# Patient Record
Sex: Female | Born: 1990 | Race: Black or African American | Hispanic: No | Marital: Single | Smoking: Never smoker
Health system: Southern US, Community
[De-identification: ages and names within clinical notes are randomized; demographics above are authoritative.]

## PROBLEM LIST (undated history)

## (undated) ENCOUNTER — Inpatient Hospital Stay (HOSPITAL_COMMUNITY): Payer: Self-pay

## (undated) DIAGNOSIS — T7840XA Allergy, unspecified, initial encounter: Secondary | ICD-10-CM

## (undated) DIAGNOSIS — O139 Gestational [pregnancy-induced] hypertension without significant proteinuria, unspecified trimester: Secondary | ICD-10-CM

## (undated) DIAGNOSIS — O09299 Supervision of pregnancy with other poor reproductive or obstetric history, unspecified trimester: Secondary | ICD-10-CM

## (undated) DIAGNOSIS — M1990 Unspecified osteoarthritis, unspecified site: Secondary | ICD-10-CM

## (undated) DIAGNOSIS — J45909 Unspecified asthma, uncomplicated: Secondary | ICD-10-CM

## (undated) DIAGNOSIS — O24419 Gestational diabetes mellitus in pregnancy, unspecified control: Secondary | ICD-10-CM

## (undated) DIAGNOSIS — G5602 Carpal tunnel syndrome, left upper limb: Secondary | ICD-10-CM

## (undated) DIAGNOSIS — E119 Type 2 diabetes mellitus without complications: Secondary | ICD-10-CM

## (undated) DIAGNOSIS — J302 Other seasonal allergic rhinitis: Secondary | ICD-10-CM

## (undated) DIAGNOSIS — A599 Trichomoniasis, unspecified: Secondary | ICD-10-CM

## (undated) DIAGNOSIS — Z8619 Personal history of other infectious and parasitic diseases: Secondary | ICD-10-CM

## (undated) HISTORY — DX: Supervision of pregnancy with other poor reproductive or obstetric history, unspecified trimester: O09.299

## (undated) HISTORY — DX: Allergy, unspecified, initial encounter: T78.40XA

## (undated) HISTORY — DX: Type 2 diabetes mellitus without complications: E11.9

## (undated) HISTORY — DX: Carpal tunnel syndrome, left upper limb: G56.02

---
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
## 2012-04-28 ENCOUNTER — Ambulatory Visit: Payer: 59 | Admitting: Internal Medicine

## 2012-04-29 ENCOUNTER — Ambulatory Visit (INDEPENDENT_AMBULATORY_CARE_PROVIDER_SITE_OTHER): Payer: 59 | Admitting: Family Medicine

## 2012-04-29 VITALS — BP 129/86 | HR 75 | Temp 98.0°F | Resp 18 | Ht 66.0 in | Wt 216.0 lb

## 2012-04-29 DIAGNOSIS — J45909 Unspecified asthma, uncomplicated: Secondary | ICD-10-CM | POA: Insufficient documentation

## 2012-04-29 MED ORDER — ALBUTEROL SULFATE (2.5 MG/3ML) 0.083% IN NEBU: 2.5000 mg | INHALATION_SOLUTION | Freq: Four times a day (QID) | RESPIRATORY_TRACT | Status: DC | PRN

## 2012-04-29 MED ORDER — ALBUTEROL SULFATE (2.5 MG/3ML) 0.083% IN NEBU
2.5000 mg | INHALATION_SOLUTION | Freq: Once | RESPIRATORY_TRACT | Status: AC
  Administered 2012-04-29: 2.5 mg via RESPIRATORY_TRACT

## 2012-04-29 MED ORDER — PREDNISONE 20 MG PO TABS: ORAL_TABLET | ORAL | Status: AC

## 2012-04-29 MED ORDER — ALBUTEROL SULFATE HFA 108 (90 BASE) MCG/ACT IN AERS: 2.0000 | INHALATION_SPRAY | Freq: Four times a day (QID) | RESPIRATORY_TRACT | Status: DC | PRN

## 2012-04-29 NOTE — Patient Instructions (Signed)
Asthma, Adult Asthma is caused by narrowing of the air passages in the lungs. It may be triggered by pollen, dust, animal dander, molds, some foods, respiratory infections, exposure to smoke, exercise, emotional stress or other allergens (things that cause allergic reactions or allergies). Repeat attacks are common. HOME CARE INSTRUCTIONS   Use prescription medications as ordered by your caregiver.   Avoid pollen, dust, animal dander, molds, smoke and other things that cause attacks at home and at work.   You may have fewer attacks if you decrease dust in your home. Electrostatic air cleaners may help.   It may help to replace your pillows or mattress with materials less likely to cause allergies.   Talk to your caregiver about an action plan for managing asthma attacks at home, including, the use of a peak flow meter which measures the severity of your asthma attack. An action plan can help minimize or stop the attack without having to seek medical care.   If you are not on a fluid restriction, drink 8 to 10 glasses of water each day.   Always have a plan prepared for seeking medical attention, including, calling your physician, accessing local emergency care, and calling 911 (in the [place] for a severe attack.   Discuss possible exercise routines with your caregiver.   If animal dander is the cause of asthma, you may need to get rid of pets.  SEEK MEDICAL CARE IF:   You have wheezing and shortness of breath even if taking medicine to prevent attacks.   You have muscle aches, chest pain or thickening of sputum.   Your sputum changes from clear or white to yellow, green, gray, or bloody.   You have any problems that may be related to the medicine you are taking (such as a rash, itching, swelling or trouble breathing).  SEEK IMMEDIATE MEDICAL CARE IF:   Your usual medicines do not stop your wheezing or there is increased coughing and/or shortness of breath.   You have increased  difficulty breathing.   You have a fever.  MAKE SURE YOU:   Understand these instructions.   Will watch your condition.   Will get help right away if you are not doing well or get worse.  Document Released: 11/04/2005 Document Revised: 10/24/2011 Document Reviewed: 06/22/2008 [facility] Patient Information 2012 [place], [place].  If you are doing well you do not need to return, but if you continue to have wheezing you need to come to be treated with some long-term medication.

## 2012-04-29 NOTE — Addendum Note (Signed)
Addended by: Mabeline Varas H on: 04/29/2012 02:07 PM   Modules accepted: Orders

## 2012-04-29 NOTE — Progress Notes (Signed)
Subjective: 21 year old lady who has been having some problems with her asthma. She had asthma when she was quite young. She has not really been on any major asthma treatments. She was doing initial evaluation for the far department with some testing which involved climbing stairs and pulling a body. She wheezing. GI pain she do for her left back around her chest this lasted a few seconds. Having off-and-on wheezing the past 2 days. Also some phlegm. She also has had a little rhinorrhea.  History of eczema  Objective: Mildly overweight Afro-American female. Throat clear. Neck supple without nodes. Chest is clear to auscultation. Heart regular without murmurs. On forced expiration there is a little decreased air movement. Peak flow was tested it should be 460 but she only 350.  Assessment: Asthma  Plan: Trial of albuterol nebulizer and we will see her breathing status  . No significant improvement of peak flow despite hand-held nebulizer treatment.  Will treat with albuterol and prednisone. If she is doing well she does not need to come back, but if she keeps having symptoms she is to come back for recheck.

## 2012-11-18 NOTE — L&D Delivery Note (Signed)
Patient was C/C/+2 and pushed for 10 minutes with epidural.   NSVD  female infant, Apgars 8,9, weight P.   The patient had 2 lacerations -one midline perineal and one small vaginal tear on L, both repaired with 2-0 vicryl R. Fundus was firm. EBL was expected. Placenta was delivered intact. Vagina was clear.  Baby was vigorous and doing skin to skin with mother.  Lonza Shimabukuro A

## 2013-02-27 ENCOUNTER — Inpatient Hospital Stay (HOSPITAL_COMMUNITY)
Admission: AD | Admit: 2013-02-27 | Discharge: 2013-02-27 | Disposition: A | Discharge status: Home / Self Care | Payer: Self-pay | Source: Ambulatory Visit | Attending: Obstetrics & Gynecology | Admitting: Obstetrics & Gynecology

## 2013-02-27 ENCOUNTER — Encounter (HOSPITAL_COMMUNITY): Payer: Self-pay | Admitting: *Deleted

## 2013-02-27 DIAGNOSIS — A5901 Trichomonal vulvovaginitis: Secondary | ICD-10-CM | POA: Insufficient documentation

## 2013-02-27 DIAGNOSIS — N390 Urinary tract infection, site not specified: Secondary | ICD-10-CM | POA: Insufficient documentation

## 2013-02-27 DIAGNOSIS — R109 Unspecified abdominal pain: Secondary | ICD-10-CM | POA: Insufficient documentation

## 2013-02-27 DIAGNOSIS — N949 Unspecified condition associated with female genital organs and menstrual cycle: Secondary | ICD-10-CM | POA: Insufficient documentation

## 2013-02-27 HISTORY — DX: Unspecified asthma, uncomplicated: J45.909

## 2013-02-27 LAB — URINE MICROSCOPIC-ADD ON

## 2013-02-27 LAB — URINALYSIS, ROUTINE W REFLEX MICROSCOPIC
Glucose, UA: NEGATIVE mg/dL
Hgb urine dipstick: NEGATIVE
Ketones, ur: 15 mg/dL — AB
Protein, ur: 100 mg/dL — AB

## 2013-02-27 LAB — WET PREP, GENITAL: Clue Cells Wet Prep HPF POC: NONE SEEN

## 2013-02-27 LAB — POCT PREGNANCY, URINE: Preg Test, Ur: NEGATIVE

## 2013-02-27 MED ORDER — METRONIDAZOLE 500 MG PO TABS
2000.0000 mg | ORAL_TABLET | Freq: Once | ORAL | Status: AC
  Administered 2013-02-27: 2000 mg via ORAL
  Filled 2013-02-27: qty 4

## 2013-02-27 NOTE — MAU Note (Signed)
Patient presents to MAU with c/o intermittent lower abdominal cramping x 2 weeks; progressively worse today. LMP 3/25. Denies vaginal bleeding.

## 2013-02-27 NOTE — MAU Provider Note (Signed)
None     Chief Complaint:  Abdominal Cramping for about 2 weeks.  Had unprotected intercourse 02/21/13 Pt is "just fine" with the possibility of being pregnant, declines birth control JEANINNE LODICO is  22 y.o. No obstetric history on file..  Patient's last menstrual period was 02/09/2013.Marland Kitchen  Her pregnancy status is negative.  She presents complaining of Abdominal Cramping Onset is described as insidious and has been present for  2 weeks     Past Medical History  Diagnosis Date  . Asthma     History reviewed. No pertinent past surgical history.  Family History  Problem Relation Age of Onset  . Arthritis Mother   . Diabetes Maternal Grandmother   . Arthritis Maternal Grandmother     History  Substance Use Topics  . Smoking status: Never Smoker   . Smokeless tobacco: Never Used  . Alcohol Use: No    Allergies: No Known Allergies  Prescriptions prior to admission  Medication Sig Dispense Refill  . albuterol (PROVENTIL HFA;VENTOLIN HFA) 108 (90 BASE) MCG/ACT inhaler Inhale 2 puffs into the lungs every 6 (six) hours as needed for wheezing.  1 Inhaler  2  . Multiple Vitamins-Minerals (MULTIVITAMIN PO) Take by mouth.         Review of Systems  Review of Systems  Constitutional: Negative for fever, chills, weight loss, malaise/fatigue and diaphoresis.  HENT: Negative for hearing loss, ear pain, nosebleeds, congestion, sore throat, neck pain, tinnitus and ear discharge.   Eyes: Negative for blurred vision, double vision, photophobia, pain, discharge and redness.  Respiratory: Negative for cough, hemoptysis, sputum production, shortness of breath, wheezing and stridor.   Cardiovascular: Negative for chest pain, palpitations, orthopnea,  leg swelling  Gastrointestinal: Positive for abdominal pain. Negative for heartburn, nausea, vomiting, diarrhea, constipation, blood in stool Genitourinary: Negative for dysuria,  hematuria and flank pain. Positive for urgency and  frequency Musculoskeletal: Negative for myalgias, back pain, joint pain and falls.  Skin: Negative for itching and rash.  Neurological: Negative for dizziness, tingling, tremors, sensory change, speech change, focal weakness, seizures, loss of consciousness, weakness and headaches.  Endo/Heme/Allergies: Negative for environmental allergies and polydipsia. Does not bruise/bleed easily.    Physical Exam   Blood pressure 127/82, pulse 95, temperature 98.6 F (37 C), temperature source Oral, resp. rate 18, height 5\' 5"  (1.651 m), weight 98.612 kg (217 lb 6.4 oz), last menstrual period 02/09/2013.  General: General appearance - alert, well appearing, and in no distress Chest - CTAB Heart - normal rate and regular rhythm Abdomen - soft, nontender, nondistended, no masses or organomegaly Pelvic - normal external genitalia, vulva, vagina, cervix, uterus and adnexa,  Thin frothy yellow vaginal discharge WET MOUNT done - results: trichomonads,  Extremities - normal   Labs: Results for orders placed during the hospital encounter of 02/27/13 (from the past 24 hour(s))  URINALYSIS, ROUTINE W REFLEX MICROSCOPIC   Collection Time    02/27/13  6:45 PM      Result Value Range   Color, Urine YELLOW  YELLOW   APPearance HAZY (*) CLEAR   Specific Gravity, Urine >1.030 (*) 1.005 - 1.030   pH 6.0  5.0 - 8.0   Glucose, UA NEGATIVE  NEGATIVE mg/dL   Hgb urine dipstick NEGATIVE  NEGATIVE   Bilirubin Urine NEGATIVE  NEGATIVE   Ketones, ur 15 (*) NEGATIVE mg/dL   Protein, ur 161 (*) NEGATIVE mg/dL   Urobilinogen, UA 0.2  0.0 - 1.0 mg/dL   Nitrite NEGATIVE  NEGATIVE  Leukocytes, UA MODERATE (*) NEGATIVE  URINE MICROSCOPIC-ADD ON   Collection Time    02/27/13  6:45 PM      Result Value Range   Squamous Epithelial / LPF MANY (*) RARE   WBC, UA 21-50  <3 WBC/hpf   RBC / HPF 3-6  <3 RBC/hpf   Bacteria, UA MANY (*) RARE  POCT PREGNANCY, URINE   Collection Time    02/27/13  7:01 PM      Result Value  Range   Preg Test, Ur NEGATIVE  NEGATIVE  WET PREP, GENITAL   Collection Time    02/27/13  7:15 PM      Result Value Range   Yeast Wet Prep HPF POC NONE SEEN  NONE SEEN   Trich, Wet Prep FEW (*) NONE SEEN   Clue Cells Wet Prep HPF POC NONE SEEN  NONE SEEN   WBC, Wet Prep HPF POC MANY (*) NONE SEEN   Imaging Studies:  No results found.   Assessment: Trichomonas  UTI vs vaginal contamination  Plan: Treat trich; partner to seek treatment Culture urine Pt to take pregnancy test if she misses her next period  CRESENZO-DISHMAN,Moussa Wiegand

## 2013-02-27 NOTE — MAU Note (Signed)
Pt reports having abd pain and cramping x 2 weeks. Denies vag bleeding or discharge. Not taking any pain medication.

## 2013-03-01 LAB — URINE CULTURE

## 2013-03-01 LAB — GC/CHLAMYDIA PROBE AMP: CT Probe RNA: NEGATIVE

## 2013-03-03 NOTE — MAU Provider Note (Signed)
Attestation of Attending Supervision of Advanced Practitioner (CNM/NP): Evaluation and management procedures were performed by the Advanced Practitioner under my supervision and collaboration. I have reviewed the Advanced Practitioner's note and chart, and I agree with the management and plan.  Jamira Barfuss H. 9:46 AM   

## 2013-03-17 ENCOUNTER — Inpatient Hospital Stay (HOSPITAL_COMMUNITY)
Admission: AD | Admit: 2013-03-17 | Discharge: 2013-03-17 | Disposition: A | Discharge status: Home / Self Care | Payer: Self-pay | Source: Ambulatory Visit | Attending: Obstetrics & Gynecology | Admitting: Obstetrics & Gynecology

## 2013-03-17 ENCOUNTER — Encounter (HOSPITAL_COMMUNITY): Payer: Self-pay

## 2013-03-17 DIAGNOSIS — A5901 Trichomonal vulvovaginitis: Secondary | ICD-10-CM | POA: Insufficient documentation

## 2013-03-17 DIAGNOSIS — A599 Trichomoniasis, unspecified: Secondary | ICD-10-CM

## 2013-03-17 DIAGNOSIS — Z3201 Encounter for pregnancy test, result positive: Secondary | ICD-10-CM

## 2013-03-17 DIAGNOSIS — O98819 Other maternal infectious and parasitic diseases complicating pregnancy, unspecified trimester: Secondary | ICD-10-CM | POA: Insufficient documentation

## 2013-03-17 HISTORY — DX: Unspecified osteoarthritis, unspecified site: M19.90

## 2013-03-17 HISTORY — DX: Trichomoniasis, unspecified: A59.9

## 2013-03-17 LAB — URINALYSIS, ROUTINE W REFLEX MICROSCOPIC
Bilirubin Urine: NEGATIVE
Glucose, UA: NEGATIVE mg/dL
Ketones, ur: 15 mg/dL — AB
Protein, ur: 100 mg/dL — AB
Specific Gravity, Urine: >1.03 — ABNORMAL HIGH (ref 1.005–1.030)
Urobilinogen, UA: 0.2 mg/dL (ref 0.0–1.0)

## 2013-03-17 MED ORDER — METRONIDAZOLE 500 MG PO TABS
2000.0000 mg | ORAL_TABLET | Freq: Once | ORAL | Status: AC
  Administered 2013-03-17: 2000 mg via ORAL
  Filled 2013-03-17: qty 4

## 2013-03-17 NOTE — MAU Provider Note (Signed)
[name] is a 22 y.o. G1P0 at [redacted]w[redacted]d who presents to MAU today for pregnancy confirmation. She denies pain or bleeding. LMP 02/09/13  BP 127/78  Pulse 97  Temp(Src) 98.5 F (36.9 C) (Oral)  Resp 16  Ht 5' 6.5" (1.689 m)  Wt 220 lb 6.4 oz (99.973 kg)  BMI 35.04 kg/m2  SpO2 100%  LMP 02/09/2013 GENERAL: Well-developed, well-nourished female in no acute distress.  HEENT: Normocephalic, atraumatic.   LUNGS: Effort normal HEART: Regular rate  SKIN: Warm, dry and without erythema PSYCH: Normal mood and affect  Results for orders placed during the hospital encounter of 03/17/13 (from the past 24 hour(s))  URINALYSIS, ROUTINE W REFLEX MICROSCOPIC     Status: Abnormal   Collection Time    03/17/13  3:46 PM      Result Value Range   Color, Urine YELLOW  YELLOW   APPearance CLOUDY (*) CLEAR   Specific Gravity, Urine >1.030 (*) 1.005 - 1.030   pH 6.0  5.0 - 8.0   Glucose, UA NEGATIVE  NEGATIVE mg/dL   Hgb urine dipstick TRACE (*) NEGATIVE   Bilirubin Urine NEGATIVE  NEGATIVE   Ketones, ur 15 (*) NEGATIVE mg/dL   Protein, ur 409 (*) NEGATIVE mg/dL   Urobilinogen, UA 0.2  0.0 - 1.0 mg/dL   Nitrite NEGATIVE  NEGATIVE   Leukocytes, UA MODERATE (*) NEGATIVE  URINE MICROSCOPIC-ADD ON     Status: Abnormal   Collection Time    03/17/13  3:46 PM      Result Value Range   Squamous Epithelial / LPF MANY (*) RARE   WBC, UA 21-50  <3 WBC/hpf   RBC / HPF 3-6  <3 RBC/hpf   Bacteria, UA MANY (*) RARE   Urine-Other TRICHOMONAS PRESENT    POCT PREGNANCY, URINE     Status: Abnormal   Collection Time    03/17/13  3:53 PM      Result Value Range   Preg Test, Ur POSITIVE (*) NEGATIVE    MDM 2 g Flagyl here for trichomonas. Patient states that partner was recently treated and that she had been previously as well. Discussed need to abstain from intercourse until both treated appropriately and at least one additional week.  UPT positive  A: Positive pregnancy  test Trichomonas  P: Discharge home Pregnancy confirmation given with list of OB providers Bleeding precautions discussed Patient may return to MAU as needed or if her condition were to change or worsen  Freddi Starr, PA-C 03/17/2013 4:34 PM

## 2013-03-17 NOTE — MAU Note (Signed)
Patient states she has had 2 positive home pregnancy tests and wants confirmation and a letter. Patient denies any problems, pain or bleeding.

## 2013-03-18 LAB — URINE CULTURE

## 2013-03-22 NOTE — MAU Provider Note (Signed)
Attestation of Attending Supervision of Advanced Practitioner (CNM/NP): Evaluation and management procedures were performed by the Advanced Practitioner under my supervision and collaboration. I have reviewed the Advanced Practitioner's note and chart, and I agree with the management and plan.  Hayzel Ruberg H. 3:28 PM

## 2013-04-13 ENCOUNTER — Inpatient Hospital Stay (HOSPITAL_COMMUNITY): Payer: Medicaid Other

## 2013-04-13 ENCOUNTER — Encounter (HOSPITAL_COMMUNITY): Payer: Self-pay | Admitting: Obstetrics and Gynecology

## 2013-04-13 ENCOUNTER — Inpatient Hospital Stay (HOSPITAL_COMMUNITY)
Admission: AD | Admit: 2013-04-13 | Discharge: 2013-04-13 | Disposition: A | Discharge status: Home / Self Care | Payer: Medicaid Other | Source: Ambulatory Visit | Attending: Obstetrics & Gynecology | Admitting: Obstetrics & Gynecology

## 2013-04-13 DIAGNOSIS — R141 Gas pain: Secondary | ICD-10-CM | POA: Insufficient documentation

## 2013-04-13 DIAGNOSIS — R142 Eructation: Secondary | ICD-10-CM | POA: Insufficient documentation

## 2013-04-13 DIAGNOSIS — O9989 Other specified diseases and conditions complicating pregnancy, childbirth and the puerperium: Secondary | ICD-10-CM

## 2013-04-13 DIAGNOSIS — O99891 Other specified diseases and conditions complicating pregnancy: Secondary | ICD-10-CM | POA: Insufficient documentation

## 2013-04-13 DIAGNOSIS — R140 Abdominal distension (gaseous): Secondary | ICD-10-CM

## 2013-04-13 DIAGNOSIS — O210 Mild hyperemesis gravidarum: Secondary | ICD-10-CM | POA: Insufficient documentation

## 2013-04-13 DIAGNOSIS — O26899 Other specified pregnancy related conditions, unspecified trimester: Secondary | ICD-10-CM

## 2013-04-13 DIAGNOSIS — R109 Unspecified abdominal pain: Secondary | ICD-10-CM | POA: Insufficient documentation

## 2013-04-13 LAB — URINE MICROSCOPIC-ADD ON

## 2013-04-13 LAB — CBC
HCT: 36 % (ref 36.0–46.0)
MCH: 25.2 pg — ABNORMAL LOW (ref 26.0–34.0)
MCV: 73.8 fL — ABNORMAL LOW (ref 78.0–100.0)
Platelets: 306 10*3/uL (ref 150–400)
RDW: 15.7 % — ABNORMAL HIGH (ref 11.5–15.5)

## 2013-04-13 LAB — URINALYSIS, ROUTINE W REFLEX MICROSCOPIC
Leukocytes, UA: NEGATIVE
Nitrite: NEGATIVE
Specific Gravity, Urine: >1.03 — ABNORMAL HIGH (ref 1.005–1.030)

## 2013-04-13 NOTE — MAU Note (Signed)
Pt states she ate chicfilet at 1130 and has been having cramping since then-has had nausea but no vomiting-no nausea at present just cramping

## 2013-04-13 NOTE — MAU Provider Note (Signed)
Chief Complaint:  Abdominal Cramping  First Provider Initiated Contact with Patient 04/15/13 0147     HPI: Denise [name] is a 22 y.o. G1P0 at [redacted]w[redacted]d by LMP who presents to maternity admissions reporting moderate low abdominal cramping and nausea starting after lunch. Also reports upper abdominal fullness and intermittent pain. Denies fever, chills, urinary complaints, vomiting, diarrhea, constipation, vaginal discharge or vaginal bleeding. IUP not yet confirmed. Has no OB visit scheduled in low risk clinic next week.  Past Medical History: Past Medical History  Diagnosis Date  . Asthma   . Trichomonas   . Arthritis     Past obstetric history: OB History   Grav Para Term Preterm Abortions TAB SAB Ect Mult Living   1              # Outc Date GA Lbr Len/2nd Wgt Sex Del Anes PTL Lv   1 CUR               Past Surgical History: No past surgical history on file.  Family History: Family History  Problem Relation Age of Onset  . Arthritis Mother   . Diabetes Maternal Grandmother   . Arthritis Maternal Grandmother     Social History: History  Substance Use Topics  . Smoking status: Never Smoker   . Smokeless tobacco: Never Used  . Alcohol Use: No    Allergies: No Known Allergies  Meds:  Prescriptions prior to admission  Medication Sig Dispense Refill  . albuterol (PROVENTIL HFA;VENTOLIN HFA) 108 (90 BASE) MCG/ACT inhaler Inhale 2 puffs into the lungs every 6 (six) hours as needed for wheezing.  1 Inhaler  2  . Prenatal Vit-Fe Fumarate-FA (PRENATAL MULTIVITAMIN) TABS Take 1 tablet by mouth daily at 12 noon.        ROS: Pertinent findings in history of present illness.  Physical Exam  Blood pressure 159/87, pulse 90, resp. rate 16, height 5\' 6"  (1.676 m), weight 99.565 kg (219 lb 8 oz), last menstrual period 02/09/2013, SpO2 100.00%. GENERAL: Well-developed, well-nourished female in no acute distress.  HEENT: normocephalic HEART: normal rate RESP: normal  effort ABDOMEN: Soft, non-tender, fundus not palpable. EXTREMITIES: Nontender, no edema NEURO: alert and oriented SPECULUM EXAM: Declined.     Labs: Results for orders placed during the hospital encounter of 04/13/13 (from the past 24 hour(s))  URINALYSIS, ROUTINE W REFLEX MICROSCOPIC     Status: Abnormal   Collection Time    04/13/13  8:45 PM      Result Value Range   Color, Urine YELLOW  YELLOW   APPearance CLEAR  CLEAR   Specific Gravity, Urine >1.030 (*) 1.005 - 1.030   pH 6.0  5.0 - 8.0   Glucose, UA NEGATIVE  NEGATIVE mg/dL   Hgb urine dipstick NEGATIVE  NEGATIVE   Bilirubin Urine NEGATIVE  NEGATIVE   Ketones, ur 15 (*) NEGATIVE mg/dL   Protein, ur 811 (*) NEGATIVE mg/dL   Urobilinogen, UA 0.2  0.0 - 1.0 mg/dL   Nitrite NEGATIVE  NEGATIVE   Leukocytes, UA NEGATIVE  NEGATIVE  URINE MICROSCOPIC-ADD ON     Status: None   Collection Time    04/13/13  8:45 PM      Result Value Range   Squamous Epithelial / LPF RARE  RARE   WBC, UA 0-2  <3 WBC/hpf   RBC / HPF 0-2  <3 RBC/hpf   Bacteria, UA RARE  RARE  ABO/RH     Status: None   Collection Time  04/13/13  9:15 PM      Result Value Range   ABO/RH(D) B POS    CBC     Status: Abnormal   Collection Time    04/13/13  9:15 PM      Result Value Range   WBC 6.5  4.0 - 10.5 K/uL   RBC 4.88  3.87 - 5.11 MIL/uL   Hemoglobin 12.3  12.0 - 15.0 g/dL   HCT 16.1  09.6 - 04.5 %   MCV 73.8 (*) 78.0 - 100.0 fL   MCH 25.2 (*) 26.0 - 34.0 pg   MCHC 34.2  30.0 - 36.0 g/dL   RDW 40.9 (*) 81.1 - 91.4 %   Platelets 306  150 - 400 K/uL  HCG, QUANTITATIVE, PREGNANCY     Status: Abnormal   Collection Time    04/13/13  9:15 PM      Result Value Range   hCG, Beta Chain, Quant, S 78295 (*) <5 mIU/mL    Imaging:  9 week live SIUP. Fetal heart rate 174.  MAU Course: Pain resolved spontaneously after [place]. Requesting to defer pelvic exam until no OB appointment 04/21/2013.  Assessment: 1. Abdominal pain complicating pregnancy, antepartum    2. Bloating    Plan: Discharge home in stable condition. Increase fluids and avoid fast food.     Follow-up Information   Follow up with [facility] On 04/21/2013.   Contact information:   [address] [place] [address] [phone]      Follow up with [facility] [place] MATERNITY ADMISSIONS. (As needed if symptoms worsen)    Contact information:   [address] [id number] [place] [address] [phone]       Medication List    TAKE these medications       albuterol 108 (90 BASE) MCG/ACT inhaler  Commonly known as:  PROVENTIL HFA;VENTOLIN HFA  Inhale 2 puffs into the lungs every 6 (six) hours as needed for wheezing.     prenatal multivitamin Tabs  Take 1 tablet by mouth daily at 12 noon.       [place], [place] 04/13/2013 10:56 PM

## 2013-04-15 DIAGNOSIS — A5901 Trichomonal vulvovaginitis: Secondary | ICD-10-CM | POA: Insufficient documentation

## 2013-04-21 ENCOUNTER — Encounter: Payer: Self-pay | Admitting: Advanced Practice Midwife

## 2013-04-21 ENCOUNTER — Ambulatory Visit (INDEPENDENT_AMBULATORY_CARE_PROVIDER_SITE_OTHER): Payer: Medicaid Other | Admitting: Advanced Practice Midwife

## 2013-04-21 VITALS — BP 131/79 | Temp 98.7°F | Wt 218.8 lb

## 2013-04-21 DIAGNOSIS — Z3480 Encounter for supervision of other normal pregnancy, unspecified trimester: Secondary | ICD-10-CM

## 2013-04-21 DIAGNOSIS — Z3491 Encounter for supervision of normal pregnancy, unspecified, first trimester: Secondary | ICD-10-CM

## 2013-04-21 LAB — POCT URINALYSIS DIP (DEVICE)
Glucose, UA: NEGATIVE mg/dL
Leukocytes, UA: NEGATIVE
Protein, ur: 100 mg/dL — AB
Specific Gravity, Urine: >=1.03 (ref 1.005–1.030)
Urobilinogen, UA: 0.2 mg/dL (ref 0.0–1.0)

## 2013-04-21 NOTE — Progress Notes (Signed)
P-90 

## 2013-04-21 NOTE — Progress Notes (Signed)
I have seen the patient with the resident/student and agree with the above.  [place]ogan, Heather Donovan  

## 2013-04-21 NOTE — Progress Notes (Signed)
   Subjective:    Denise [name] is a G1P0 [redacted]w[redacted]d being seen today for her first obstetrical visit.  Her obstetrical history is significant for obesity. Patient  Pregnancy history fully reviewed.  Patient reports no complaints.  Filed Vitals:   04/21/13 1502  BP: 131/79  Temp: 98.7 F (37.1 C)  Weight: 218 lb 12.8 oz (99.247 kg)    HISTORY: OB History   Grav Para Term Preterm Abortions TAB SAB Ect Mult Living   1              # Outc Date GA Lbr Len/2nd Wgt Sex Del Anes PTL Lv   1 CUR              Past Medical History  Diagnosis Date  . Asthma   . Trichomonas   . Arthritis    History reviewed. No pertinent past surgical history. Family History  Problem Relation Age of Onset  . Arthritis Mother   . Diabetes Maternal Grandmother   . Arthritis Maternal Grandmother      Exam    Uterus:  Fundal Height: 10 cm  Pelvic Exam:    Perineum: Normal Perineum   Vulva: normal   Vagina:  normal mucosa   pH:    Cervix: no cervical motion tenderness   Adnexa: normal adnexa   Bony Pelvis: gynecoid  System: Breast:  normal appearance, no masses or tenderness   Skin: Acanthosis nigricans axillae,back of neck    Neurologic: oriented, normal, normal mood, gait normal; reflexes normal and symmetric   Extremities: normal strength, tone, and muscle mass, no deformities, no erythema, induration, or nodules, ROM of all joints is normal, no evidence of joint instability, gait was normal for age, joint mobility appears intact   HEENT thyroid without masses   Mouth/Teeth mucous membranes moist, pharynx normal without lesions   Neck no masses   Cardiovascular: regular rate and rhythm, no murmurs or gallops   Respiratory:  appears well, vitals normal, no respiratory distress, acyanotic, normal RR, ear and throat exam is normal, neck free of mass or lymphadenopathy, chest clear, no wheezing, crepitations, rhonchi, normal symmetric air entry   Abdomen: soft, non-tender; bowel sounds normal;  no masses,  no organomegaly   Urinary: urethral meatus normal      Assessment:    Pregnancy: G1P0 Patient Active Problem List   Diagnosis Date Noted  . Trichomonas vaginitis 04/15/2013  . Asthma 04/29/2012        Plan:     Initial labs drawn. Prenatal vitamins. Problem list reviewed and updated.  Follow up in 4 weeks. 75% of 45 min visit spent on counseling and coordination of care.    Lavonda Jumbo A 04/21/2013

## 2013-04-21 NOTE — Addendum Note (Signed)
Addended by: Thressa Sheller D on: 04/21/2013 04:44 PM   Modules accepted: Orders

## 2013-04-23 LAB — CULTURE, OB URINE
Colony Count: NO GROWTH
Organism ID, Bacteria: NO GROWTH

## 2013-05-19 ENCOUNTER — Encounter: Payer: Self-pay | Admitting: Advanced Practice Midwife

## 2013-05-19 ENCOUNTER — Other Ambulatory Visit: Payer: Self-pay | Admitting: *Deleted

## 2013-05-19 ENCOUNTER — Ambulatory Visit (INDEPENDENT_AMBULATORY_CARE_PROVIDER_SITE_OTHER): Payer: Medicaid Other | Admitting: Advanced Practice Midwife

## 2013-05-19 VITALS — BP 144/93 | Wt 218.3 lb

## 2013-05-19 DIAGNOSIS — F40298 Other specified phobia: Secondary | ICD-10-CM | POA: Insufficient documentation

## 2013-05-19 DIAGNOSIS — O99519 Diseases of the respiratory system complicating pregnancy, unspecified trimester: Secondary | ICD-10-CM

## 2013-05-19 DIAGNOSIS — Z3480 Encounter for supervision of other normal pregnancy, unspecified trimester: Secondary | ICD-10-CM

## 2013-05-19 DIAGNOSIS — Z3492 Encounter for supervision of normal pregnancy, unspecified, second trimester: Secondary | ICD-10-CM

## 2013-05-19 DIAGNOSIS — Z3490 Encounter for supervision of normal pregnancy, unspecified, unspecified trimester: Secondary | ICD-10-CM | POA: Insufficient documentation

## 2013-05-19 LAB — POCT URINALYSIS DIP (DEVICE)
Bilirubin Urine: NEGATIVE
Glucose, UA: NEGATIVE mg/dL
Hgb urine dipstick: NEGATIVE
Leukocytes, UA: NEGATIVE
Nitrite: NEGATIVE
Urobilinogen, UA: 0.2 mg/dL (ref 0.0–1.0)

## 2013-05-19 MED ORDER — ALBUTEROL SULFATE HFA 108 (90 BASE) MCG/ACT IN AERS: 2.0000 | INHALATION_SPRAY | Freq: Four times a day (QID) | RESPIRATORY_TRACT | Status: DC | PRN

## 2013-05-19 NOTE — Progress Notes (Signed)
Pulse: 83 Pt wants quad screen, informed her that we can do it at her next visit. She is too early today.
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
## 2013-05-19 NOTE — Patient Instructions (Signed)
Pregnancy - Second Trimester The second trimester of pregnancy (3 to 6 months) is a period of rapid growth for you and your baby. At the end of the sixth month, your baby is about 9 inches long and weighs 1 1/2 pounds. You will begin to feel the baby move between 18 and 20 weeks of the pregnancy. This is called quickening. Weight gain is faster. A clear fluid (colostrum) may leak out of your breasts. You may feel small contractions of the womb (uterus). This is known as false labor or Braxton-Hicks contractions. This is like a practice for labor when the baby is ready to be born. Usually, the problems with morning sickness have usually passed by the end of your first trimester. Some women develop small dark blotches (called cholasma, mask of pregnancy) on their face that usually goes away after the baby is born. Exposure to the sun makes the blotches worse. Acne may also develop in some pregnant women and pregnant women who have acne, may find that it goes away. PRENATAL EXAMS  Blood work may continue to be done during prenatal exams. These tests are done to check on your health and the probable health of your baby. Blood work is used to follow your blood levels (hemoglobin). Anemia (low hemoglobin) is common during pregnancy. Iron and vitamins are given to help prevent this. You will also be checked for diabetes between 24 and 28 weeks of the pregnancy. Some of the previous blood tests may be repeated.  The size of the uterus is measured during each visit. This is to make sure that the baby is continuing to grow properly according to the dates of the pregnancy.  Your blood pressure is checked every prenatal visit. This is to make sure you are not getting toxemia.  Your urine is checked to make sure you do not have an infection, diabetes or protein in the urine.  Your weight is checked often to make sure gains are happening at the suggested rate. This is to ensure that both you and your baby are  growing normally.  Sometimes, an ultrasound is performed to confirm the proper growth and development of the baby. This is a test which bounces harmless sound waves off the baby so your caregiver can more accurately determine due dates. Sometimes, a test is done on the amniotic fluid surrounding the baby. This test is called an amniocentesis. The amniotic fluid is obtained by sticking a needle into the belly (abdomen). This is done to check the chromosomes in instances where there is a concern about possible genetic problems with the baby. It is also sometimes done near the end of pregnancy if an early delivery is required. In this case, it is done to help make sure the baby's lungs are mature enough for the baby to live outside of the womb. CHANGES OCCURING IN THE SECOND TRIMESTER OF PREGNANCY Your body goes through many changes during pregnancy. They vary from person to person. Talk to your caregiver about changes you notice that you are concerned about.  During the second trimester, you will likely have an increase in your appetite. It is normal to have cravings for certain foods. This varies from person to person and pregnancy to pregnancy.  Your lower abdomen will begin to bulge.  You may have to urinate more often because the uterus and baby are pressing on your bladder. It is also common to get more bladder infections during pregnancy. You can help this by drinking lots of fluids   and emptying your bladder before and after intercourse.  You may begin to get stretch marks on your hips, abdomen, and breasts. These are normal changes in the body during pregnancy. There are no exercises or medicines to take that prevent this change.  You may begin to develop swollen and bulging veins (varicose veins) in your legs. Wearing support hose, elevating your feet for 15 minutes, 3 to 4 times a day and limiting salt in your diet helps lessen the problem.  Heartburn may develop as the uterus grows and  pushes up against the stomach. Antacids recommended by your caregiver helps with this problem. Also, eating smaller meals 4 to 5 times a day helps.  Constipation can be treated with a stool softener or adding bulk to your diet. Drinking lots of fluids, and eating vegetables, fruits, and whole grains are helpful.  Exercising is also helpful. If you have been very active up until your pregnancy, most of these activities can be continued during your pregnancy. If you have been less active, it is helpful to start an exercise program such as walking.  Hemorrhoids may develop at the end of the second trimester. Warm sitz baths and hemorrhoid cream recommended by your caregiver helps hemorrhoid problems.  Backaches may develop during this time of your pregnancy. Avoid heavy lifting, wear low heal shoes, and practice good posture to help with backache problems.  Some pregnant women develop tingling and numbness of their hand and fingers because of swelling and tightening of ligaments in the wrist (carpel tunnel syndrome). This goes away after the baby is born.  As your breasts enlarge, you may have to get a bigger bra. Get a comfortable, cotton, support bra. Do not get a nursing bra until the last month of the pregnancy if you will be nursing the baby.  You may get a dark line from your belly button to the pubic area called the linea nigra.  You may develop rosy cheeks because of increase blood flow to the face.  You may develop spider looking lines of the face, neck, arms, and chest. These go away after the baby is born. HOME CARE INSTRUCTIONS   It is extremely important to avoid all smoking, herbs, alcohol, and unprescribed drugs during your pregnancy. These chemicals affect the formation and growth of the baby. Avoid these chemicals throughout the pregnancy to ensure the delivery of a healthy infant.  Most of your home care instructions are the same as suggested for the first trimester of your  pregnancy. Keep your caregiver's appointments. Follow your caregiver's instructions regarding medicine use, exercise, and diet.  During pregnancy, you are providing food for you and your baby. Continue to eat regular, well-balanced meals. Choose foods such as meat, fish, milk and other low fat dairy products, vegetables, fruits, and whole-grain breads and cereals. Your caregiver will tell you of the ideal weight gain.  A physical sexual relationship may be continued up until near the end of pregnancy if there are no other problems. Problems could include early (premature) leaking of amniotic fluid from the membranes, vaginal bleeding, abdominal pain, or other medical or pregnancy problems.  Exercise regularly if there are no restrictions. Check with your caregiver if you are unsure of the safety of some of your exercises. The greatest weight gain will occur in the last 2 trimesters of pregnancy. Exercise will help you:  Control your weight.  Get you in shape for labor and delivery.  Lose weight after you have the baby.  Wear   a good support or jogging bra for breast tenderness during pregnancy. This may help if worn during sleep. Pads or tissues may be used in the bra if you are leaking colostrum.  Do not use hot tubs, steam rooms or saunas throughout the pregnancy.  Wear your seat belt at all times when driving. This protects you and your baby if you are in an accident.  Avoid raw meat, uncooked cheese, cat litter boxes, and soil used by cats. These carry germs that can cause birth defects in the baby.  The second trimester is also a good time to visit your dentist for your dental health if this has not been done yet. Getting your teeth cleaned is okay. Use a soft toothbrush. Brush gently during pregnancy.  It is easier to leak urine during pregnancy. Tightening up and strengthening the pelvic muscles will help with this problem. Practice stopping your urination while you are going to the  bathroom. These are the same muscles you need to strengthen. It is also the muscles you would use as if you were trying to stop from passing gas. You can practice tightening these muscles up 10 times a set and repeating this about 3 times per day. Once you know what muscles to tighten up, do not perform these exercises during urination. It is more likely to contribute to an infection by backing up the urine.  Ask for help if you have financial, counseling, or nutritional needs during pregnancy. Your caregiver will be able to offer counseling for these needs as well as refer you for other special needs.  Your skin may become oily. If so, wash your face with mild soap, use non-greasy moisturizer and oil or cream based makeup. MEDICINES AND DRUG USE IN PREGNANCY  Take prenatal vitamins as directed. The vitamin should contain 1 milligram of folic acid. Keep all vitamins out of reach of children. Only a couple vitamins or tablets containing iron may be fatal to a baby or young child when ingested.  Avoid use of all medicines, including herbs, over-the-counter medicines, not prescribed or suggested by your caregiver. Only take over-the-counter or prescription medicines for pain, discomfort, or fever as directed by your caregiver. Do not use aspirin.  Let your caregiver also know about herbs you may be using.  Alcohol is related to a number of birth defects. This includes fetal alcohol syndrome. All alcohol, in any form, should be avoided completely. Smoking will cause low birth rate and premature babies.  Street or illegal drugs are very harmful to the baby. They are absolutely forbidden. A baby born to an addicted mother will be addicted at birth. The baby will go through the same withdrawal an adult does. SEEK MEDICAL CARE IF:  You have any concerns or worries during your pregnancy. It is better to call with your questions if you feel they cannot wait, rather than worry about them. SEEK IMMEDIATE  MEDICAL CARE IF:   An unexplained oral temperature above 102 F (38.9 C) develops, or as your caregiver suggests.  You have leaking of fluid from the vagina (birth canal). If leaking membranes are suspected, take your temperature and tell your caregiver of this when you call.  There is vaginal spotting, bleeding, or passing clots. Tell your caregiver of the amount and how many pads are used. Light spotting in pregnancy is common, especially following intercourse.  You develop a bad smelling vaginal discharge with a change in the color from clear to white.  You continue to feel   sick to your stomach (nauseated) and have no relief from remedies suggested. You vomit blood or coffee ground-like materials.  You lose more than 2 pounds of weight or gain more than 2 pounds of weight over 1 week, or as suggested by your caregiver.  You notice swelling of your face, hands, feet, or legs.  You get exposed to German measles and have never had them.  You are exposed to fifth disease or chickenpox.  You develop belly (abdominal) pain. Round ligament discomfort is a common non-cancerous (benign) cause of abdominal pain in pregnancy. Your caregiver still must evaluate you.  You develop a bad headache that does not go away.  You develop fever, diarrhea, pain with urination, or shortness of breath.  You develop visual problems, blurry, or double vision.  You fall or are in a car accident or any kind of trauma.  There is mental or physical violence at home. Document Released: 10/29/2001 Document Revised: 07/29/2012 Document Reviewed: 05/03/2009 [employer] Patient Information 2014 [employer].  

## 2013-06-15 ENCOUNTER — Observation Stay (HOSPITAL_COMMUNITY)
Admission: RE | Admit: 2013-06-15 | Discharge: 2013-06-15 | Disposition: A | Discharge status: Home / Self Care | Payer: Medicaid Other | Source: Ambulatory Visit | Attending: Advanced Practice Midwife | Admitting: Advanced Practice Midwife

## 2013-06-15 DIAGNOSIS — Z3492 Encounter for supervision of normal pregnancy, unspecified, second trimester: Secondary | ICD-10-CM

## 2013-06-15 DIAGNOSIS — O358XX0 Maternal care for other (suspected) fetal abnormality and damage, not applicable or unspecified: Secondary | ICD-10-CM | POA: Insufficient documentation

## 2013-06-15 DIAGNOSIS — Z1389 Encounter for screening for other disorder: Secondary | ICD-10-CM | POA: Insufficient documentation

## 2013-06-15 DIAGNOSIS — Z363 Encounter for antenatal screening for malformations: Secondary | ICD-10-CM | POA: Insufficient documentation

## 2013-06-16 ENCOUNTER — Ambulatory Visit (INDEPENDENT_AMBULATORY_CARE_PROVIDER_SITE_OTHER): Payer: Medicaid Other | Admitting: Obstetrics and Gynecology

## 2013-06-16 ENCOUNTER — Encounter: Payer: Self-pay | Admitting: Obstetrics and Gynecology

## 2013-06-16 VITALS — BP 117/79 | Temp 98.7°F | Wt 220.4 lb

## 2013-06-16 DIAGNOSIS — J4520 Mild intermittent asthma, uncomplicated: Secondary | ICD-10-CM

## 2013-06-16 DIAGNOSIS — J45909 Unspecified asthma, uncomplicated: Secondary | ICD-10-CM

## 2013-06-16 DIAGNOSIS — Z3492 Encounter for supervision of normal pregnancy, unspecified, second trimester: Secondary | ICD-10-CM

## 2013-06-16 LAB — POCT URINALYSIS DIP (DEVICE)
Bilirubin Urine: NEGATIVE
Glucose, UA: NEGATIVE mg/dL
Ketones, ur: NEGATIVE mg/dL
Nitrite: NEGATIVE

## 2013-06-16 NOTE — Progress Notes (Signed)
Pulse- 80 

## 2013-06-16 NOTE — Progress Notes (Signed)
   Subjective:    Denise [name] is a 22 y.o. female being seen today for her obstetrical visit. She is at [redacted]w[redacted]d gestation. Patient reports no bleeding, no contractions, no cramping and no leaking. Fetal movement: normal.  Menstrual History: OB History   Grav Para Term Preterm Abortions TAB SAB Ect Mult Living   1                Patient's last menstrual period was 02/09/2013.    The following portions of the patient's history were reviewed and updated as appropriate: allergies, current medications, past family history, past medical history, past social history, past surgical history and problem list.  Review of Systems Pertinent items are noted in HPI.   Objective:     BP 117/79  Temp(Src) 98.7 F (37.1 C)  Wt 220 lb 6.4 oz (99.973 kg)  BMI 35.59 kg/m2  LMP 02/09/2013 Uterine Size: size equals dates  FHT: 140                                         Assessment:   Denise [name] is a 22 y.o. G1P0 at [redacted]w[redacted]d  presents for ROB  Discussed with Patient:  - declined quad screen, reviewed images, requires imaging for cardiac views - Patient plans on breast feeding. - Routine precautions discussed (depression, infection s/s).   Patient provided with all pertinent phone numbers for emergencies. - RTC for any VB, regular, painful cramps/ctxs occurring at a rate of >2/10 min, fever (100.5 or higher), n/v/d, any pain that is unresolving or worsening. - RTC in 4 weeks for next appt.  Problems: Patient Active Problem List   Diagnosis Date Noted  . Supervision of normal pregnancy 05/19/2013  . 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 Trichomonas vaginitis 04/15/2013  . Asthma 04/29/2012    To Do: 1. Call and schedule next [place]  [ ]  Vaccines: Flu:  Tdap:  [ ]  BCM: undecided  Edu: [x]  PTL precautions; [x ] BF class; [ x] childbirth class; [ ]   BF counseling

## 2013-06-16 NOTE — Patient Instructions (Signed)
Your BMI is Body mass index is 35.59 kg/(m^2).Marland Kitchen The Academy of Obstetrics and Gynecology have recommendations for weight gain during pregnancy. Below is the recommendations. Please refer to your BMI and help maintain your weight.  BMI: Body mass index is 35.59 kg/(m^2). Table 1. Institute of Medicine Weight Gain Recommendations for Pregnancy  Prepregnancy Weight  Category Body Mass Index* Recommended  Range of  Total Weight (lb) Recommended Rates of Weight Gain? in the Second and Third Trimesters (lb) (Mean Range [lb/wk])  Underweight Less than 18.5 28-40 1 (1-1.3)  Normal Weight 18.5-24.9 25-35 1 (0.8-1)  Overweight 25-29.9 15-25 0.6 (0.5-0.7)  Obese (includes all classes) 30 and greater 11-20 0.5 (0.4-0.6)  *Body mass index is calculated as weight in kilograms divided by height in meters squared or as weight in pounds multiplied by 703 divided by height in inches.  ?Calculations assume a 1.1-4.4 lb weight gain in the first trimester. Modified from Institute of Medicine ([place]). Weight gain during pregnancy: reexamining the guidelines. [place], [place]. [employer]; 2009. 2009 National Academy of Sciences.      Pregnancy - Second Trimester The second trimester of pregnancy (3 to 6 months) is a period of rapid growth for you and your baby. At the end of the sixth month, your baby is about 9 inches long and weighs 1 1/2 pounds. You will begin to feel the baby move between 18 and 20 weeks of the pregnancy. This is called quickening. Weight gain is faster. A clear fluid (colostrum) may leak out of your breasts. You may feel small contractions of the womb (uterus). This is known as false labor or Braxton-Hicks contractions. This is like a practice for labor when the baby is ready to be born. Usually, the problems with morning sickness have usually passed by the end of your first trimester. Some women develop small dark blotches (called cholasma, mask of pregnancy) on their face that  usually goes away after the baby is born. Exposure to the sun makes the blotches worse. Acne may also develop in some pregnant women and pregnant women who have acne, may find that it goes away. PRENATAL EXAMS  Blood work may continue to be done during prenatal exams. These tests are done to check on your health and the probable health of your baby. Blood work is used to follow your blood levels (hemoglobin). Anemia (low hemoglobin) is common during pregnancy. Iron and vitamins are given to help prevent this. You will also be checked for diabetes between 24 and 28 weeks of the pregnancy. Some of the previous blood tests may be repeated.  The size of the uterus is measured during each visit. This is to make sure that the baby is continuing to grow properly according to the dates of the pregnancy.  Your blood pressure is checked every prenatal visit. This is to make sure you are not getting toxemia.  Your urine is checked to make sure you do not have an infection, diabetes or protein in the urine.  Your weight is checked often to make sure gains are happening at the suggested rate. This is to ensure that both you and your baby are growing normally.  Sometimes, an ultrasound is performed to confirm the proper growth and development of the baby. This is a test which bounces harmless sound waves off the baby so your caregiver can more accurately determine due dates. Sometimes, a test is done on the amniotic fluid surrounding the baby. This test is called an amniocentesis. The amniotic  fluid is obtained by sticking a needle into the belly (abdomen). This is done to check the chromosomes in instances where there is a concern about possible genetic problems with the baby. It is also sometimes done near the end of pregnancy if an early delivery is required. In this case, it is done to help make sure the baby's lungs are mature enough for the baby to live outside of the womb. CHANGES OCCURING IN THE SECOND  TRIMESTER OF PREGNANCY Your body goes through many changes during pregnancy. They vary from person to person. Talk to your caregiver about changes you notice that you are concerned about.  During the second trimester, you will likely have an increase in your appetite. It is normal to have cravings for certain foods. This varies from person to person and pregnancy to pregnancy.  Your lower abdomen will begin to bulge.  You may have to urinate more often because the uterus and baby are pressing on your bladder. It is also common to get more bladder infections during pregnancy. You can help this by drinking lots of fluids and emptying your bladder before and after intercourse.  You may begin to get stretch marks on your hips, abdomen, and breasts. These are normal changes in the body during pregnancy. There are no exercises or medicines to take that prevent this change.  You may begin to develop swollen and bulging veins (varicose veins) in your legs. Wearing support hose, elevating your feet for 15 minutes, 3 to 4 times a day and limiting salt in your diet helps lessen the problem.  Heartburn may develop as the uterus grows and pushes up against the stomach. Antacids recommended by your caregiver helps with this problem. Also, eating smaller meals 4 to 5 times a day helps.  Constipation can be treated with a stool softener or adding bulk to your diet. Drinking lots of fluids, and eating vegetables, fruits, and whole grains are helpful.  Exercising is also helpful. If you have been very active up until your pregnancy, most of these activities can be continued during your pregnancy. If you have been less active, it is helpful to start an exercise program such as walking.  Hemorrhoids may develop at the end of the second trimester. Warm sitz baths and hemorrhoid cream recommended by your caregiver helps hemorrhoid problems.  Backaches may develop during this time of your pregnancy. Avoid heavy  lifting, wear low heal shoes, and practice good posture to help with backache problems.  Some pregnant women develop tingling and numbness of their hand and fingers because of swelling and tightening of ligaments in the wrist (carpel tunnel syndrome). This goes away after the baby is born.  As your breasts enlarge, you may have to get a bigger bra. Get a comfortable, cotton, support bra. Do not get a nursing bra until the last month of the pregnancy if you will be nursing the baby.  You may get a dark line from your belly button to the pubic area called the linea nigra.  You may develop rosy cheeks because of increase blood flow to the face.  You may develop spider looking lines of the face, neck, arms, and chest. These go away after the baby is born. HOME CARE INSTRUCTIONS   It is extremely important to avoid all smoking, herbs, alcohol, and unprescribed drugs during your pregnancy. These chemicals affect the formation and growth of the baby. Avoid these chemicals throughout the pregnancy to ensure the delivery of a healthy infant.  Most of your home care instructions are the same as suggested for the first trimester of your pregnancy. Keep your caregiver's appointments. Follow your caregiver's instructions regarding medicine use, exercise, and diet.  During pregnancy, you are providing food for you and your baby. Continue to eat regular, well-balanced meals. Choose foods such as meat, fish, milk and other low fat dairy products, vegetables, fruits, and whole-grain breads and cereals. Your caregiver will tell you of the ideal weight gain.  A physical sexual relationship may be continued up until near the end of pregnancy if there are no other problems. Problems could include early (premature) leaking of amniotic fluid from the membranes, vaginal bleeding, abdominal pain, or other medical or pregnancy problems.  Exercise regularly if there are no restrictions. Check with your caregiver if you  are unsure of the safety of some of your exercises. The greatest weight gain will occur in the last 2 trimesters of pregnancy. Exercise will help you:  Control your weight.  Get you in shape for labor and delivery.  Lose weight after you have the baby.  Wear a good support or jogging bra for breast tenderness during pregnancy. This may help if worn during sleep. Pads or tissues may be used in the bra if you are leaking colostrum.  Do not use hot tubs, steam rooms or saunas throughout the pregnancy.  Wear your seat belt at all times when driving. This protects you and your baby if you are in an accident.  Avoid raw meat, uncooked cheese, cat litter boxes, and soil used by cats. These carry germs that can cause birth defects in the baby.  The second trimester is also a good time to visit your dentist for your dental health if this has not been done yet. Getting your teeth cleaned is okay. Use a soft toothbrush. Brush gently during pregnancy.  It is easier to leak urine during pregnancy. Tightening up and strengthening the pelvic muscles will help with this problem. Practice stopping your urination while you are going to the bathroom. These are the same muscles you need to strengthen. It is also the muscles you would use as if you were trying to stop from passing gas. You can practice tightening these muscles up 10 times a set and repeating this about 3 times per day. Once you know what muscles to tighten up, do not perform these exercises during urination. It is more likely to contribute to an infection by backing up the urine.  Ask for help if you have financial, counseling, or nutritional needs during pregnancy. Your caregiver will be able to offer counseling for these needs as well as refer you for other special needs.  Your skin may become oily. If so, wash your face with mild soap, use non-greasy moisturizer and oil or cream based makeup. MEDICINES AND DRUG USE IN PREGNANCY  Take prenatal  vitamins as directed. The vitamin should contain 1 milligram of folic acid. Keep all vitamins out of reach of children. Only a couple vitamins or tablets containing iron may be fatal to a baby or young child when ingested.  Avoid use of all medicines, including herbs, over-the-counter medicines, not prescribed or suggested by your caregiver. Only take over-the-counter or prescription medicines for pain, discomfort, or fever as directed by your caregiver. Do not use aspirin.  Let your caregiver also know about herbs you may be using.  Alcohol is related to a number of birth defects. This includes fetal alcohol syndrome. All alcohol, in any form,  should be avoided completely. Smoking will cause low birth rate and premature babies.  Street or illegal drugs are very harmful to the baby. They are absolutely forbidden. A baby born to an addicted mother will be addicted at birth. The baby will go through the same withdrawal an adult does. SEEK MEDICAL CARE IF:  You have any concerns or worries during your pregnancy. It is better to call with your questions if you feel they cannot wait, rather than worry about them. SEEK IMMEDIATE MEDICAL CARE IF:   An unexplained oral temperature above 102 F (38.9 C) develops, or as your caregiver suggests.  You have leaking of fluid from the vagina (birth canal). If leaking membranes are suspected, take your temperature and tell your caregiver of this when you call.  There is vaginal spotting, bleeding, or passing clots. Tell your caregiver of the amount and how many pads are used. Light spotting in pregnancy is common, especially following intercourse.  You develop a bad smelling vaginal discharge with a change in the color from clear to white.  You continue to feel sick to your stomach (nauseated) and have no relief from remedies suggested. You vomit blood or coffee ground-like materials.  You lose more than 2 pounds of weight or gain more than 2 pounds of  weight over 1 week, or as suggested by your caregiver.  You notice swelling of your face, hands, feet, or legs.  You get exposed to [place] measles and have never had them.  You are exposed to fifth disease or chickenpox.  You develop belly (abdominal) pain. Round ligament discomfort is a common non-cancerous (benign) cause of abdominal pain in pregnancy. Your caregiver still must evaluate you.  You develop a bad headache that does not go away.  You develop fever, diarrhea, pain with urination, or shortness of breath.  You develop visual problems, blurry, or double vision.  You fall or are in a car accident or any kind of trauma.  There is mental or physical violence at home. Document Released: 10/29/2001 Document Revised: 07/29/2012 Document Reviewed: 05/03/2009 [facility] Patient Information 2014 [place], [place]. Place 10-18 weeks prenatal visit patient instructions here.

## 2013-06-28 ENCOUNTER — Inpatient Hospital Stay (HOSPITAL_COMMUNITY)
Admission: AD | Admit: 2013-06-28 | Discharge: 2013-06-28 | Disposition: A | Discharge status: Home / Self Care | Payer: Medicaid Other | Source: Ambulatory Visit | Attending: Obstetrics & Gynecology | Admitting: Obstetrics & Gynecology

## 2013-06-28 ENCOUNTER — Encounter (HOSPITAL_COMMUNITY): Payer: Self-pay

## 2013-06-28 DIAGNOSIS — Z3492 Encounter for supervision of normal pregnancy, unspecified, second trimester: Secondary | ICD-10-CM

## 2013-06-28 DIAGNOSIS — N949 Unspecified condition associated with female genital organs and menstrual cycle: Secondary | ICD-10-CM

## 2013-06-28 DIAGNOSIS — N390 Urinary tract infection, site not specified: Secondary | ICD-10-CM | POA: Insufficient documentation

## 2013-06-28 DIAGNOSIS — R109 Unspecified abdominal pain: Secondary | ICD-10-CM | POA: Insufficient documentation

## 2013-06-28 DIAGNOSIS — O2390 Unspecified genitourinary tract infection in pregnancy, unspecified trimester: Secondary | ICD-10-CM | POA: Insufficient documentation

## 2013-06-28 LAB — URINE MICROSCOPIC-ADD ON

## 2013-06-28 LAB — URINALYSIS, ROUTINE W REFLEX MICROSCOPIC
Bilirubin Urine: NEGATIVE
Nitrite: NEGATIVE
Specific Gravity, Urine: 1.02 (ref 1.005–1.030)
Urobilinogen, UA: 0.2 mg/dL (ref 0.0–1.0)
pH: 7 (ref 5.0–8.0)

## 2013-06-28 MED ORDER — ABDOMINAL BINDER/ELASTIC LARGE MISC: 1.0000 [IU] | Freq: Every day | Status: DC | PRN

## 2013-06-28 MED ORDER — ACETAMINOPHEN 500 MG PO TABS
1000.0000 mg | ORAL_TABLET | Freq: Once | ORAL | Status: AC
  Administered 2013-06-28: 1000 mg via ORAL
  Filled 2013-06-28: qty 2

## 2013-06-28 MED ORDER — CEPHALEXIN 500 MG PO CAPS: 500.0000 mg | ORAL_CAPSULE | Freq: Four times a day (QID) | ORAL | Status: DC

## 2013-06-28 NOTE — MAU Provider Note (Signed)
Attestation of Attending Supervision of Advanced Practitioner (PA/CNM/NP): Evaluation and management procedures were performed by the Advanced Practitioner under my supervision and collaboration.  I have reviewed the Advanced Practitioner's note and chart, and I agree with the management and plan.  Shahidah Nesbitt, MD, FACOG Attending Obstetrician & Gynecologist Faculty Practice, [facility]   

## 2013-06-28 NOTE — MAU Note (Signed)
Pt reports having abd cramping in her left side that gets worse when she walks.

## 2013-06-28 NOTE — MAU Provider Note (Signed)
History     CSN: [id number]  Arrival date and time: 06/28/13 1153   First Provider Initiated Contact with Patient 06/28/13 1222      Chief Complaint  Patient presents with  . Abdominal Cramping   HPI [name] is a 22 y.o. G1P0 at [redacted]w[redacted]d who presents to MAU today with complaint of left sided mid-abdomen pain since about 11 am today. The patient states that she was walking when the pain started. She rates that pain at 7/10. She describes the pain as throbbing and tightening. She denies vaginal bleeding, discharge, N/V/D, fever or UTI symptoms. The patient denies pain medication. She states that she is afraid to take any medications because of the possible effects of pregnancy.   OB History   Grav Para Term Preterm Abortions TAB SAB Ect Mult Living   1               Past Medical History  Diagnosis Date  . Asthma   . Trichomonas   . Arthritis     Past Surgical History  Procedure Laterality Date  . No past surgeries      Family History  Problem Relation Age of Onset  . Arthritis Mother   . Diabetes Maternal Grandmother   . Arthritis Maternal Grandmother     History  Substance Use Topics  . Smoking status: Never Smoker   . Smokeless tobacco: Never Used  . Alcohol Use: No    Allergies: No Known Allergies  No prescriptions prior to admission    Review of Systems  Constitutional: Negative for fever and malaise/fatigue.  Gastrointestinal: Positive for abdominal pain. Negative for nausea, vomiting, diarrhea and constipation.  Genitourinary: Negative for dysuria, urgency and frequency.       Neg - vaginal bleeding, discharge   Physical Exam   Blood pressure 118/75, pulse 83, temperature 97.9 F (36.6 C), temperature source Oral, resp. rate 18, height 5' 5.5" (1.664 m), weight 218 lb 3.2 oz (98.975 kg), last menstrual period 02/09/2013.  Physical Exam  Constitutional: She is oriented to person, place, and time. She appears well-developed and  well-nourished. No distress.  HENT:  Head: Normocephalic and atraumatic.  Cardiovascular: Normal rate, regular rhythm and normal heart sounds.   Respiratory: Effort normal and breath sounds normal. No respiratory distress.  GI: Soft. Bowel sounds are normal. She exhibits no distension and no mass. There is no tenderness. There is no rebound and no guarding.  Neurological: She is alert and oriented to person, place, and time.  Skin: Skin is warm and dry. No erythema.  Psychiatric: She has a normal mood and affect.   Results for orders placed during the hospital encounter of 06/28/13 (from the past 24 hour(s))  URINALYSIS, ROUTINE W REFLEX MICROSCOPIC     Status: Abnormal   Collection Time    06/28/13 12:00 PM      Result Value Range   Color, Urine YELLOW  YELLOW   APPearance CLOUDY (*) CLEAR   Specific Gravity, Urine 1.020  1.005 - 1.030   pH 7.0  5.0 - 8.0   Glucose, UA NEGATIVE  NEGATIVE mg/dL   Hgb urine dipstick NEGATIVE  NEGATIVE   Bilirubin Urine NEGATIVE  NEGATIVE   Ketones, ur 15 (*) NEGATIVE mg/dL   Protein, ur 30 (*) NEGATIVE mg/dL   Urobilinogen, UA 0.2  0.0 - 1.0 mg/dL   Nitrite NEGATIVE  NEGATIVE   Leukocytes, UA LARGE (*) NEGATIVE  URINE MICROSCOPIC-ADD ON     Status: Abnormal  Collection Time    06/28/13 12:00 PM      Result Value Range   Squamous Epithelial / LPF MANY (*) RARE   WBC, UA 21-50  <3 WBC/hpf   RBC / HPF 0-2  <3 RBC/hpf   Bacteria, UA MANY (*) RARE    MAU Course  Procedures None  MDM 1000 mg Tylenol given in MAU  Assessment and Plan  A: Round ligament pain UTI  P: Discharge home Rx for Keflex and Abdominal binder sent to patient's pharmacy Patient advised to increase PO hydration as tolerated Patient advised to keep appointment in clinic as scheduled or call for earlier appointment if needed Patient may return to MAU as needed or if her condition were to change or worsen  Freddi Starr, PA-C  06/28/2013, 1:59 PM

## 2013-07-06 ENCOUNTER — Telehealth (HOSPITAL_COMMUNITY): Payer: Self-pay | Admitting: *Deleted

## 2013-07-13 ENCOUNTER — Other Ambulatory Visit: Payer: Self-pay | Admitting: *Deleted

## 2013-07-13 DIAGNOSIS — J4520 Mild intermittent asthma, uncomplicated: Secondary | ICD-10-CM

## 2013-07-13 DIAGNOSIS — Z3492 Encounter for supervision of normal pregnancy, unspecified, second trimester: Secondary | ICD-10-CM

## 2013-07-14 ENCOUNTER — Encounter (HOSPITAL_COMMUNITY): Payer: Self-pay | Admitting: *Deleted

## 2013-07-14 ENCOUNTER — Inpatient Hospital Stay (HOSPITAL_COMMUNITY)
Admission: AD | Admit: 2013-07-14 | Discharge: 2013-07-14 | Disposition: A | Discharge status: Home / Self Care | Payer: Medicaid Other | Source: Ambulatory Visit | Attending: Family Medicine | Admitting: Family Medicine

## 2013-07-14 ENCOUNTER — Ambulatory Visit (HOSPITAL_COMMUNITY)
Admission: RE | Admit: 2013-07-14 | Discharge: 2013-07-14 | Disposition: A | Discharge status: Home / Self Care | Payer: Medicaid Other | Source: Ambulatory Visit | Attending: Family Medicine | Admitting: Family Medicine

## 2013-07-14 ENCOUNTER — Ambulatory Visit (INDEPENDENT_AMBULATORY_CARE_PROVIDER_SITE_OTHER): Payer: Medicaid Other | Admitting: Advanced Practice Midwife

## 2013-07-14 ENCOUNTER — Other Ambulatory Visit: Payer: Self-pay | Admitting: Family Medicine

## 2013-07-14 VITALS — BP 119/78 | Temp 97.3°F | Wt 221.7 lb

## 2013-07-14 DIAGNOSIS — O3430 Maternal care for cervical incompetence, unspecified trimester: Secondary | ICD-10-CM

## 2013-07-14 DIAGNOSIS — J4520 Mild intermittent asthma, uncomplicated: Secondary | ICD-10-CM

## 2013-07-14 DIAGNOSIS — O26879 Cervical shortening, unspecified trimester: Secondary | ICD-10-CM | POA: Insufficient documentation

## 2013-07-14 DIAGNOSIS — Z3689 Encounter for other specified antenatal screening: Secondary | ICD-10-CM | POA: Insufficient documentation

## 2013-07-14 DIAGNOSIS — Z3492 Encounter for supervision of normal pregnancy, unspecified, second trimester: Secondary | ICD-10-CM

## 2013-07-14 DIAGNOSIS — O3432 Maternal care for cervical incompetence, second trimester: Secondary | ICD-10-CM

## 2013-07-14 DIAGNOSIS — O99891 Other specified diseases and conditions complicating pregnancy: Secondary | ICD-10-CM

## 2013-07-14 LAB — POCT URINALYSIS DIP (DEVICE)
Bilirubin Urine: NEGATIVE
Hgb urine dipstick: NEGATIVE
Leukocytes, UA: NEGATIVE
Nitrite: NEGATIVE
Protein, ur: 30 mg/dL — AB
pH: 7 (ref 5.0–8.0)

## 2013-07-14 MED ORDER — PROGESTERONE MICRONIZED 200 MG PO CAPS: 200.0000 mg | ORAL_CAPSULE | Freq: Every day | ORAL | Status: DC

## 2013-07-14 NOTE — Progress Notes (Signed)
Doing well.  Good fetal movement, denies vaginal bleeding, LOF, cramping/contractions. Repeat anatomy scan today for improved cardiac views.

## 2013-07-14 NOTE — MAU Provider Note (Signed)
  History     CSN: [id number]  Arrival date and time: 07/14/13 4098   First Provider Initiated Contact with Patient 07/14/13 1017      No chief complaint on file.  HPI Denise [name] is a 22 y.o. G1P0 at [redacted]w[redacted]d sent from [place] to MAU for shortened cervix. Pt without complaints at this time. No ctx, no lof, no vb, no change in FM.  OB History   Grav Para Term Preterm Abortions TAB SAB Ect Mult Living   1               Past Medical History  Diagnosis Date  . Asthma   . Trichomonas   . Arthritis     Past Surgical History  Procedure Laterality Date  . No past surgeries      Family History  Problem Relation Age of Onset  . Arthritis Mother   . Diabetes Maternal Grandmother   . Arthritis Maternal Grandmother     History  Substance Use Topics  . Smoking status: Never Smoker   . Smokeless tobacco: Never Used  . Alcohol Use: No    Allergies: No Known Allergies  Prescriptions prior to admission  Medication Sig Dispense Refill  . albuterol (PROVENTIL HFA;VENTOLIN HFA) 108 (90 BASE) MCG/ACT inhaler Inhale 2 puffs into the lungs every 6 (six) hours as needed for wheezing.  1 Inhaler  1  . cephALEXin (KEFLEX) 500 MG capsule Take 1 capsule (500 mg total) by mouth 4 (four) times daily.  20 capsule  0  . Elastic Bandages & Supports (ABDOMINAL BINDER/ELASTIC LARGE) MISC 1 Units by Does not apply route daily as needed.  1 each  0  . Prenatal Vit-Fe Fumarate-FA (PRENATAL MULTIVITAMIN) TABS Take 1 tablet by mouth daily at 12 noon.        ROS Physical Exam   Blood pressure 134/80, pulse 96, resp. rate 18, height 5\' 5"  (1.651 m), weight 100.517 kg (221 lb 9.6 oz), last menstrual period 02/09/2013.  Physical Exam  Dilation: Closed Effacement (%): 20 Station: -3 Exam by:: Dr. Ike Bene  8/27 [place] nml anatomy scan but cervical funnely and length of 1cm  MAU Course  Procedures  MDM Pt s/p cervical check closed.  Assessment and Plan  Denise [name] is a 22 y.o. G1P0 at [redacted]w[redacted]d  w/ shortened cervix. Starting prometrium 200mg  PV qhs, and will repeat US in 2wks and f/u with [facility]. Pt given PTL precautions. Discharged home.  Discussed with Dr. Marcell Barlow, Audie Clear 07/14/2013, 10:49 AM

## 2013-07-14 NOTE — MAU Note (Signed)
Patient sent to MAU from ultrasound for consult with Dr. Shawnie Pons. Patient denies pain or bleeding.

## 2013-07-14 NOTE — Progress Notes (Signed)
P = 86  Pt reports having "round ligament pain".

## 2013-07-15 ENCOUNTER — Encounter: Payer: Self-pay | Admitting: *Deleted

## 2013-07-15 DIAGNOSIS — O9989 Other specified diseases and conditions complicating pregnancy, childbirth and the puerperium: Secondary | ICD-10-CM

## 2013-07-22 ENCOUNTER — Telehealth: Payer: Self-pay | Admitting: Family Medicine

## 2013-07-22 NOTE — Telephone Encounter (Signed)
Please schedule pt for 2-3week follow up in [facility] after she has repeat US for Shortened cervix./ When I called patient to move her about her appointment. She refuse to come to that appointment because she was told to come on 09/10 @ 8:15. I explained to her that was for her U/S appointment. She stated we need to get on one accord, and she was coming on the 10th because she took off work and wasn't going to keep changing cause Y'all don't know what Jerrye Noble is doing.

## 2013-07-28 ENCOUNTER — Ambulatory Visit (HOSPITAL_COMMUNITY)
Admission: RE | Admit: 2013-07-28 | Discharge: 2013-07-28 | Disposition: A | Discharge status: Home / Self Care | Payer: Medicaid Other | Source: Ambulatory Visit | Attending: Family Medicine | Admitting: Family Medicine

## 2013-07-28 ENCOUNTER — Ambulatory Visit (INDEPENDENT_AMBULATORY_CARE_PROVIDER_SITE_OTHER): Payer: Medicaid Other | Admitting: Family

## 2013-07-28 ENCOUNTER — Other Ambulatory Visit (HOSPITAL_COMMUNITY): Payer: Self-pay | Admitting: Family Medicine

## 2013-07-28 ENCOUNTER — Ambulatory Visit (HOSPITAL_COMMUNITY): Payer: Medicaid Other

## 2013-07-28 VITALS — BP 126/85 | Temp 98.0°F | Wt 221.1 lb

## 2013-07-28 DIAGNOSIS — O99891 Other specified diseases and conditions complicating pregnancy: Secondary | ICD-10-CM

## 2013-07-28 DIAGNOSIS — O26879 Cervical shortening, unspecified trimester: Secondary | ICD-10-CM

## 2013-07-28 DIAGNOSIS — N883 Incompetence of cervix uteri: Secondary | ICD-10-CM | POA: Insufficient documentation

## 2013-07-28 DIAGNOSIS — O26872 Cervical shortening, second trimester: Secondary | ICD-10-CM | POA: Insufficient documentation

## 2013-07-28 DIAGNOSIS — O3432 Maternal care for cervical incompetence, second trimester: Secondary | ICD-10-CM

## 2013-07-28 DIAGNOSIS — Z3492 Encounter for supervision of normal pregnancy, unspecified, second trimester: Secondary | ICD-10-CM

## 2013-07-28 DIAGNOSIS — Z3689 Encounter for other specified antenatal screening: Secondary | ICD-10-CM | POA: Insufficient documentation

## 2013-07-28 DIAGNOSIS — Z9889 Other specified postprocedural states: Secondary | ICD-10-CM

## 2013-07-28 DIAGNOSIS — O26873 Cervical shortening, third trimester: Secondary | ICD-10-CM

## 2013-07-28 HISTORY — DX: Other specified postprocedural states: Z98.890

## 2013-07-28 LAB — POCT URINALYSIS DIP (DEVICE)
Ketones, ur: NEGATIVE mg/dL
Nitrite: NEGATIVE
Protein, ur: 30 mg/dL — AB
Urobilinogen, UA: 0.2 mg/dL (ref 0.0–1.0)

## 2013-07-28 MED ORDER — BETAMETHASONE SOD PHOS & ACET 6 (3-3) MG/ML IJ SUSP
12.0000 mg | Freq: Once | INTRAMUSCULAR | Status: AC
  Administered 2013-07-28: 12 mg via INTRAMUSCULAR

## 2013-07-28 NOTE — Progress Notes (Signed)
Pulse: 86

## 2013-07-28 NOTE — Progress Notes (Signed)
No report of vaginal bleeding or leaking; [place] for cervical length today, remains short > BMZ today.  Blood pressure is mildly elevated, 1+ protein, obtain baseline labs.

## 2013-07-29 ENCOUNTER — Encounter: Payer: Medicaid Other | Admitting: Obstetrics and Gynecology

## 2013-07-29 ENCOUNTER — Ambulatory Visit (INDEPENDENT_AMBULATORY_CARE_PROVIDER_SITE_OTHER): Payer: Medicaid Other | Admitting: *Deleted

## 2013-07-29 DIAGNOSIS — O26872 Cervical shortening, second trimester: Secondary | ICD-10-CM

## 2013-07-29 DIAGNOSIS — O26879 Cervical shortening, unspecified trimester: Secondary | ICD-10-CM

## 2013-07-29 MED ORDER — BETAMETHASONE SOD PHOS & ACET 6 (3-3) MG/ML IJ SUSP: 12.5000 mg | Freq: Once | INTRAMUSCULAR | Status: DC

## 2013-07-29 MED ORDER — BETAMETHASONE SOD PHOS & ACET 6 (3-3) MG/ML IJ SUSP
12.0000 mg | Freq: Once | INTRAMUSCULAR | Status: AC
  Administered 2013-07-29: 12 mg via INTRAMUSCULAR

## 2013-07-29 NOTE — Progress Notes (Signed)
Pt here for scheduled betamethasone injection. Medication given and pt tolerated well- see MAR for further documentation.  Pt asked if she would be having another [place] next week. Per [place] report from yesterday, pt needs cervical length measurement in 1 week. [place] ordered and scheduled for 9/18 @ 1015.  Pt voiced understanding.

## 2013-07-30 ENCOUNTER — Ambulatory Visit (INDEPENDENT_AMBULATORY_CARE_PROVIDER_SITE_OTHER): Payer: Medicaid Other | Admitting: *Deleted

## 2013-07-30 VITALS — BP 121/72

## 2013-07-30 DIAGNOSIS — O368190 Decreased fetal movements, unspecified trimester, not applicable or unspecified: Secondary | ICD-10-CM

## 2013-07-30 LAB — US OB LIMITED

## 2013-07-30 NOTE — Progress Notes (Signed)
P = 74    Pt presented to clinic @ 1000 with c/o no fetal movement since yesterday @ 0815. She was very anxious and stated that she did not have time to go to MAU for evaluation because she needs to be at work today @ 1100.  I brought pt in to clinic and performed US per PW doppler- FHR= 158, per m-mode = 146. FM was present and pt observed this on [place] monitor screen.  EFM applied for pt reassurance, during which time FM was audible and she felt good amount of FM. Pt left clinic feeling reassured about fetal well-being.  Results reported to [facility] CNM.

## 2013-08-03 ENCOUNTER — Encounter (HOSPITAL_COMMUNITY): Payer: Self-pay | Admitting: Emergency Medicine

## 2013-08-03 ENCOUNTER — Emergency Department (HOSPITAL_COMMUNITY)
Admission: EM | Admit: 2013-08-03 | Discharge: 2013-08-03 | Disposition: A | Discharge status: Home / Self Care | Payer: Medicaid Other | Source: Home / Self Care | Attending: Emergency Medicine | Admitting: Emergency Medicine

## 2013-08-03 DIAGNOSIS — M779 Enthesopathy, unspecified: Secondary | ICD-10-CM

## 2013-08-03 NOTE — ED Notes (Signed)
Pt  Is  Pregnant  She  Reports  Pain in the  l  Wrist      X  4   Weeks      She  denys  Any  specefic  Injury         Pt  Reports  The  Symptoms  Not  releived  By  Wearing   A  Brace

## 2013-08-03 NOTE — ED Provider Notes (Signed)
Chief Complaint:   Chief Complaint  Patient presents with  . Wrist Pain    History of Present Illness:   Denise [name] is 22 year old female who is [redacted] weeks pregnant. She describes a four-week history of a radial left wrist pain. This occurred after she fell asleep leaning on her wrist. She notes slight swelling in the area. It hurts somewhat to flex and extend the wrist but more so to bend laterally to the ulnar side. She denies any numbness or tingling. There's no history of trauma.  Review of Systems:  Other than noted above, the patient denies any of the following symptoms: Systemic:  No fevers, chills, or sweats.  No fatigue or tiredness. Musculoskeletal:  No joint pain, arthritis, bursitis, swelling, back pain, or neck pain.  Neurological:  No muscular weakness, paresthesias.  PMFSH:  Past medical history, family history, social history, meds, and allergies were reviewed.  No history of gout.   She's [redacted] weeks pregnant and is on progesterone due to threatened preterm labor. She denies any bleeding. She has asthma but this is not a problem for her.  Physical Exam:   Vital signs:  BP 120/71  Pulse 81  Temp(Src) 98.6 F (37 C) (Oral)  Resp 18  SpO2 100%  LMP 02/09/2013 Gen:  Alert and oriented times 3.  In no distress. Musculoskeletal:  Exam of the hand reveals pain to palpation over the radial styloid, over the course of the extensor tendon of the thumb. There is no visible swelling. She has a positive Finkelstein test. The wrist itself has a full range of motion with slight pain at the endpoints of movement.  Otherwise, all joints had a full a ROM with no swelling, bruising or deformity.  No edema, pulses full. Extremities were warm and pink.  Capillary refill was brisk.  Skin:  Clear, warm and dry.  No rash. Neuro:  Alert and oriented times 3.  Muscle strength was normal.  Sensation was intact to light touch.   Course in Urgent Care Center:   She was placed in a thumb spica and  should wear this continuously for the next 2 weeks.  Assessment:  The encounter diagnosis was Tendonitis.  Discuss treatment options with her. Since she is only [redacted] weeks pregnant, we're somewhat limited and I like to avoid x-rays and nonsteroidal anti-inflammatories would be contraindicated. A corticosteroid injection might be helpful, but she is reluctant to have this done right now. She cannot come back in a few weeks if no improvement.  Plan:   1.  Meds:  The following meds were prescribed:   New Prescriptions   No medications on file    2.  Patient Education/Counseling:  The patient was given appropriate handouts, self care instructions, and instructed in symptomatic relief, including rest and activity, elevation, application of ice and compression. She'll wear the thumb spica continuously for the next 2 weeks and apply ice.  3.  Follow up:  The patient was told to follow up if no better in 3 to 4 days, if becoming worse in any way, and given some red flag symptoms such as worsening pain which would prompt immediate return.  Follow up here if necessary.      Reuben Likes, MD 08/03/13 [id number]

## 2013-08-05 ENCOUNTER — Ambulatory Visit (HOSPITAL_COMMUNITY)
Admission: RE | Admit: 2013-08-05 | Discharge: 2013-08-05 | Disposition: A | Discharge status: Home / Self Care | Payer: Medicaid Other | Source: Ambulatory Visit | Attending: Obstetrics & Gynecology | Admitting: Obstetrics & Gynecology

## 2013-08-05 ENCOUNTER — Encounter: Payer: Self-pay | Admitting: Obstetrics & Gynecology

## 2013-08-05 ENCOUNTER — Other Ambulatory Visit: Payer: Self-pay | Admitting: Obstetrics & Gynecology

## 2013-08-05 ENCOUNTER — Encounter: Payer: Self-pay | Admitting: Family Medicine

## 2013-08-05 ENCOUNTER — Ambulatory Visit (INDEPENDENT_AMBULATORY_CARE_PROVIDER_SITE_OTHER): Payer: Medicaid Other | Admitting: Family Medicine

## 2013-08-05 ENCOUNTER — Other Ambulatory Visit: Payer: Self-pay | Admitting: Family Medicine

## 2013-08-05 DIAGNOSIS — O26879 Cervical shortening, unspecified trimester: Secondary | ICD-10-CM

## 2013-08-05 LAB — POCT URINALYSIS DIP (DEVICE)
Glucose, UA: NEGATIVE mg/dL
Leukocytes, UA: NEGATIVE
Nitrite: NEGATIVE
Urobilinogen, UA: 0.2 mg/dL (ref 0.0–1.0)

## 2013-08-05 NOTE — Progress Notes (Signed)
  Subjective:    Denise [name] is a 22 y.o. female being seen today for her obstetrical visit. She is at [redacted]w[redacted]d gestation. Patient reports no bleeding, no contractions, no cramping and no leaking. Fetal movement: normal.  Pt with cervical shortening and on progesterone. Pt with [place] today with stable cervix.   Menstrual History: OB History   Grav Para Term Preterm Abortions TAB SAB Ect Mult Living   1                Patient's last menstrual period was 02/09/2013.    The following portions of the patient's history were reviewed and updated as appropriate: allergies, current medications, past family history, past medical history, past social history, past surgical history and problem list.  Review of Systems Pertinent items are noted in HPI.   Objective:    BP 118/78  Temp(Src) 98 F (36.7 C)  Wt 99.338 kg (219 lb)  BMI 36.44 kg/m2  LMP 02/09/2013 FHT: 157 BPM  Uterine Size: 26 cm     Assessment:   Denise [name] is a 22 y.o. G1P0 at [redacted]w[redacted]d  here for ROB visit.  Shortened cervix - currently stable. Continue with progesterone PV, repeat US next week.   Discussed with Patient:  -Plans to breast  feed.  All questions answered. -Continue prenatal vitamins. -Reviewed fetal kick counts (Pt to perform daily at a time when the baby is active, lie laterally with both hands on belly in quiet room and count all movements (hiccups, shoulder rolls, obvious kicks, etc); pt is to report to clinic or MAU for less than 10 movements felt in a one hour time period-pt told as soon as she counts 10 movements the count is complete.)  - Routine precautions discussed (depression, infection s/s).   Patient provided with all pertinent phone numbers for emergencies. - RTC for any VB, regular, painful cramps/ctxs occurring at a rate of >2/10 min, fever (100.5 or higher), n/v/d, any pain that is unresolving or worsening, LOF, decreased fetal movement, CP, SOB, edema  Problems: Patient Active Problem  List   Diagnosis Date Noted  . Short cervix affecting pregnancy 07/28/2013  . Other current maternal conditions classifiable elsewhere, antepartum 07/15/2013  . Supervision of normal pregnancy 05/19/2013  . 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 Trichomonas vaginitis 04/15/2013  . Asthma 04/29/2012    To Do:  Edu: [x]  PTL precautions; [ ]  BF class; [ ]  childbirth class; [ ]   BF counseling;

## 2013-08-05 NOTE — Patient Instructions (Addendum)
Pregnancy - Second Trimester The second trimester of pregnancy (3 to 6 months) is a period of rapid growth for you and your baby. At the end of the sixth month, your baby is about 9 inches long and weighs 1 1/2 pounds. You will begin to feel the baby move between 18 and 20 weeks of the pregnancy. This is called quickening. Weight gain is faster. A clear fluid (colostrum) may leak out of your breasts. You may feel small contractions of the womb (uterus). This is known as false labor or Braxton-Hicks contractions. This is like a practice for labor when the baby is ready to be born. Usually, the problems with morning sickness have usually passed by the end of your first trimester. Some women develop small dark blotches (called cholasma, mask of pregnancy) on their face that usually goes away after the baby is born. Exposure to the sun makes the blotches worse. Acne may also develop in some pregnant women and pregnant women who have acne, may find that it goes away. PRENATAL EXAMS  Blood work may continue to be done during prenatal exams. These tests are done to check on your health and the probable health of your baby. Blood work is used to follow your blood levels (hemoglobin). Anemia (low hemoglobin) is common during pregnancy. Iron and vitamins are given to help prevent this. You will also be checked for diabetes between 24 and 28 weeks of the pregnancy. Some of the previous blood tests may be repeated.  The size of the uterus is measured during each visit. This is to make sure that the baby is continuing to grow properly according to the dates of the pregnancy.  Your blood pressure is checked every prenatal visit. This is to make sure you are not getting toxemia.  Your urine is checked to make sure you do not have an infection, diabetes or protein in the urine.  Your weight is checked often to make sure gains are happening at the suggested rate. This is to ensure that both you and your baby are  growing normally.  Sometimes, an ultrasound is performed to confirm the proper growth and development of the baby. This is a test which bounces harmless sound waves off the baby so your caregiver can more accurately determine due dates. Sometimes, a test is done on the amniotic fluid surrounding the baby. This test is called an amniocentesis. The amniotic fluid is obtained by sticking a needle into the belly (abdomen). This is done to check the chromosomes in instances where there is a concern about possible genetic problems with the baby. It is also sometimes done near the end of pregnancy if an early delivery is required. In this case, it is done to help make sure the baby's lungs are mature enough for the baby to live outside of the womb. CHANGES OCCURING IN THE SECOND TRIMESTER OF PREGNANCY Your body goes through many changes during pregnancy. They vary from person to person. Talk to your caregiver about changes you notice that you are concerned about.  During the second trimester, you will likely have an increase in your appetite. It is normal to have cravings for certain foods. This varies from person to person and pregnancy to pregnancy.  Your lower abdomen will begin to bulge.  You may have to urinate more often because the uterus and baby are pressing on your bladder. It is also common to get more bladder infections during pregnancy. You can help this by drinking lots of fluids   and emptying your bladder before and after intercourse.  You may begin to get stretch marks on your hips, abdomen, and breasts. These are normal changes in the body during pregnancy. There are no exercises or medicines to take that prevent this change.  You may begin to develop swollen and bulging veins (varicose veins) in your legs. Wearing support hose, elevating your feet for 15 minutes, 3 to 4 times a day and limiting salt in your diet helps lessen the problem.  Heartburn may develop as the uterus grows and  pushes up against the stomach. Antacids recommended by your caregiver helps with this problem. Also, eating smaller meals 4 to 5 times a day helps.  Constipation can be treated with a stool softener or adding bulk to your diet. Drinking lots of fluids, and eating vegetables, fruits, and whole grains are helpful.  Exercising is also helpful. If you have been very active up until your pregnancy, most of these activities can be continued during your pregnancy. If you have been less active, it is helpful to start an exercise program such as walking.  Hemorrhoids may develop at the end of the second trimester. Warm sitz baths and hemorrhoid cream recommended by your caregiver helps hemorrhoid problems.  Backaches may develop during this time of your pregnancy. Avoid heavy lifting, wear low heal shoes, and practice good posture to help with backache problems.  Some pregnant women develop tingling and numbness of their hand and fingers because of swelling and tightening of ligaments in the wrist (carpel tunnel syndrome). This goes away after the baby is born.  As your breasts enlarge, you may have to get a bigger bra. Get a comfortable, cotton, support bra. Do not get a nursing bra until the last month of the pregnancy if you will be nursing the baby.  You may get a dark line from your belly button to the pubic area called the linea nigra.  You may develop rosy cheeks because of increase blood flow to the face.  You may develop spider looking lines of the face, neck, arms, and chest. These go away after the baby is born. HOME CARE INSTRUCTIONS   It is extremely important to avoid all smoking, herbs, alcohol, and unprescribed drugs during your pregnancy. These chemicals affect the formation and growth of the baby. Avoid these chemicals throughout the pregnancy to ensure the delivery of a healthy infant.  Most of your home care instructions are the same as suggested for the first trimester of your  pregnancy. Keep your caregiver's appointments. Follow your caregiver's instructions regarding medicine use, exercise, and diet.  During pregnancy, you are providing food for you and your baby. Continue to eat regular, well-balanced meals. Choose foods such as meat, fish, milk and other low fat dairy products, vegetables, fruits, and whole-grain breads and cereals. Your caregiver will tell you of the ideal weight gain.  A physical sexual relationship may be continued up until near the end of pregnancy if there are no other problems. Problems could include early (premature) leaking of amniotic fluid from the membranes, vaginal bleeding, abdominal pain, or other medical or pregnancy problems.  Exercise regularly if there are no restrictions. Check with your caregiver if you are unsure of the safety of some of your exercises. The greatest weight gain will occur in the last 2 trimesters of pregnancy. Exercise will help you:  Control your weight.  Get you in shape for labor and delivery.  Lose weight after you have the baby.  Wear   a good support or jogging bra for breast tenderness during pregnancy. This may help if worn during sleep. Pads or tissues may be used in the bra if you are leaking colostrum.  Do not use hot tubs, steam rooms or saunas throughout the pregnancy.  Wear your seat belt at all times when driving. This protects you and your baby if you are in an accident.  Avoid raw meat, uncooked cheese, cat litter boxes, and soil used by cats. These carry germs that can cause birth defects in the baby.  The second trimester is also a good time to visit your dentist for your dental health if this has not been done yet. Getting your teeth cleaned is okay. Use a soft toothbrush. Brush gently during pregnancy.  It is easier to leak urine during pregnancy. Tightening up and strengthening the pelvic muscles will help with this problem. Practice stopping your urination while you are going to the  bathroom. These are the same muscles you need to strengthen. It is also the muscles you would use as if you were trying to stop from passing gas. You can practice tightening these muscles up 10 times a set and repeating this about 3 times per day. Once you know what muscles to tighten up, do not perform these exercises during urination. It is more likely to contribute to an infection by backing up the urine.  Ask for help if you have financial, counseling, or nutritional needs during pregnancy. Your caregiver will be able to offer counseling for these needs as well as refer you for other special needs.  Your skin may become oily. If so, wash your face with mild soap, use non-greasy moisturizer and oil or cream based makeup. MEDICINES AND DRUG USE IN PREGNANCY  Take prenatal vitamins as directed. The vitamin should contain 1 milligram of folic acid. Keep all vitamins out of reach of children. Only a couple vitamins or tablets containing iron may be fatal to a baby or young child when ingested.  Avoid use of all medicines, including herbs, over-the-counter medicines, not prescribed or suggested by your caregiver. Only take over-the-counter or prescription medicines for pain, discomfort, or fever as directed by your caregiver. Do not use aspirin.  Let your caregiver also know about herbs you may be using.  Alcohol is related to a number of birth defects. This includes fetal alcohol syndrome. All alcohol, in any form, should be avoided completely. Smoking will cause low birth rate and premature babies.  Street or illegal drugs are very harmful to the baby. They are absolutely forbidden. A baby born to an addicted mother will be addicted at birth. The baby will go through the same withdrawal an adult does. SEEK MEDICAL CARE IF:  You have any concerns or worries during your pregnancy. It is better to call with your questions if you feel they cannot wait, rather than worry about them. SEEK IMMEDIATE  MEDICAL CARE IF:   An unexplained oral temperature above 102 F (38.9 C) develops, or as your caregiver suggests.  You have leaking of fluid from the vagina (birth canal). If leaking membranes are suspected, take your temperature and tell your caregiver of this when you call.  There is vaginal spotting, bleeding, or passing clots. Tell your caregiver of the amount and how many pads are used. Light spotting in pregnancy is common, especially following intercourse.  You develop a bad smelling vaginal discharge with a change in the color from clear to white.  You continue to feel   sick to your stomach (nauseated) and have no relief from remedies suggested. You vomit blood or coffee ground-like materials.  You lose more than 2 pounds of weight or gain more than 2 pounds of weight over 1 week, or as suggested by your caregiver.  You notice swelling of your face, hands, feet, or legs.  You get exposed to German measles and have never had them.  You are exposed to fifth disease or chickenpox.  You develop belly (abdominal) pain. Round ligament discomfort is a common non-cancerous (benign) cause of abdominal pain in pregnancy. Your caregiver still must evaluate you.  You develop a bad headache that does not go away.  You develop fever, diarrhea, pain with urination, or shortness of breath.  You develop visual problems, blurry, or double vision.  You fall or are in a car accident or any kind of trauma.  There is mental or physical violence at home. Document Released: 10/29/2001 Document Revised: 07/29/2012 Document Reviewed: 05/03/2009 Exit[employer] Patient Information 2014 [employer] Place 24-38 weeks prenatal visit patient instructions here.  

## 2013-08-05 NOTE — Progress Notes (Signed)
Pulse- 94 

## 2013-08-11 ENCOUNTER — Encounter: Payer: Medicaid Other | Admitting: Advanced Practice Midwife

## 2013-08-12 ENCOUNTER — Encounter (HOSPITAL_COMMUNITY): Payer: Self-pay | Admitting: *Deleted

## 2013-08-12 ENCOUNTER — Telehealth: Payer: Self-pay | Admitting: Obstetrics and Gynecology

## 2013-08-12 ENCOUNTER — Ambulatory Visit (HOSPITAL_COMMUNITY)
Admission: RE | Admit: 2013-08-12 | Discharge: 2013-08-12 | Disposition: A | Discharge status: Home / Self Care | Payer: Medicaid Other | Source: Ambulatory Visit | Attending: Obstetrics & Gynecology | Admitting: Obstetrics & Gynecology

## 2013-08-12 ENCOUNTER — Inpatient Hospital Stay (HOSPITAL_COMMUNITY)
Admission: AD | Admit: 2013-08-12 | Discharge: 2013-08-12 | Disposition: A | Discharge status: Home / Self Care | Payer: Medicaid Other | Source: Ambulatory Visit | Attending: Obstetrics & Gynecology | Admitting: Obstetrics & Gynecology

## 2013-08-12 ENCOUNTER — Ambulatory Visit (INDEPENDENT_AMBULATORY_CARE_PROVIDER_SITE_OTHER): Payer: Medicaid Other | Admitting: Obstetrics & Gynecology

## 2013-08-12 VITALS — BP 122/66 | Temp 98.7°F | Wt 220.0 lb

## 2013-08-12 DIAGNOSIS — O26879 Cervical shortening, unspecified trimester: Secondary | ICD-10-CM | POA: Insufficient documentation

## 2013-08-12 DIAGNOSIS — O4700 False labor before 37 completed weeks of gestation, unspecified trimester: Secondary | ICD-10-CM | POA: Insufficient documentation

## 2013-08-12 DIAGNOSIS — Z3492 Encounter for supervision of normal pregnancy, unspecified, second trimester: Secondary | ICD-10-CM

## 2013-08-12 DIAGNOSIS — O26872 Cervical shortening, second trimester: Secondary | ICD-10-CM

## 2013-08-12 LAB — POCT URINALYSIS DIP (DEVICE)
Bilirubin Urine: NEGATIVE
Glucose, UA: NEGATIVE mg/dL
Nitrite: NEGATIVE
Urobilinogen, UA: 0.2 mg/dL (ref 0.0–1.0)

## 2013-08-12 MED ORDER — BETAMETHASONE SOD PHOS & ACET 6 (3-3) MG/ML IJ SUSP
12.0000 mg | Freq: Once | INTRAMUSCULAR | Status: AC
  Administered 2013-08-12: 12 mg via INTRAMUSCULAR
  Filled 2013-08-12: qty 2

## 2013-08-12 MED ORDER — BETAMETHASONE SOD PHOS & ACET 6 (3-3) MG/ML IJ SUSP: 12.0000 mg | Freq: Once | INTRAMUSCULAR | Status: DC

## 2013-08-12 NOTE — Progress Notes (Signed)
Denise [name]  was seen today for an ultrasound appointment.  See full report in [facility].  Impression: Single IUP at 26 2/7 weeks Follow up due to shortened cervix.  Patient received course of betamethasone on 9/10.  Currnelty on vaginal progesterone.  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 U-shaped funneling noted. (previously 1.7 cm on last study)  Recommendations: There appears to be signficant change in cervical length since last evaluation on 9/10. Recommended repeat course of betamethasone and hosptalization over next 24-48 hrs and possible Magnesium sulfate for neuroprotection if patient noted to have frequent uterine contractions.  The patient declined admission. After counseling, the patient was willing to be evaluated in MAU for tocodynometry and repeat course of betamethasone as an outpatient.  Discussed with Dr. Macon Large who will evalaute the patient in MAU.  Alpha Gula, MD

## 2013-08-12 NOTE — Progress Notes (Signed)
Pulse: 89

## 2013-08-12 NOTE — MAU Provider Note (Signed)
Attestation of Attending Supervision of Advanced Practitioner (PA/CNM/NP): Evaluation and management procedures were performed by the Advanced Practitioner under my supervision and collaboration.  I have reviewed the Advanced Practitioner's note and chart, and I agree with the management and plan.  Asante Blanda, MD, FACOG Attending Obstetrician & Gynecologist Faculty Practice, [facility]  

## 2013-08-12 NOTE — Patient Instructions (Signed)

## 2013-08-12 NOTE — MAU Note (Addendum)
Patient seen in MFM today; was sent to MAU for fetal monitoring, betamethasone, and further evaluation for short cervix. Denies pain, bleeding.

## 2013-08-12 NOTE — MAU Provider Note (Addendum)
Chief Complaint:  No chief complaint on file.   First Provider Initiated Contact with Patient 08/12/13 1445      HPI: Denise [name] is a 22 y.o. G1P0 at [redacted]w[redacted]d sent from MFM after scheduled appointment.  Denies contractions, abdominal cramps, pelvic pressure. No leakage of fluid or vaginal bleeding. Good fetal movement.   Candis Shine, MD Physician Signed Maternal-Fetal Medicine Progress Notes Service date: 08/12/2013 2:29 PM  Related encounter: US OB TRANSVAGINAL from 08/12/2013 in [facility] MATERNAL FETAL CARE ULTRASOUND   Denise [name] was seen today for an ultrasound appointment. See full report in AS-OB/GYN.  Impression:  Single IUP at 26 2/7 weeks  Follow up due to shortened cervix. Patient received course of betamethasone on 9/10. Currnelty on vaginal progesterone.  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 U-shaped funneling noted. (previously 1.7 cm on last study)  Recommendations:  There appears to be signficant change in cervical length since last evaluation on 9/10.  Recommended repeat course of betamethasone and hosptalization over next 24-48 hrs and possible Magnesium sulfate for neuroprotection if patient noted to have frequent uterine contractions. The patient declined admission.  After counseling, the patient was willing to be evaluated in MAU for tocodynometry and repeat course of betamethasone as an outpatient.       Pregnancy Course: Followed at [facility] and on prometrium pv since dx of short cx at about 24 wks. Works at [employer] and has frequent breaks. On pelvic rest.  Past Medical History: Past Medical History  Diagnosis Date  . Asthma   . Trichomonas   . Arthritis     Past obstetric history: OB History  Gravida Para Term Preterm AB SAB TAB Ectopic Multiple Living  1             # Outcome Date GA Lbr Len/2nd Weight Sex Delivery Anes PTL Lv  1 CUR               Past Surgical History: Past Surgical History  Procedure Laterality Date  . No past surgeries        Family History: Family History  Problem Relation Age of Onset  . Arthritis Mother   . Diabetes Maternal Grandmother   . Arthritis Maternal Grandmother     Social History: History  Substance Use Topics  . Smoking status: Never Smoker   . Smokeless tobacco: Never Used  . Alcohol Use: No    Allergies: No Known Allergies  Meds:  Prescriptions prior to admission  Medication Sig Dispense Refill  . albuterol (PROVENTIL HFA;VENTOLIN HFA) 108 (90 BASE) MCG/ACT inhaler Inhale 2 puffs into the lungs every 6 (six) hours as needed for wheezing.  1 Inhaler  1  . Prenatal Vit-Fe Fumarate-FA (PRENATAL MULTIVITAMIN) TABS Take 1 tablet by mouth daily at 12 noon.      . progesterone (PROMETRIUM) 200 MG capsule Place 1 capsule (200 mg total) vaginally at bedtime.  90 capsule  1    ROS: Pertinent findings in history of present illness.  Physical Exam  Blood pressure 124/78, pulse 82, temperature 98.1 F (36.7 C), temperature source Oral, resp. rate 18, height 5\' 5"  (1.651 m), weight 99.791 kg (220 lb), last menstrual period 02/09/2013, SpO2 100.00%. GENERAL:Pleasant, obese female in no acute distress.  HEENT: normocephalic HEART: normal rate RESP: normal effort ABDOMEN: Soft, non-tender, gravid appropriate for gestational age EXTREMITIES: Nontender, no edema NEURO: alert and oriented   FHT:  Baseline 150 , moderate variability, accelerations present, no decelerations Contractions: none  Labs:  Results for orders placed in visit on 08/12/13 (from the past 24 hour(s))  POCT URINALYSIS DIP (DEVICE)     Status: Abnormal   Collection Time    08/12/13 12:10 PM      Result Value Range   Glucose, UA NEGATIVE  NEGATIVE mg/dL   Bilirubin Urine NEGATIVE  NEGATIVE   Ketones, ur NEGATIVE  NEGATIVE mg/dL   Specific Gravity, Urine 1.015  1.005 - 1.030   Hgb urine dipstick NEGATIVE  NEGATIVE   pH 7.0  5.0 - 8.0   Protein, ur NEGATIVE  NEGATIVE mg/dL   Urobilinogen, UA 0.2  0.0 - 1.0 mg/dL    Nitrite NEGATIVE  NEGATIVE   Leukocytes, UA SMALL (*) NEGATIVE    Imaging:  US Ob Limited  08/05/2013   OBSTETRICAL ULTRASOUND: This exam was performed within a [place] Ultrasound Department. The OB US report was generated in the AS system, and faxed to the ordering physician.   This report is also available in [employer] and in the [employer]. See AS Obstetric US report.  US Ob Follow Up  07/14/2013   OBSTETRICAL ULTRASOUND: This exam was performed within a [place] Ultrasound Department. The OB US report was generated in the AS system, and faxed to the ordering physician.   This report is also available in [employer] and in the [employer]. See AS Obstetric US report.  US Ob Transvaginal  08/12/2013   OBSTETRICAL ULTRASOUND: This exam was performed within a [place] Ultrasound Department. The OB US report was generated in the AS system, and faxed to the ordering physician.   This report is also available in [employer] and in the [employer]. See AS Obstetric US report.  US Ob Transvaginal  08/05/2013   OBSTETRICAL ULTRASOUND: This exam was performed within a [place] Ultrasound Department. The OB US report was generated in the AS system, and faxed to the ordering physician.   This report is also available in [employer] and in the [employer]. See AS Obstetric US report.  US Ob Transvaginal  07/28/2013   OBSTETRICAL ULTRASOUND: This exam was performed within a [place] Ultrasound Department. The OB US report was generated in the AS system, and faxed to the ordering physician.   This report is also available in [employer] and in the [employer]. See AS Obstetric US report.  US Ob Transvaginal  07/14/2013   OBSTETRICAL ULTRASOUND: This exam was performed within a [place] Ultrasound Department. The OB US report was generated in the AS system, and faxed to the  ordering physician.   This report is also available in [employer] and in the [employer]. See AS Obstetric US report.  MAU Course: Received BMZ 12 mg IM. EFM>1 hr.    Assessment: 1. Short cervix affecting pregnancy, second trimester   G1 @26wk2d   Plan: Discharge home with PTL precautions, pelvic rest.  Follow-up Information   Follow up with [facility] In 1 week. (Return here to MAU for second betamethasone injection 08/14/23, 3:30)        Medication List         albuterol 108 (90 BASE) MCG/ACT inhaler  Commonly known as:  PROVENTIL HFA;VENTOLIN HFA  Inhale 2 puffs into the lungs every 6 (six) hours as needed for wheezing.     prenatal multivitamin Tabs tablet  Take 1 tablet by mouth daily at 12 noon.     progesterone 200  MG capsule  Commonly known as:  PROMETRIUM  Place 1 capsule (200 mg total) vaginally at bedtime.       Follow-up Information   Follow up with [facility] In 1 week. (Return here to MAU for second betamethasone injection 08/14/23, 3:30)       Danae Orleans, CNM 08/12/2013 3:59 PM  TC to pt re: need to be on bedrest and stop working> please reinforce this when she comes back tomorrow.  Danae Orleans, CNM 08/12/2013 6:31 PM

## 2013-08-12 NOTE — Telephone Encounter (Signed)
TC to pt. She understands to be on complete bedrest and pelvic rest at least until her next PN visit. Work excuse written and she will pick that up when she comes here tomorrow for her BMZ#2. She says she is transferring care to [facility] and has appointment there next week.

## 2013-08-12 NOTE — MAU Provider Note (Signed)
Attestation of Attending Supervision of Advanced Practitioner (PA/CNM/NP): Evaluation and management procedures were performed by the Advanced Practitioner under my supervision and collaboration.  I have reviewed the Advanced Practitioner's note and chart, and I agree with the management and plan. MFM wants patient to be on pelvic rest and bedrest.  Jaynie Collins, MD, FACOG Attending Obstetrician & Gynecologist Faculty Practice, [facility] [place]

## 2013-08-12 NOTE — Progress Notes (Signed)
1.7 cm cx funneling on 9/18, [place] today

## 2013-08-12 NOTE — ED Notes (Signed)
Pt sent to MAU as she did not want to be admitted.  While calling report to MAU charge RN, pt stated she would walk herself down to MAU.  Did not want to be escorted.  Report given to charge nurse.  Notified of shortened CL and need for steroids.

## 2013-08-13 ENCOUNTER — Inpatient Hospital Stay (HOSPITAL_COMMUNITY)
Admission: AD | Admit: 2013-08-13 | Discharge: 2013-08-13 | Disposition: A | Discharge status: Home / Self Care | Payer: Medicaid Other | Source: Ambulatory Visit | Attending: Obstetrics & Gynecology | Admitting: Obstetrics & Gynecology

## 2013-08-13 DIAGNOSIS — O4700 False labor before 37 completed weeks of gestation, unspecified trimester: Secondary | ICD-10-CM | POA: Insufficient documentation

## 2013-08-13 MED ORDER — BETAMETHASONE SOD PHOS & ACET 6 (3-3) MG/ML IJ SUSP
12.0000 mg | Freq: Once | INTRAMUSCULAR | Status: AC
  Administered 2013-08-13: 12 mg via INTRAMUSCULAR
  Filled 2013-08-13: qty 2

## 2013-08-16 ENCOUNTER — Encounter (HOSPITAL_COMMUNITY): Payer: Self-pay

## 2013-08-16 ENCOUNTER — Inpatient Hospital Stay (EMERGENCY_DEPARTMENT_HOSPITAL)
Admission: AD | Admit: 2013-08-16 | Discharge: 2013-08-16 | Disposition: A | Discharge status: Home / Self Care | Payer: Medicaid Other | Source: Ambulatory Visit | Attending: Obstetrics and Gynecology | Admitting: Obstetrics and Gynecology

## 2013-08-16 ENCOUNTER — Inpatient Hospital Stay (HOSPITAL_COMMUNITY)
Admission: AD | Admit: 2013-08-16 | Discharge: 2013-08-16 | Disposition: A | Discharge status: Home / Self Care | Payer: Medicaid Other | Source: Ambulatory Visit | Attending: Obstetrics and Gynecology | Admitting: Obstetrics and Gynecology

## 2013-08-16 ENCOUNTER — Encounter (HOSPITAL_COMMUNITY): Payer: Self-pay | Admitting: *Deleted

## 2013-08-16 DIAGNOSIS — O26853 Spotting complicating pregnancy, third trimester: Secondary | ICD-10-CM

## 2013-08-16 DIAGNOSIS — R109 Unspecified abdominal pain: Secondary | ICD-10-CM | POA: Insufficient documentation

## 2013-08-16 DIAGNOSIS — O26872 Cervical shortening, second trimester: Secondary | ICD-10-CM

## 2013-08-16 DIAGNOSIS — O26873 Cervical shortening, third trimester: Secondary | ICD-10-CM

## 2013-08-16 DIAGNOSIS — O26879 Cervical shortening, unspecified trimester: Secondary | ICD-10-CM

## 2013-08-16 DIAGNOSIS — O9989 Other specified diseases and conditions complicating pregnancy, childbirth and the puerperium: Secondary | ICD-10-CM

## 2013-08-16 DIAGNOSIS — O4700 False labor before 37 completed weeks of gestation, unspecified trimester: Secondary | ICD-10-CM | POA: Insufficient documentation

## 2013-08-16 DIAGNOSIS — M549 Dorsalgia, unspecified: Secondary | ICD-10-CM

## 2013-08-16 DIAGNOSIS — M545 Low back pain, unspecified: Secondary | ICD-10-CM | POA: Insufficient documentation

## 2013-08-16 LAB — URINE MICROSCOPIC-ADD ON

## 2013-08-16 LAB — URINALYSIS, ROUTINE W REFLEX MICROSCOPIC
Bilirubin Urine: NEGATIVE
Nitrite: NEGATIVE
Protein, ur: NEGATIVE mg/dL
Specific Gravity, Urine: 1.025 (ref 1.005–1.030)
Urobilinogen, UA: 0.2 mg/dL (ref 0.0–1.0)

## 2013-08-16 NOTE — MAU Provider Note (Signed)
History     CSN: [id number]  Arrival date and time: 08/16/13 1049   None     Chief Complaint  Patient presents with  . Labor Eval  . Back Pain   HPI  [name] is 22 y.o. female; G1P0 at [redacted]w[redacted]d who presents to MAU for abdominal cramping and lower back pain; she has a shortened cervix with this pregnancy. She started feeling abdominal cramping on Sunday morning at 0100. She is not having any cramping at this time but rates her lower back pain an 8/10.  She denies anything for pain at this time. She came here to check to see if this is labor. At her last visit in MAU it was recommended that she stay in the hospital; however patient declined admission. She has not had intercourse recently; she is using progesterone suppositories daily and used it as prescribed last night. She has received Betamethasone twice; a total of 4 injections. She is scheduled to see Dr. Tenny Craw this Thursday for her first appointment; she left the women[facility] clinic because she was unhappy with the care.   OB History   Grav Para Term Preterm Abortions TAB SAB Ect Mult Living   1               Past Medical History  Diagnosis Date  . Asthma   . Trichomonas   . Arthritis     Past Surgical History  Procedure Laterality Date  . No past surgeries      Family History  Problem Relation Age of Onset  . Arthritis Mother   . Diabetes Maternal Grandmother   . Arthritis Maternal Grandmother     History  Substance Use Topics  . Smoking status: Never Smoker   . Smokeless tobacco: Never Used  . Alcohol Use: No    Allergies: No Known Allergies  Prescriptions prior to admission  Medication Sig Dispense Refill  . Prenatal Vit-Fe Fumarate-FA (PRENATAL MULTIVITAMIN) TABS Take 1 tablet by mouth daily at 12 noon.      . progesterone (PROMETRIUM) 200 MG capsule Place 1 capsule (200 mg total) vaginally at bedtime.  90 capsule  1  . albuterol (PROVENTIL HFA;VENTOLIN HFA) 108 (90 BASE) MCG/ACT inhaler Inhale 2  puffs into the lungs every 6 (six) hours as needed for wheezing.  1 Inhaler  1   Review of Systems  Constitutional: Negative for fever and chills.  Eyes: Negative for blurred vision.  Gastrointestinal: Negative for nausea, vomiting, abdominal pain, diarrhea and constipation.  Genitourinary: Negative for dysuria, urgency, frequency and hematuria.  Musculoskeletal: Positive for back pain.       + Lower back pain   Neurological: Negative for headaches.   Physical Exam   Blood pressure 116/76, pulse 97, resp. rate 20, height 5\' 5"  (1.651 m), weight 99.247 kg (218 lb 12.8 oz), last menstrual period 02/09/2013, SpO2 100.00%.  Physical Exam  Constitutional: She is oriented to person, place, and time. She appears well-developed and well-nourished. No distress.  Respiratory: Effort normal.  GI: Soft. She exhibits no distension. There is no tenderness. There is no rebound and no guarding.  Neurological: She is alert and oriented to person, place, and time.  Skin: Skin is warm and dry. She is not diaphoretic.   Fetal Tracing: Baseline: 150 bpm  Variability: moderate  Accelerations: 15x15 Decelerations: None  Toco: No contractions   Dilation: Closed Effacement: Thick Cervical position: Middle Station: -3 Presentation: Indeterminate  Exam by: Venia Carbon FNP-C  MAU Course  Procedures None  MDM NST Cervical exam Consulted with Dr. Dareen Piano at 1310; pt does not want to take any medication for pain management. Will give her pre-term labor precautions.   Assessment and Plan  A: Back pain complicating pregnancy  Pre-term labor; pt has received betamethasone   P: Discharge home Resume bed rest at home Keep your scheduled appointment with Dr.Ross Pre-term labor precautions discussed Return to MAU if symptoms including back pain worsen  Pelvic rest discussed   RASCH, JENNIFER IRENE FNP-C 08/16/2013, 12:36 PM

## 2013-08-16 NOTE — MAU Provider Note (Signed)
  History     CSN: [id number]  Arrival date and time: 08/16/13 4098   First Provider Initiated Contact with Patient 08/16/13 2006      Chief Complaint  Patient presents with  . Vaginal Bleeding  . Abdominal Cramping   Vaginal Bleeding  Abdominal Cramping    Denise [name] is a 22 y.o. G1P0 at [redacted]w[redacted]d who presents today with spotting. She states that around 1800 today she noticed some pink spotting while wiping. It has continued, and she has on a pad with a tiny amount of spotting on it. She states that she has constant back pain. She was seen today, and had a cervical exam. She has a history of a short cervix.   Past Medical History  Diagnosis Date  . Asthma   . Trichomonas   . Arthritis     Past Surgical History  Procedure Laterality Date  . No past surgeries      Family History  Problem Relation Age of Onset  . Arthritis Mother   . Diabetes Maternal Grandmother   . Arthritis Maternal Grandmother     History  Substance Use Topics  . Smoking status: Never Smoker   . Smokeless tobacco: Never Used  . Alcohol Use: No    Allergies: No Known Allergies  Prescriptions prior to admission  Medication Sig Dispense Refill  . Prenatal Vit-Fe Fumarate-FA (PRENATAL MULTIVITAMIN) TABS Take 1 tablet by mouth daily at 12 noon.      . progesterone (PROMETRIUM) 200 MG capsule Place 1 capsule (200 mg total) vaginally at bedtime.  90 capsule  1  . albuterol (PROVENTIL HFA;VENTOLIN HFA) 108 (90 BASE) MCG/ACT inhaler Inhale 2 puffs into the lungs every 6 (six) hours as needed for wheezing.  1 Inhaler  1    Review of Systems  Genitourinary: Positive for vaginal bleeding.   Physical Exam   Blood pressure 128/89, pulse 92, temperature 98.2 F (36.8 C), temperature source Oral, resp. rate 18, last menstrual period 02/09/2013.  Physical Exam  Nursing note and vitals reviewed. Constitutional: She is oriented to person, place, and time. She appears well-developed and  well-nourished. No distress.  Cardiovascular: Normal rate.   Respiratory: Effort normal.  GI: Soft. There is no tenderness.  Genitourinary:   External: no lesion Vagina: small amount of clear discharge Cervix: deferred pending amnisure Uterus: AGA    Neurological: She is alert and oriented to person, place, and time.  Skin: Skin is warm and dry.  Psychiatric: She has a normal mood and affect.   Cervix: FT at ext os, closed internal os/ 75/-3  Fht: 1490, moderate with 15x15 accels, no decels Toco: no UCs MAU Course  Procedures  Results for orders placed during the hospital encounter of 08/16/13 (from the past 24 hour(s))  AMNISURE RUPTURE OF MEMBRANE (ROM)     Status: None   Collection Time    08/16/13  8:15 PM      Result Value Range   Amnisure ROM NEGATIVE      2052: D/W Dr. Dareen Piano, will DC home and have her FU in the office as scheduled for Thursday.   Assessment and Plan   1. Short cervix affecting pregnancy, third trimester   2. Spotting in pregnancy, antepartum condition or complication, third trimester    PTL danger signs Fetal kick counts FU with the office as planned    Tawnya Crook 08/16/2013, 8:17 PM

## 2013-08-16 NOTE — MAU Note (Signed)
Patient state she has had back pain since 9-26 and started having abdominal cramping over the weekend. States the back pain is constant and more intense and having a lot of lower abdominal pressure.

## 2013-08-17 LAB — URINE CULTURE: Colony Count: 30000

## 2013-08-17 NOTE — MAU Provider Note (Signed)
cc

## 2013-08-18 ENCOUNTER — Ambulatory Visit (INDEPENDENT_AMBULATORY_CARE_PROVIDER_SITE_OTHER): Payer: Self-pay | Admitting: Pediatrics

## 2013-08-18 DIAGNOSIS — Z7189 Other specified counseling: Secondary | ICD-10-CM

## 2013-08-18 NOTE — Progress Notes (Signed)
Pre natal counseling done 

## 2013-08-24 LAB — OB RESULTS CONSOLE RPR: RPR: NONREACTIVE

## 2013-08-24 LAB — OB RESULTS CONSOLE GC/CHLAMYDIA: Chlamydia: NEGATIVE

## 2013-09-02 NOTE — MAU Provider Note (Signed)
Chart reviewed and agree with management and plan.  

## 2013-09-22 ENCOUNTER — Encounter: Payer: Self-pay | Admitting: *Deleted

## 2013-09-23 ENCOUNTER — Other Ambulatory Visit: Payer: Self-pay

## 2013-10-12 LAB — OB RESULTS CONSOLE GBS: GBS: NEGATIVE

## 2013-10-25 ENCOUNTER — Inpatient Hospital Stay (HOSPITAL_COMMUNITY)
Admission: AD | Admit: 2013-10-25 | Discharge: 2013-10-25 | Disposition: A | Discharge status: Home / Self Care | Payer: Medicaid Other | Source: Ambulatory Visit | Attending: Obstetrics and Gynecology | Admitting: Obstetrics and Gynecology

## 2013-10-25 ENCOUNTER — Encounter (HOSPITAL_COMMUNITY): Payer: Self-pay | Admitting: *Deleted

## 2013-10-25 DIAGNOSIS — O133 Gestational [pregnancy-induced] hypertension without significant proteinuria, third trimester: Secondary | ICD-10-CM

## 2013-10-25 DIAGNOSIS — O139 Gestational [pregnancy-induced] hypertension without significant proteinuria, unspecified trimester: Secondary | ICD-10-CM | POA: Insufficient documentation

## 2013-10-25 DIAGNOSIS — O4700 False labor before 37 completed weeks of gestation, unspecified trimester: Secondary | ICD-10-CM | POA: Insufficient documentation

## 2013-10-25 LAB — CBC
HCT: 36.6 % (ref 36.0–46.0)
MCH: 25.4 pg — ABNORMAL LOW (ref 26.0–34.0)
MCV: 76.1 fL — ABNORMAL LOW (ref 78.0–100.0)
RBC: 4.81 MIL/uL (ref 3.87–5.11)
RDW: 15.7 % — ABNORMAL HIGH (ref 11.5–15.5)
WBC: 5.6 10*3/uL (ref 4.0–10.5)

## 2013-10-25 LAB — COMPREHENSIVE METABOLIC PANEL
AST: 21 U/L (ref 0–37)
Alkaline Phosphatase: 139 U/L — ABNORMAL HIGH (ref 39–117)
BUN: 5 mg/dL — ABNORMAL LOW (ref 6–23)
CO2: 21 mEq/L (ref 19–32)
Chloride: 106 mEq/L (ref 96–112)
Creatinine, Ser: 0.7 mg/dL (ref 0.50–1.10)
GFR calc non Af Amer: >90 mL/min (ref >90)
Potassium: 4.2 mEq/L (ref 3.5–5.1)
Total Bilirubin: 0.1 mg/dL — ABNORMAL LOW (ref 0.3–1.2)

## 2013-10-25 LAB — URINALYSIS, ROUTINE W REFLEX MICROSCOPIC
Bilirubin Urine: NEGATIVE
Hgb urine dipstick: NEGATIVE
Protein, ur: 100 mg/dL — AB
Urobilinogen, UA: 0.2 mg/dL (ref 0.0–1.0)

## 2013-10-25 NOTE — MAU Provider Note (Signed)
History     CSN: [id number]  Arrival date and time: 10/25/13 1449   None     Chief Complaint  Patient presents with  . Labor Eval   HPI This is a 22 y.o. female at [redacted]w[redacted]d who presents for labor check. She was noted to have elevated BP, and does state her feet have been swollen. Denies H/A or vision changes.   RN Note: Pt states she started having U/C's today about 0930 and is 3 minutes apart. Denies vaginal bleeding or ROM. Good fetal movement.      OB History   Grav Para Term Preterm Abortions TAB SAB Ect Mult Living   1         0      Past Medical History  Diagnosis Date  . Asthma   . Trichomonas   . Arthritis     Past Surgical History  Procedure Laterality Date  . No past surgeries      Family History  Problem Relation Age of Onset  . Arthritis Mother   . Diabetes Maternal Grandmother   . Arthritis Maternal Grandmother     History  Substance Use Topics  . Smoking status: Never Smoker   . Smokeless tobacco: Never Used  . Alcohol Use: No    Allergies: No Known Allergies  Prescriptions prior to admission  Medication Sig Dispense Refill  . Prenatal Vit-Fe Fumarate-FA (PRENATAL MULTIVITAMIN) TABS Take 1 tablet by mouth daily at 12 noon.      Marland Kitchen albuterol (PROVENTIL HFA;VENTOLIN HFA) 108 (90 BASE) MCG/ACT inhaler Inhale 2 puffs into the lungs every 6 (six) hours as needed for wheezing.  1 Inhaler  1    Review of Systems  Constitutional: Negative for fever, chills and malaise/fatigue.  Eyes: Negative for blurred vision and double vision.  Cardiovascular: Positive for leg swelling.  Gastrointestinal: Positive for abdominal pain. Negative for nausea, vomiting, diarrhea and constipation.  Genitourinary: Negative for dysuria.  Musculoskeletal: Negative for myalgias.  Neurological: Negative for dizziness, focal weakness and headaches.   Physical Exam   Blood pressure 142/101, pulse 87, temperature 98.3 F (36.8 C), temperature source Oral, resp. rate 20,  height 5\' 5"  (1.651 m), weight 107.049 kg (236 lb), last menstrual period 02/09/2013, SpO2 100.00%.  Filed Vitals:   10/25/13 1502 10/25/13 1606 10/25/13 1659 10/25/13 1749  BP: 147/91 142/101 139/77 131/85  Pulse: 94 87 89 92  Temp: 98.3 F (36.8 C)   98 F (36.7 C)  TempSrc: Oral   Oral  Resp: 20   18  Height: 5\' 5"  (1.651 m)     Weight: 107.049 kg (236 lb)     SpO2: 100%       Physical Exam  Constitutional: She is oriented to person, place, and time. She appears well-developed and well-nourished. No distress.  Cardiovascular: Normal rate.   Respiratory: Effort normal.  GI: Soft. She exhibits no distension and no mass. There is no tenderness. There is no rebound and no guarding.  Genitourinary: Vagina normal and uterus normal. No vaginal discharge found.  Dilation: 2.5 Effacement (%): 50 Cervical Position: Middle Station: -2 Presentation: Vertex Exam by:: L.Paschal, RN   Musculoskeletal: Normal range of motion.  Neurological: She is alert and oriented to person, place, and time.  Skin: Skin is warm and dry.  Psychiatric: She has a normal mood and affect.  FHR reactive UCs every 2-4 minutes  MAU Course  Procedures  MDM PIH labs ordered  >> Results for orders placed during the hospital  encounter of 10/25/13 (from the past 24 hour(s))  URINALYSIS, ROUTINE W REFLEX MICROSCOPIC     Status: Abnormal   Collection Time    10/25/13  2:57 PM      Result Value Range   Color, Urine YELLOW  YELLOW   APPearance HAZY (*) CLEAR   Specific Gravity, Urine 1.020  1.005 - 1.030   pH 6.5  5.0 - 8.0   Glucose, UA NEGATIVE  NEGATIVE mg/dL   Hgb urine dipstick NEGATIVE  NEGATIVE   Bilirubin Urine NEGATIVE  NEGATIVE   Ketones, ur NEGATIVE  NEGATIVE mg/dL   Protein, ur 161 (*) NEGATIVE mg/dL   Urobilinogen, UA 0.2  0.0 - 1.0 mg/dL   Nitrite NEGATIVE  NEGATIVE   Leukocytes, UA TRACE (*) NEGATIVE  URINE MICROSCOPIC-ADD ON     Status: Abnormal   Collection Time    10/25/13  2:57 PM       Result Value Range   Squamous Epithelial / LPF MANY (*) RARE   WBC, UA 11-20  <3 WBC/hpf   RBC / HPF 0-2  <3 RBC/hpf   Bacteria, UA FEW (*) RARE  CBC     Status: Abnormal   Collection Time    10/25/13  4:09 PM      Result Value Range   WBC 5.6  4.0 - 10.5 K/uL   RBC 4.81  3.87 - 5.11 MIL/uL   Hemoglobin 12.2  12.0 - 15.0 g/dL   HCT 09.6  04.5 - 40.9 %   MCV 76.1 (*) 78.0 - 100.0 fL   MCH 25.4 (*) 26.0 - 34.0 pg   MCHC 33.3  30.0 - 36.0 g/dL   RDW 81.1 (*) 91.4 - 78.2 %   Platelets 210  150 - 400 K/uL  COMPREHENSIVE METABOLIC PANEL     Status: Abnormal   Collection Time    10/25/13  4:09 PM      Result Value Range   Sodium 137  135 - 145 mEq/L   Potassium 4.2  3.5 - 5.1 mEq/L   Chloride 106  96 - 112 mEq/L   CO2 21  19 - 32 mEq/L   Glucose, Bld 80  70 - 99 mg/dL   BUN 5 (*) 6 - 23 mg/dL   Creatinine, Ser 9.56  0.50 - 1.10 mg/dL   Calcium 9.6  8.4 - 21.3 mg/dL   Total Protein 6.4  6.0 - 8.3 g/dL   Albumin 2.8 (*) 3.5 - 5.2 g/dL   AST 21  0 - 37 U/L   ALT 19  0 - 35 U/L   Alkaline Phosphatase 139 (*) 39 - 117 U/L   Total Bilirubin 0.1 (*) 0.3 - 1.2 mg/dL   GFR calc non Af Amer >90  >90 mL/min   GFR calc Af Amer >90  >90 mL/min     Assessment and Plan  A:  SIUP at [redacted]w[redacted]d       Gestational hypertension, labile       Not in labor  P:  Discussed with Dr Claiborne Billings       Discharge home with 24 hr urine       Pt is to call office tomorrow for followup   [facility] 10/25/2013, 4:42 PM

## 2013-10-25 NOTE — MAU Note (Signed)
Pt states she started having U/C's today about 0930 and is 3 minutes apart.  Denies vaginal bleeding or ROM.  Good fetal movement.

## 2013-10-26 LAB — URINE CULTURE

## 2013-10-28 ENCOUNTER — Inpatient Hospital Stay (HOSPITAL_COMMUNITY)
Admission: AD | Admit: 2013-10-28 | Discharge: 2013-10-30 | DRG: 774 | Disposition: A | Discharge status: Home / Self Care | Payer: Medicaid Other | Source: Ambulatory Visit | Attending: Obstetrics and Gynecology | Admitting: Obstetrics and Gynecology

## 2013-10-28 ENCOUNTER — Encounter (HOSPITAL_COMMUNITY): Payer: Self-pay | Admitting: *Deleted

## 2013-10-28 DIAGNOSIS — IMO0002 Reserved for concepts with insufficient information to code with codable children: Principal | ICD-10-CM | POA: Diagnosis present

## 2013-10-28 DIAGNOSIS — IMO0001 Reserved for inherently not codable concepts without codable children: Secondary | ICD-10-CM

## 2013-10-28 DIAGNOSIS — Z3492 Encounter for supervision of normal pregnancy, unspecified, second trimester: Secondary | ICD-10-CM

## 2013-10-28 HISTORY — DX: Gestational (pregnancy-induced) hypertension without significant proteinuria, unspecified trimester: O13.9

## 2013-10-28 LAB — CBC
MCHC: 33.8 g/dL (ref 30.0–36.0)
RDW: 15.5 % (ref 11.5–15.5)
WBC: 5.3 10*3/uL (ref 4.0–10.5)

## 2013-10-28 LAB — TYPE AND SCREEN: Antibody Screen: NEGATIVE

## 2013-10-28 MED ORDER — EPHEDRINE 5 MG/ML INJ
10.0000 mg | INTRAVENOUS | Status: DC | PRN
  Filled 2013-10-28: qty 2

## 2013-10-28 MED ORDER — OXYCODONE-ACETAMINOPHEN 5-325 MG PO TABS: 1.0000 | ORAL_TABLET | ORAL | Status: DC | PRN

## 2013-10-28 MED ORDER — OXYTOCIN BOLUS FROM INFUSION
500.0000 mL | INTRAVENOUS | Status: DC
  Administered 2013-10-28: 500 mL via INTRAVENOUS

## 2013-10-28 MED ORDER — TERBUTALINE SULFATE 1 MG/ML IJ SOLN: 0.2500 mg | Freq: Once | INTRAMUSCULAR | Status: AC | PRN

## 2013-10-28 MED ORDER — FENTANYL 2.5 MCG/ML BUPIVACAINE 1/10 % EPIDURAL INFUSION (WH - ANES): 14.0000 mL/h | INTRAMUSCULAR | Status: DC | PRN

## 2013-10-28 MED ORDER — ACETAMINOPHEN 325 MG PO TABS: 650.0000 mg | ORAL_TABLET | ORAL | Status: DC | PRN

## 2013-10-28 MED ORDER — LIDOCAINE HCL (PF) 1 % IJ SOLN
30.0000 mL | INTRAMUSCULAR | Status: DC | PRN
  Administered 2013-10-28: 30 mL via SUBCUTANEOUS
  Filled 2013-10-28 (×2): qty 30

## 2013-10-28 MED ORDER — LACTATED RINGERS IV SOLN: 500.0000 mL | INTRAVENOUS | Status: DC | PRN

## 2013-10-28 MED ORDER — DIPHENHYDRAMINE HCL 50 MG/ML IJ SOLN: 12.5000 mg | INTRAMUSCULAR | Status: DC | PRN

## 2013-10-28 MED ORDER — OXYTOCIN 40 UNITS IN LACTATED RINGERS INFUSION - SIMPLE MED
62.5000 mL/h | INTRAVENOUS | Status: DC
  Filled 2013-10-28: qty 1000

## 2013-10-28 MED ORDER — LACTATED RINGERS IV SOLN
INTRAVENOUS | Status: DC
  Administered 2013-10-28: 17:00:00 via INTRAVENOUS

## 2013-10-28 MED ORDER — IBUPROFEN 600 MG PO TABS
600.0000 mg | ORAL_TABLET | Freq: Four times a day (QID) | ORAL | Status: DC | PRN
  Administered 2013-10-29: 600 mg via ORAL
  Filled 2013-10-28: qty 1

## 2013-10-28 MED ORDER — PHENYLEPHRINE 40 MCG/ML (10ML) SYRINGE FOR IV PUSH (FOR BLOOD PRESSURE SUPPORT)
80.0000 ug | PREFILLED_SYRINGE | INTRAVENOUS | Status: DC | PRN
  Filled 2013-10-28: qty 2

## 2013-10-28 MED ORDER — OXYTOCIN 40 UNITS IN LACTATED RINGERS INFUSION - SIMPLE MED
1.0000 m[IU]/min | INTRAVENOUS | Status: DC
  Administered 2013-10-28: 2 m[IU]/min via INTRAVENOUS
  Filled 2013-10-28: qty 1000

## 2013-10-28 MED ORDER — CITRIC ACID-SODIUM CITRATE 334-500 MG/5ML PO SOLN: 30.0000 mL | ORAL | Status: DC | PRN

## 2013-10-28 MED ORDER — ONDANSETRON HCL 4 MG/2ML IJ SOLN: 4.0000 mg | Freq: Four times a day (QID) | INTRAMUSCULAR | Status: DC | PRN

## 2013-10-28 MED ORDER — LACTATED RINGERS IV SOLN: 500.0000 mL | Freq: Once | INTRAVENOUS | Status: DC

## 2013-10-28 MED ORDER — BUTORPHANOL TARTRATE 1 MG/ML IJ SOLN: 1.0000 mg | INTRAMUSCULAR | Status: DC | PRN

## 2013-10-28 NOTE — H&P (Signed)
22 y.o. [redacted]w[redacted]d  G1P0 comes in for preeclampsia at term.  Otherwise has good fetal movement and no bleeding.  Recent 24 hour urine protein was 519 mg and 2 +on dip in office today.  No other s/s preeclampsia except now elevated blood pressures.  Past Medical History  Diagnosis Date  . Asthma   . Trichomonas   . Arthritis     Past Surgical History  Procedure Laterality Date  . No past surgeries      OB History  Gravida Para Term Preterm AB SAB TAB Ectopic Multiple Living  1         0    # Outcome Date GA Lbr Len/2nd Weight Sex Delivery Anes PTL Lv  1 CUR               History   Social History  . Marital Status: Single    Spouse Name: N/A    Number of Children: N/A  . Years of Education: N/A   Occupational History  . Not on file.   Social History Main Topics  . Smoking status: Never Smoker   . Smokeless tobacco: Never Used  . Alcohol Use: No  . Drug Use: No  . Sexual Activity: Not Currently   Other Topics Concern  . Not on file   Social History Narrative  . No narrative on file   Review of patient's allergies indicates no known allergies.    Prenatal Transfer Tool  Maternal Diabetes: No Genetic Screening: Declined Maternal Ultrasounds/Referrals: Normal Fetal Ultrasounds or other Referrals:  None Maternal Substance Abuse:  No Significant Maternal Medications:  None Significant Maternal Lab Results: None  Other PNC: Pt followed for funnelling of cervix but no preterm labor.    Filed Vitals:   10/28/13 1556  BP: 160/99  Pulse: 95  Temp:   Resp: 18     Lungs/Cor:  NAD Abdomen:  soft, gravid Ex:  no cords, erythema SVE:  3/80/-2, AROM clear FHTs:  150d, good STV, NST R Toco:  qocc   A/P   Term preeclampsia- for induction.  GBS neg.  Tallie Hevia A

## 2013-10-28 NOTE — MAU Provider Note (Signed)
agree

## 2013-10-29 ENCOUNTER — Encounter (HOSPITAL_COMMUNITY): Payer: Self-pay | Admitting: *Deleted

## 2013-10-29 LAB — CBC
HCT: 30.1 % — ABNORMAL LOW (ref 36.0–46.0)
MCH: 25.5 pg — ABNORMAL LOW (ref 26.0–34.0)
MCHC: 33.6 g/dL (ref 30.0–36.0)
Platelets: 233 10*3/uL (ref 150–400)

## 2013-10-29 LAB — HEPATITIS B SURFACE ANTIGEN: Hepatitis B Surface Ag: NEGATIVE

## 2013-10-29 LAB — RUBELLA SCREEN: Rubella: 20.3 Index — ABNORMAL HIGH (ref <0.90)

## 2013-10-29 LAB — RPR: RPR Ser Ql: NONREACTIVE

## 2013-10-29 MED ORDER — WITCH HAZEL-GLYCERIN EX PADS: 1.0000 "application " | MEDICATED_PAD | CUTANEOUS | Status: DC | PRN

## 2013-10-29 MED ORDER — ONDANSETRON HCL 4 MG/2ML IJ SOLN: 4.0000 mg | INTRAMUSCULAR | Status: DC | PRN

## 2013-10-29 MED ORDER — ONDANSETRON HCL 4 MG PO TABS: 4.0000 mg | ORAL_TABLET | ORAL | Status: DC | PRN

## 2013-10-29 MED ORDER — SODIUM CHLORIDE 0.9 % IJ SOLN: 3.0000 mL | Freq: Two times a day (BID) | INTRAMUSCULAR | Status: DC

## 2013-10-29 MED ORDER — DIBUCAINE 1 % RE OINT: 1.0000 "application " | TOPICAL_OINTMENT | RECTAL | Status: DC | PRN

## 2013-10-29 MED ORDER — DIPHENHYDRAMINE HCL 25 MG PO CAPS: 25.0000 mg | ORAL_CAPSULE | Freq: Four times a day (QID) | ORAL | Status: DC | PRN

## 2013-10-29 MED ORDER — OXYCODONE-ACETAMINOPHEN 5-325 MG PO TABS: 1.0000 | ORAL_TABLET | ORAL | Status: DC | PRN

## 2013-10-29 MED ORDER — SIMETHICONE 80 MG PO CHEW: 80.0000 mg | CHEWABLE_TABLET | ORAL | Status: DC | PRN

## 2013-10-29 MED ORDER — MEASLES, MUMPS & RUBELLA VAC ~~LOC~~ INJ
0.5000 mL | INJECTION | Freq: Once | SUBCUTANEOUS | Status: DC
  Filled 2013-10-29: qty 0.5

## 2013-10-29 MED ORDER — METHYLERGONOVINE MALEATE 0.2 MG/ML IJ SOLN: 0.2000 mg | INTRAMUSCULAR | Status: DC | PRN

## 2013-10-29 MED ORDER — METHYLERGONOVINE MALEATE 0.2 MG PO TABS: 0.2000 mg | ORAL_TABLET | ORAL | Status: DC | PRN

## 2013-10-29 MED ORDER — PRENATAL MULTIVITAMIN CH
1.0000 | ORAL_TABLET | Freq: Every day | ORAL | Status: DC
  Administered 2013-10-29 – 2013-10-30 (×2): 1 via ORAL
  Filled 2013-10-29 (×2): qty 1

## 2013-10-29 MED ORDER — SENNOSIDES-DOCUSATE SODIUM 8.6-50 MG PO TABS
2.0000 | ORAL_TABLET | ORAL | Status: DC
  Administered 2013-10-30: 2 via ORAL
  Filled 2013-10-29: qty 2

## 2013-10-29 MED ORDER — LANOLIN HYDROUS EX OINT: TOPICAL_OINTMENT | CUTANEOUS | Status: DC | PRN

## 2013-10-29 MED ORDER — BENZOCAINE-MENTHOL 20-0.5 % EX AERO
1.0000 "application " | INHALATION_SPRAY | CUTANEOUS | Status: DC | PRN
  Administered 2013-10-29: 1 via TOPICAL
  Filled 2013-10-29: qty 56

## 2013-10-29 MED ORDER — ZOLPIDEM TARTRATE 5 MG PO TABS: 5.0000 mg | ORAL_TABLET | Freq: Every evening | ORAL | Status: DC | PRN

## 2013-10-29 MED ORDER — SODIUM CHLORIDE 0.9 % IV SOLN: 250.0000 mL | INTRAVENOUS | Status: DC | PRN

## 2013-10-29 MED ORDER — IBUPROFEN 800 MG PO TABS
800.0000 mg | ORAL_TABLET | Freq: Three times a day (TID) | ORAL | Status: DC
  Administered 2013-10-29 – 2013-10-30 (×4): 800 mg via ORAL
  Filled 2013-10-29 (×7): qty 1

## 2013-10-29 MED ORDER — FERROUS SULFATE 325 (65 FE) MG PO TABS
325.0000 mg | ORAL_TABLET | Freq: Two times a day (BID) | ORAL | Status: DC
  Administered 2013-10-29 – 2013-10-30 (×3): 325 mg via ORAL
  Filled 2013-10-29 (×4): qty 1

## 2013-10-29 MED ORDER — MAGNESIUM HYDROXIDE 400 MG/5ML PO SUSP: 30.0000 mL | ORAL | Status: DC | PRN

## 2013-10-29 MED ORDER — INFLUENZA VAC SPLIT QUAD 0.5 ML IM SUSP: 0.5000 mL | INTRAMUSCULAR | Status: DC

## 2013-10-29 MED ORDER — SODIUM CHLORIDE 0.9 % IJ SOLN: 3.0000 mL | INTRAMUSCULAR | Status: DC | PRN

## 2013-10-29 MED ORDER — ALBUTEROL SULFATE HFA 108 (90 BASE) MCG/ACT IN AERS: 2.0000 | INHALATION_SPRAY | Freq: Four times a day (QID) | RESPIRATORY_TRACT | Status: DC | PRN

## 2013-10-29 MED ORDER — TETANUS-DIPHTH-ACELL PERTUSSIS 5-2.5-18.5 LF-MCG/0.5 IM SUSP
0.5000 mL | Freq: Once | INTRAMUSCULAR | Status: AC
  Administered 2013-10-29: 0.5 mL via INTRAMUSCULAR
  Filled 2013-10-29: qty 0.5

## 2013-10-29 NOTE — Progress Notes (Addendum)
PPD#1 Patient is eating, ambulating, voiding.  Pain control is good.  Appropriate lochia.  No complaints.  No HA/vision change/RUQ pain.  Filed Vitals:   10/29/13 0032 10/29/13 0058 10/29/13 0200 10/29/13 0600  BP: 135/93 137/84 142/85 122/79  Pulse: 84 86 90 91  Temp:  98.1 F (36.7 C) 98.2 F (36.8 C) 98 F (36.7 C)  TempSrc:  Oral Oral Oral  Resp: 20 18 18 20   Height:      Weight:        Fundus firm Perineum without swelling. No CT  Lab Results  Component Value Date   WBC 8.2 10/29/2013   HGB 10.1* 10/29/2013   HCT 30.1* 10/29/2013   MCV 76.0* 10/29/2013   PLT 233 10/29/2013    --/--/B POS (12/11 1615)  A/P Post partum day #1. Mild range BPs will continue to monitor.  Routine care.  Expect d/c 12/13  [place], [place]

## 2013-10-30 NOTE — Discharge Summary (Signed)
Obstetric Discharge Summary Reason for Admission: induction of labor Prenatal Procedures: Preeclampsia and ultrasound Intrapartum Procedures: spontaneous vaginal delivery Postpartum Procedures: none Complications-Operative and Postpartum: midline  degree perineal laceration Hemoglobin  Date Value Range Status  10/29/2013 10.1* 12.0 - 15.0 g/dL Final     HCT  Date Value Range Status  10/29/2013 30.1* 36.0 - 46.0 % Final    Physical Exam:  General: alert and cooperative Lochia: appropriate Uterine Fundus: firm DVT Evaluation: No evidence of DVT seen on physical exam.  Discharge Diagnoses: Term Pregnancy-delivered  Discharge Information: Date: 10/30/2013 Activity: pelvic rest Diet: routine Medications: PNV and Ibuprofen Condition: stable Instructions: refer to practice specific booklet Discharge to: home Follow-up Information   Follow up with HORVATH,MICHELLE A, MD In 4 weeks.   Specialty:  Obstetrics and Gynecology   Contact information:   [address] [place] [address] [phone]       Newborn Data: Live born female  Birth Weight: 6 lb 5.6 oz (2880 g) APGAR: 8, 9  Home with mother.  Philip Aspen 10/30/2013, 9:18 AM

## 2013-11-02 NOTE — Progress Notes (Signed)
Post discharge chart review completed.  

## 2014-04-30 ENCOUNTER — Ambulatory Visit (INDEPENDENT_AMBULATORY_CARE_PROVIDER_SITE_OTHER): Payer: 59 | Admitting: Family Medicine

## 2014-04-30 VITALS — BP 128/80 | HR 78 | Temp 97.9°F | Resp 16 | Ht 66.0 in | Wt 230.2 lb

## 2014-04-30 DIAGNOSIS — Z111 Encounter for screening for respiratory tuberculosis: Secondary | ICD-10-CM

## 2014-04-30 NOTE — Progress Notes (Signed)
[facility] [address], [place] [address] [phone]  Date:  04/30/2014   Name:  Denise [name]   DOB:  Mar 04, 1991   MRN:  [id number]  PCP:  No PCP Per Patient    Chief Complaint: PPD Placement   History of Present Illness:  Denise [name] is a 23 y.o. 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 She has been tested several times and has always been negative.  She delivered a healthy baby girl 6 months ago. She is generally healthy.  She is quite afraid of needles and would like to avoid a PPD if possible  Patient Active Problem List   Diagnosis Date Noted  . Postpartum state 10/29/2013  . Active labor 10/28/2013  . Short cervix affecting pregnancy 07/28/2013  . Other current maternal conditions classifiable elsewhere, antepartum 07/15/2013  . Supervision of normal pregnancy 05/19/2013  . 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 Trichomonas vaginitis 04/15/2013  . Asthma 04/29/2012    Past Medical History  Diagnosis Date  . Asthma   . Trichomonas   . Arthritis   . Pregnancy induced hypertension     Pre-E    Past Surgical History  Procedure Laterality Date  . No past surgeries      History  Substance Use Topics  . Smoking status: Never Smoker   . Smokeless tobacco: Never Used  . Alcohol Use: No    Family History  Problem Relation Age of Onset  . Arthritis Mother   . Diabetes Maternal Grandmother   . Arthritis Maternal Grandmother     No Known Allergies  Medication list has been reviewed and updated.  Current Outpatient Prescriptions on File Prior to Visit  Medication Sig Dispense Refill  . albuterol (PROVENTIL HFA;VENTOLIN HFA) 108 (90 BASE) MCG/ACT inhaler Inhale 2 puffs into the lungs every 6 (six) hours as needed for wheezing.  1 Inhaler  1  . Prenatal Vit-Fe Fumarate-FA (PRENATAL MULTIVITAMIN) TABS Take 1 tablet by mouth daily.        No current  facility-administered medications on file prior to visit.    Review of Systems:  As per HPI- otherwise negative.   Physical Examination: Filed Vitals:   04/30/14 1227  BP: 128/80  Pulse: 78  Temp: 97.9 F (36.6 C)  Resp: 16   Filed Vitals:   04/30/14 1227  Height: 5\' 6"  (1.676 m)  Weight: 230 lb 3.2 oz (104.418 kg)   Body mass index is 37.17 kg/(m^2). Ideal Body Weight: Weight in (lb) to have BMI = 25: 154.6  GEN: WDWN, NAD, Non-toxic, A & O x 3, looks well HEENT: Atraumatic, Normocephalic. Neck supple. No masses, No LAD. Ears and Nose: No external deformity. CV: RRR, No M/G/R. No JVD. No thrill. No extra heart sounds. PULM: CTA B, no wheezes, crackles, rhonchi. No retractions. No resp. distress. No accessory muscle use. EXTR: No c/c/e NEURO Normal gait.  PSYCH: Normally interactive. Conversant. Not depressed or anxious appearing.  Calm demeanor.    Assessment and Plan: Screening-pulmonary TB  givnenTB free letter- see TB questionnaire. All negative   Signed Abbe AmsterdamJessica Gitel Beste, MD

## 2014-04-30 NOTE — Progress Notes (Signed)
  Tuberculosis Risk Questionnaire  1. No Were you born outside the [place] in one of the following parts of the world: [place], [place], [place], [place] or [place]?    2. No Have you traveled outside the [place] and lived for more than one month in one of the following parts of the world: [place], [place], [place], [place] or [place]?    3. No Do you have a compromised immune system such as from any of the following conditions:HIV/AIDS, organ or bone marrow transplantation, diabetes, immunosuppressive medicines (e.g. Prednisone, Remicaide), leukemia, lymphoma, cancer of the head or neck, gastrectomy or jejunal bypass, end-stage renal disease (on dialysis), or silicosis?     4. No Have you ever or do you plan on working in: a residential care center, a health care facility, a jail or prison or homeless shelter?    5. No Have you ever: injected illegal drugs, used crack cocaine, lived in a homeless shelter  or been in jail or prison?     6. No Have you ever been exposed to anyone with infectious tuberculosis?    Tuberculosis Symptom Questionnaire  Do you currently have any of the following symptoms?  1. No Unexplained cough lasting more than 3 weeks?   2. No Unexplained fever lasting more than 3 weeks.   3. No Night Sweats (sweating that leaves the bedclothes and sheets wet)     4. No Shortness of Breath   5. No Chest Pain   6. No Unintentional weight loss    7. No Unexplained fatigue (very tired for no reason)       Tuberculosis Risk Questionnaire  1. Were you born outside the [place] in one of the following parts of the world:    [place], [place], [place], [place] or [place]?  No  2. Have you traveled outside the [place] and lived for more than one month in one of the following parts of the world:  [place], [place], [place], [place] or [place]?  No  3. Do you have a compromised immune system such as from any of  the following conditions:  HIV/AIDS, organ or bone marrow transplantation, diabetes, immunosuppressive   medicines (e.g. Prednisone, Remicaide), leukemia, lymphoma, cancer of the   head or neck, gastrectomy or jejunal bypass, end-stage renal disease (on   dialysis), or silicosis?  No    4. Have you ever done one of the following:    Used crack cocaine, injected illegal drugs, worked or resided in jail or prison,   worked or resided at a homeless shelter, or worked as a Research scientist (physical sciences)healthcare worker in   direct contact with patients?  No  5. Have you ever been exposed to anyone with infectious tuberculosis?  No

## 2014-05-23 ENCOUNTER — Emergency Department (INDEPENDENT_AMBULATORY_CARE_PROVIDER_SITE_OTHER)
Admission: EM | Admit: 2014-05-23 | Discharge: 2014-05-23 | Disposition: A | Discharge status: Home / Self Care | Payer: 59 | Source: Home / Self Care

## 2014-05-23 ENCOUNTER — Encounter (HOSPITAL_COMMUNITY): Payer: Self-pay | Admitting: Emergency Medicine

## 2014-05-23 DIAGNOSIS — H6502 Acute serous otitis media, left ear: Secondary | ICD-10-CM

## 2014-05-23 DIAGNOSIS — H6500 Acute serous otitis media, unspecified ear: Secondary | ICD-10-CM

## 2014-05-23 DIAGNOSIS — J309 Allergic rhinitis, unspecified: Secondary | ICD-10-CM

## 2014-05-23 HISTORY — DX: Other seasonal allergic rhinitis: J30.2

## 2014-05-23 MED ORDER — FLUTICASONE PROPIONATE 50 MCG/ACT NA SUSP: 2.0000 | Freq: Every day | NASAL | Status: DC

## 2014-05-23 MED ORDER — CETIRIZINE-PSEUDOEPHEDRINE ER 5-120 MG PO TB12: 1.0000 | ORAL_TABLET | Freq: Every day | ORAL | Status: DC

## 2014-05-23 NOTE — ED Notes (Signed)
C/o not being able to hear out of L ear onset 7/2.  It hurts if she burps or tries to massage it.  Has had a cold  For over 1 week with sinus drainage and occasional dry cough.  No fever noted.

## 2014-05-23 NOTE — ED Provider Notes (Signed)
CSN: [id number]     Arrival date & time 05/23/14  1640 History   None    Chief Complaint  Patient presents with  . Ear Fullness   (Consider location/radiation/quality/duration/timing/severity/associated sxs/prior Treatment)  HPI  The patient is a 23 year old female presenting today with complaints of a "stuffed left ear for the past couple of days". Patient states she has seasonal allergies from which she regularly suffers. Patient states she doesn't take allergy meds often as she has a small infant daughter and is concerned that the medications we'll sedate her enough that she will not be able to hear her daughter during the middle of the night. Patient denies any fever, chills, diaphoresis, or malaise over the past several weeks. In addition, the patient denies sore throat, shortness of breath, cough, or chest congestion.  The patient does state she has a history of asthma for which he takes albuterol inhaler PRN. Patient states she's not had to use her inhaler for approximately 6 months.  Past Medical History  Diagnosis Date  . Asthma   . Trichomonas   . Arthritis   . Pregnancy induced hypertension     Pre-E  . Seasonal allergies    Past Surgical History  Procedure Laterality Date  . No past surgeries     Family History  Problem Relation Age of Onset  . Arthritis Mother   . Diabetes Maternal Grandmother   . Arthritis Maternal Grandmother    History  Substance Use Topics  . Smoking status: Never Smoker   . Smokeless tobacco: Never Used  . Alcohol Use: No   OB History   Grav Para Term Preterm Abortions TAB SAB Ect Mult Living   1 1 1       1      Review of Systems  Constitutional: Negative.  Negative for fever, chills, diaphoresis and fatigue.  HENT: Positive for congestion, postnasal drip, rhinorrhea and sneezing. Negative for ear discharge, ear pain, facial swelling, sinus pressure, sore throat, tinnitus, trouble swallowing and voice change.   Eyes: Negative.    Respiratory: Negative.  Negative for cough, chest tightness, shortness of breath and wheezing.   Cardiovascular: Negative.  Negative for chest pain.  Gastrointestinal: Negative.  Negative for nausea, vomiting and diarrhea.  Endocrine: Negative.   Genitourinary: Negative.   Musculoskeletal: Negative.  Negative for myalgias.  Skin: Negative.  Negative for rash.  Allergic/Immunologic: Positive for environmental allergies. Negative for food allergies and immunocompromised state.  Neurological: Negative for dizziness and headaches.  Hematological: Negative.   Psychiatric/Behavioral: Negative.     Allergies  Review of patient's allergies indicates no known allergies.  Home Medications   Prior to Admission medications   Medication Sig Start Date End Date Taking? Authorizing Provider  albuterol (PROVENTIL HFA;VENTOLIN HFA) 108 (90 BASE) MCG/ACT inhaler Inhale 1 puff into the lungs every 6 (six) hours as needed for wheezing or shortness of breath.    Historical Provider, MD  cetirizine-pseudoephedrine (ZYRTEC-D) 5-120 MG per tablet Take 1 tablet by mouth daily. 05/23/14   Weber Cooksatherine Tyniya Kuyper, NP  fluticasone (FLONASE) 50 MCG/ACT nasal spray Place 2 sprays into both nostrils daily. 05/23/14   Weber Cooksatherine Jaceon Heiberger, NP  Prenatal Vit-Fe Fumarate-FA (PRENATAL MULTIVITAMIN) TABS Take 1 tablet by mouth daily.     Historical Provider, MD   BP 136/84  Pulse 100  Temp(Src) 99.1 F (37.3 C) (Oral)  Resp 16  SpO2 100%  LMP 05/11/2014  Breastfeeding? No   Physical Exam  Nursing note and vitals reviewed. Constitutional: She appears  well-developed and well-nourished. No distress.  HENT:  Head: Normocephalic and atraumatic.  Right Ear: External ear normal.  Left Ear: External ear normal.  Mouth/Throat: Oropharynx is clear and moist. No oropharyngeal exudate.  Bilateral tympanic membranes pearly gray in appearance with light reflexes present but distorted bilaterally.  Bony prominences visualized. Bilateral  nares patent. Nasal turbinates are swollen and boggy in appearance. Mild erythema noted in posterior oropharynx,  no evidence of exudate or patches.   Eyes: Pupils are equal, round, and reactive to light. Right eye exhibits no discharge. Left eye exhibits no discharge. No scleral icterus.  Neck: Normal range of motion. Neck supple. No tracheal deviation present.  Mild anterior cervical lymphadenopathy present bilaterally,   Cardiovascular: Normal rate, regular rhythm, normal heart sounds and intact distal pulses.  Exam reveals no gallop and no friction rub.   No murmur heard. Pulmonary/Chest: Effort normal and breath sounds normal. No stridor. No respiratory distress. She has no wheezes. She has no rales. She exhibits no tenderness.  Lymphadenopathy:    She has cervical adenopathy.  Skin: Skin is warm and dry. No rash noted. She is not diaphoretic. No erythema. No pallor.    ED Course  Procedures (including critical care time) Labs Review Labs Reviewed - No data to display  Imaging Review No results found.   MDM   1. Allergic rhinitis, unspecified allergic rhinitis type   2. Acute serous otitis media of left ear, recurrence not specified    Meds ordered this encounter  Medications  . albuterol (PROVENTIL HFA;VENTOLIN HFA) 108 (90 BASE) MCG/ACT inhaler    Sig: Inhale 1 puff into the lungs every 6 (six) hours as needed for wheezing or shortness of breath.  . cetirizine-pseudoephedrine (ZYRTEC-D) 5-120 MG per tablet    Sig: Take 1 tablet by mouth daily.    Dispense:  15 tablet    Refill:  0  . fluticasone (FLONASE) 50 MCG/ACT nasal spray    Sig: Place 2 sprays into both nostrils daily.    Dispense:  16 g    Refill:  2   Discussed the importance of proactive allergy management.  In addition, the patient advised to use saline nasal irrigation to help alleviate symptoms without medication. The patient verbalizes understanding and agrees to plan of care.       Weber Cooksatherine Yousif Edelson,  NP 05/23/14 1916

## 2014-05-23 NOTE — Discharge Instructions (Signed)
Nasal Saline Irrigation  A neti pot is a container designed to rinse debris or mucus from your nasal cavity. You might use a neti pot to treat symptoms of nasal allergies, sinus problems or colds. If you choose to make your own saltwater solution, it's important to use bottled water that has been distilled or sterilized. Tap water is acceptable if it's been boiled for several minutes and then left to cool until it is lukewarm. To use the neti pot, tilt your head sideways over the sink and place the spout of the neti pot in the upper nostril. Breathing through your open mouth, gently pour the saltwater solution into your upper nostril so that the liquid drains through the lower nostril. Repeat on the other side. Be sure to rinse the irrigation device after each use with similarly distilled, sterile, previously boiled and cooled, or filtered water and leave open to air dry. Neti pots are often available in pharmacies and health food stores, as well online.   Allergic Rhinitis Allergic rhinitis is when the mucous membranes in the nose respond to allergens. Allergens are particles in the air that cause your body to have an allergic reaction. This causes you to release allergic antibodies. Through a chain of events, these eventually cause you to release histamine into the blood stream. Although meant to protect the body, it is this release of histamine that causes your discomfort, such as frequent sneezing, congestion, and an itchy, runny nose.  CAUSES  Seasonal allergic rhinitis (hay fever) is caused by pollen allergens that may come from grasses, trees, and weeds. Year-round allergic rhinitis (perennial allergic rhinitis) is caused by allergens such as house dust mites, pet dander, and mold spores.  SYMPTOMS   Nasal stuffiness (congestion).  Itchy, runny nose with sneezing and tearing of the eyes. DIAGNOSIS  Your health care provider can help you determine the allergen or allergens that trigger your  symptoms. If you and your health care provider are unable to determine the allergen, skin or blood testing may be used. TREATMENT  Allergic rhinitis does not have a cure, but it can be controlled by:  Medicines and allergy shots (immunotherapy).  Avoiding the allergen. Hay fever may often be treated with antihistamines in pill or nasal spray forms. Antihistamines block the effects of histamine. There are over-the-counter medicines that may help with nasal congestion and swelling around the eyes. Check with your health care provider before taking or giving this medicine.  If avoiding the allergen or the medicine prescribed do not work, there are many new medicines your health care provider can prescribe. Stronger medicine may be used if initial measures are ineffective. Desensitizing injections can be used if medicine and avoidance does not work. Desensitization is when a patient is given ongoing shots until the body becomes less sensitive to the allergen. Make sure you follow up with your health care provider if problems continue. HOME CARE INSTRUCTIONS It is not possible to completely avoid allergens, but you can reduce your symptoms by taking steps to limit your exposure to them. It helps to know exactly what you are allergic to so that you can avoid your specific triggers. SEEK MEDICAL CARE IF:   You have a fever.  You develop a cough that does not stop easily (persistent).  You have shortness of breath.  You start wheezing.  Symptoms interfere with normal daily activities. Document Released: 07/30/2001 Document Revised: 11/09/2013 Document Reviewed: 07/12/2013 [facility] Patient Information 2015 [employer] [place]. This information is not intended to  replace advice given to you by your health care provider. Make sure you discuss any questions you have with your health care provider. ° °

## 2014-05-24 NOTE — ED Provider Notes (Signed)
Medical screening examination/treatment/procedure(s) were performed by non-physician practitioner and as supervising physician I was immediately available for consultation/collaboration.  Shaila Gilchrest, M.D.  Cylis Ayars C Galileo Colello, MD 05/24/14 2021 

## 2014-09-19 ENCOUNTER — Encounter (HOSPITAL_COMMUNITY): Payer: Self-pay | Admitting: Emergency Medicine

## 2015-04-25 IMAGING — US US OB DETAIL+14 WK
2 series · 12 of 27 positions shown · non-contrast
Comparison: none

[Series 1: us ob detail +14 wk · 10 of 22 slices shown (1 of 2)]
[im 2/22]
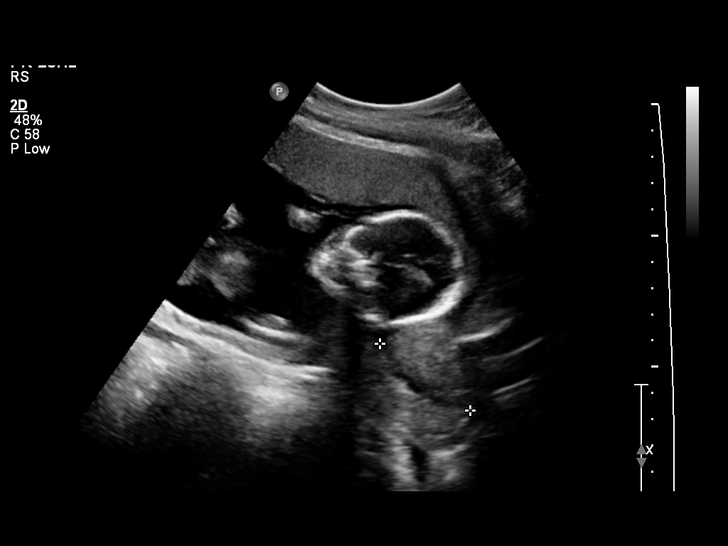
[im 4/22]
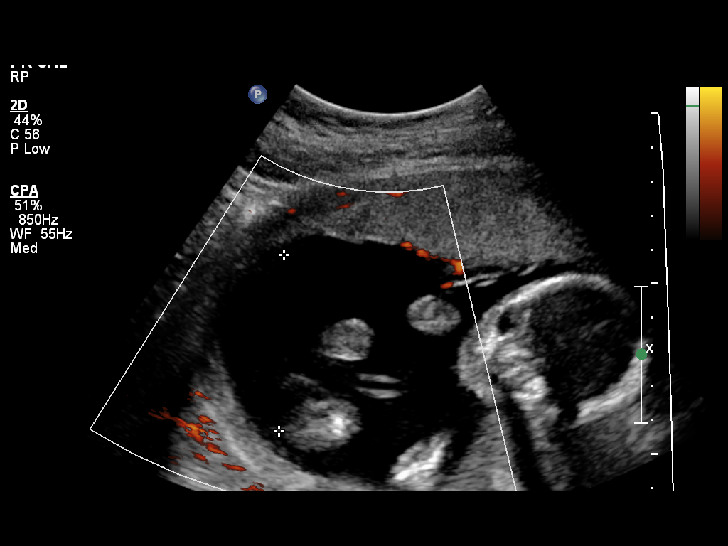
[im 6/22]
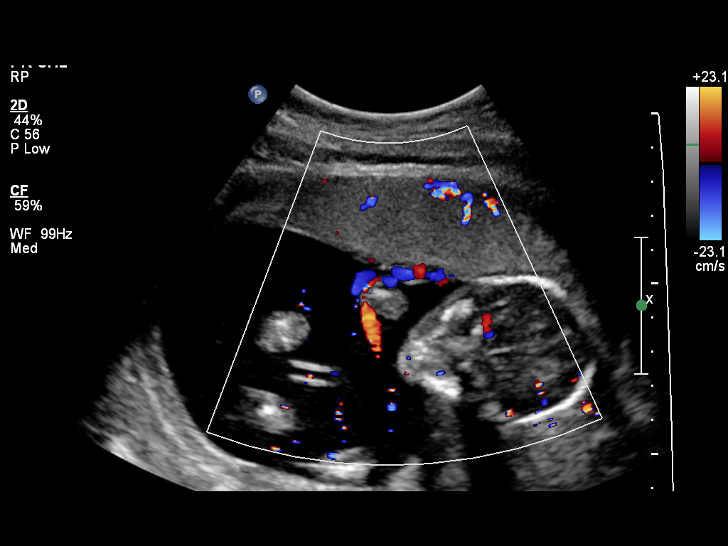
[im 8/22]
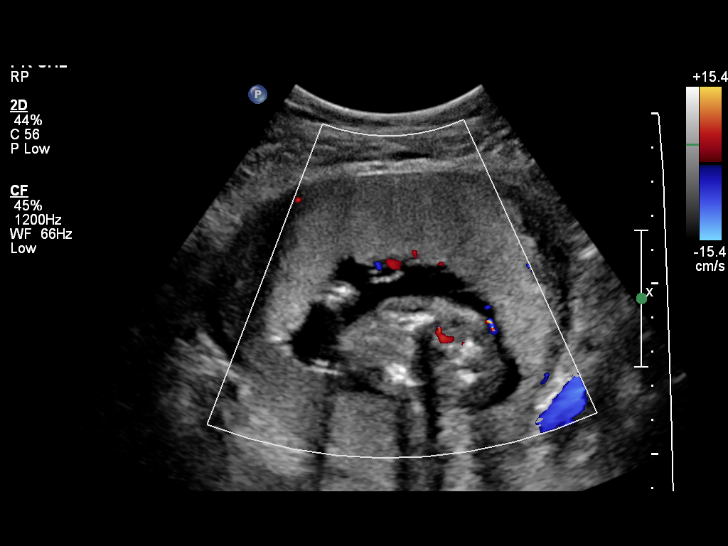
[im 11/22]
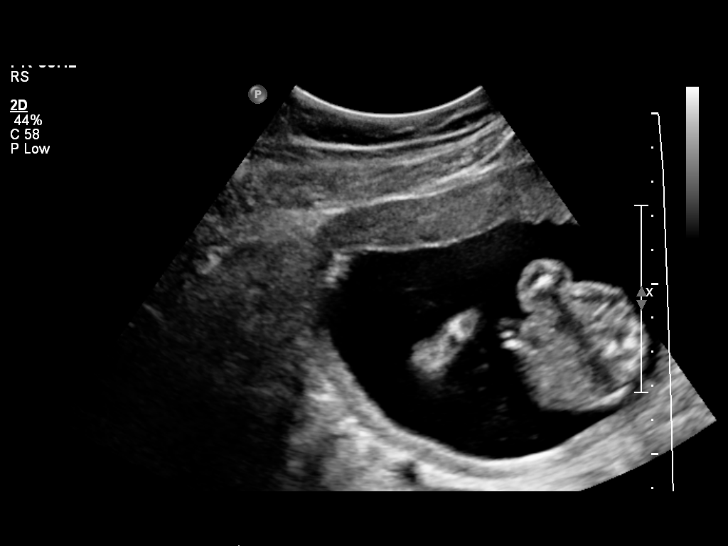
[im 13/22]
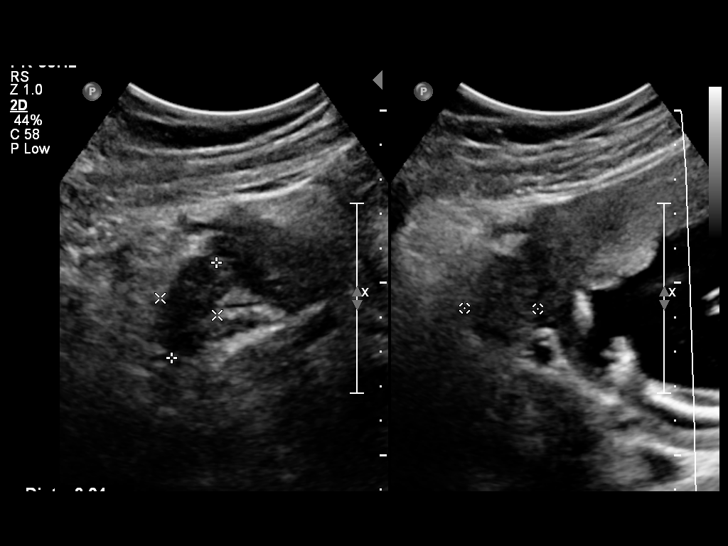
[im 15/22]
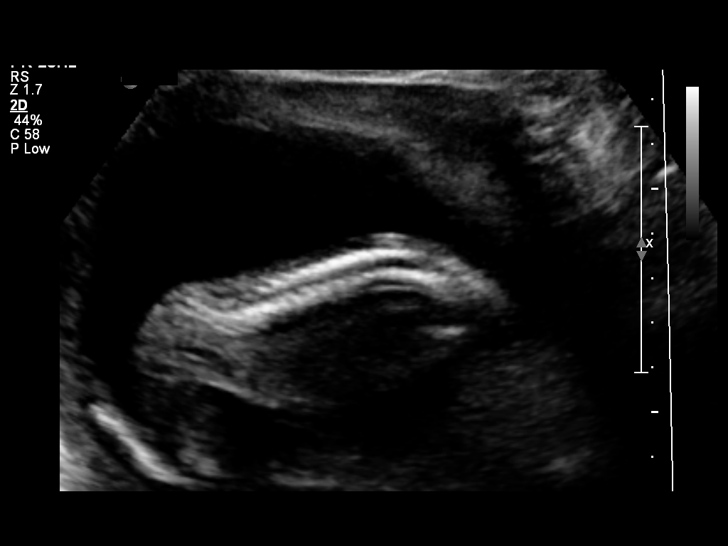
[im 17/22]
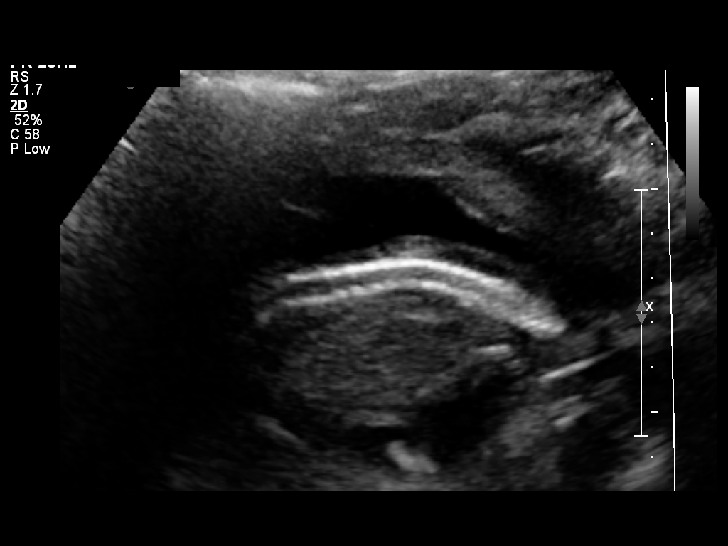
[im 20/22]
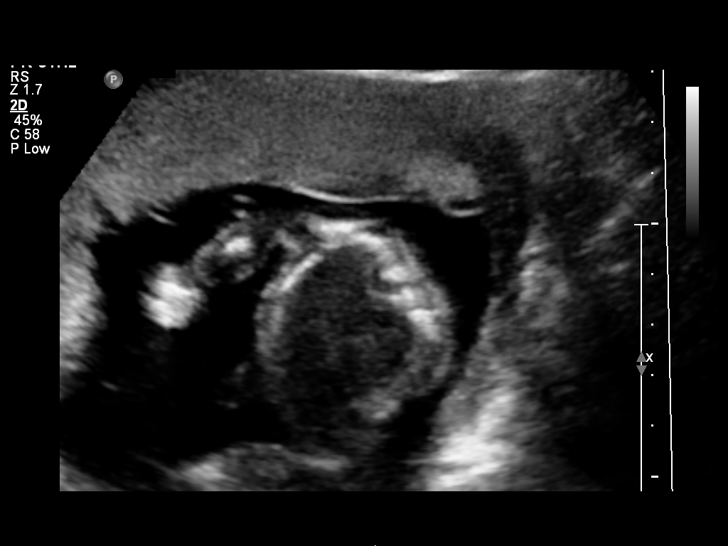
[im 22/22]
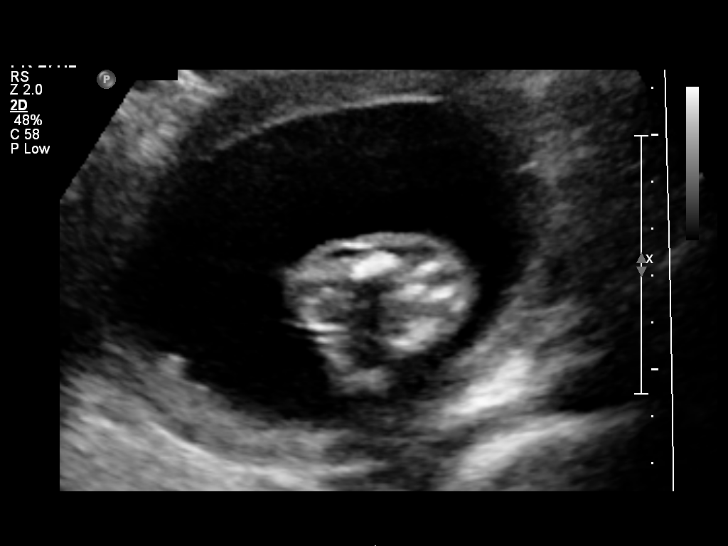

[Series 1: us ob detail +14 wk · 2 of 5 slices shown (2 of 2)]
[im 2/5]
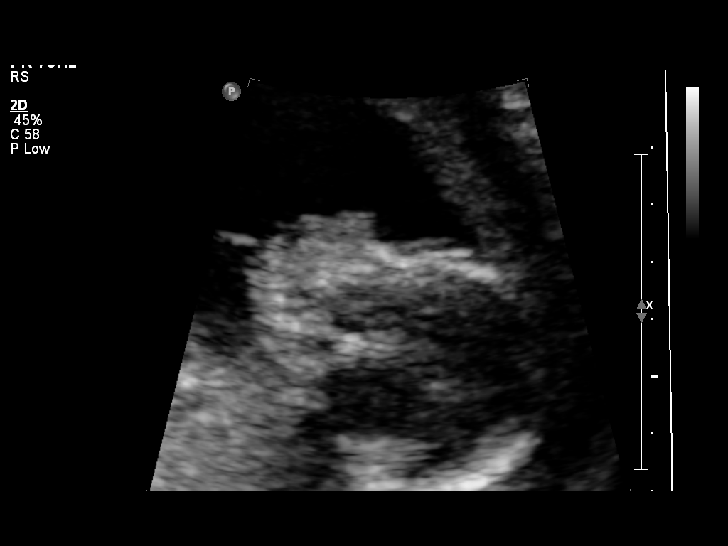
[im 4/5]
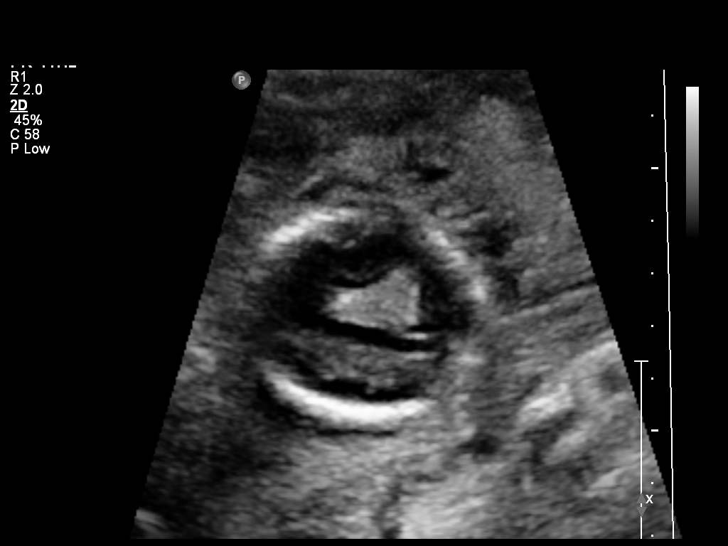

[12 of 27 positions shown; findings below may reference images not displayed]

OBSTETRICS REPORT
                      (Signed Final 06/15/2013 [DATE])

Service(s) Provided

 US OB DETAIL + 14 WK                                  76811.0
Indications

 Detailed fetal anatomic survey
Fetal Evaluation

 Num Of Fetuses:    1
 Fetal Heart Rate:  169                         bpm
 Cardiac Activity:  Observed
 Presentation:      Cephalic
 Placenta:          Anterior, above cervical os
 P. Cord            Visualized, central
 Insertion:

 Amniotic Fluid
 AFI FV:      Subjectively within normal limits
                                             Larg Pckt:   5.15   cm
Biometry

 BPD:     39.4  mm    G. Age:   18w 0d                CI:        69.78   70 - 86
                                                      FL/HC:      17.3   15.8 -
                                                                         18
 HC:     150.5  mm    G. Age:   18w 1d       48  %    HC/AC:      1.23   1.07 -

 AC:     122.6  mm    G. Age:   17w 6d       45  %    FL/BPD:
 FL:      26.1  mm    G. Age:   18w 0d       43  %    FL/AC:      21.3   20 - 24
 HUM:     25.5  mm    G. Age:   18w 0d       56  %
 CER:     17.2  mm    G. Age:   17w 1d       32  %
 NFT:     2.92  mm

 Est. FW:     217  gm      0 lb 8 oz     48  %
Gestational Age

 LMP:           18w 0d       Date:   02/09/13                 EDD:   11/16/13
 U/S Today:     18w 0d                                        EDD:   11/16/13
 Best:          18w 0d    Det. By:   LMP  (02/09/13)          EDD:   11/16/13
Anatomy

 Cranium:          Appears normal         Aortic Arch:      Appears normal
 Fetal Cavum:      Appears normal         Ductal Arch:      Appears normal
 Ventricles:       Appears normal         Diaphragm:        Appears normal
 Choroid Plexus:   Appears normal         Stomach:          Appears normal
 Cerebellum:       Appears normal         Abdomen:          Appears normal
 Posterior Fossa:  Appears normal         Abdominal Wall:   Appears nml (cord
                                                            insert, abd wall)
 Nuchal Fold:      Appears normal         Cord Vessels:     Appears normal (3
                                                            vessel cord)
 Face:             Orbits appear          Kidneys:          Appear normal
                   normal
 Lips:             Appears normal         Bladder:          Appears normal
 Heart:            Not well visualized    Spine:            Appears normal
 RVOT:             Appears normal         Lower             Appears normal
                                          Extremities:
 LVOT:             Appears normal         Upper             Appears normal
                                          Extremities:

 Other:  Fetus appears to be a female. Heels and 5th digit visualized.
Targeted Anatomy

 Fetal Central Nervous System
 Cisterna Magna:
Cervix Uterus Adnexa

 Cervical Length:   4.26      cm

 Cervix:       Normal appearance by transabdominal scan.
 Uterus:       No abnormality visualized.
 Cul De Sac:   No free fluid seen.
 Left Ovary:   Not visualized.
 Right Ovary:  Within normal limits.
 Adnexa:     No abnormality visualized.
Impression

 Single living intrauterine pregnancy in cephalic presentation.
 The estimated gestational age is 18w 0d based on LMP
 (02/09/13).
 No fetal anomalies are identified.
 Heart anatomy is not well evaluated. Recommend follow-up.

 questions or concerns.

## 2015-05-24 IMAGING — US US OB FOLLOW-UP
1 series · 12 of 28 positions shown · non-contrast
Comparison: none

[Series 1: us ob follow up · 12 of 65 slices shown]
[im 3/65]
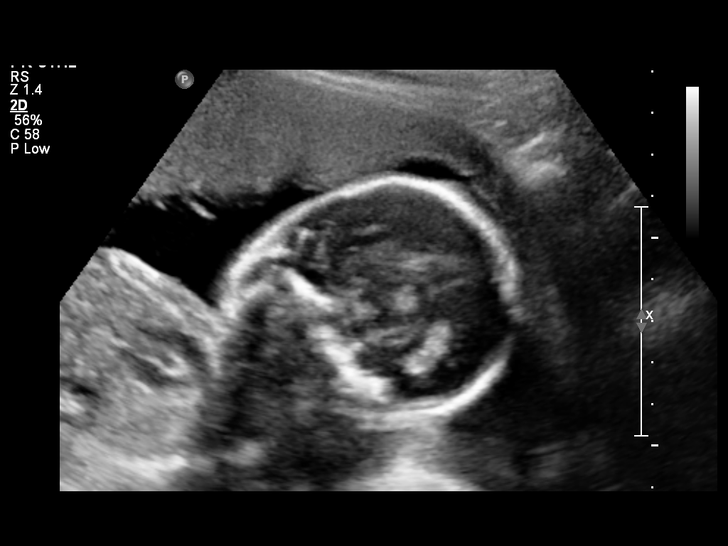
[im 8/65]
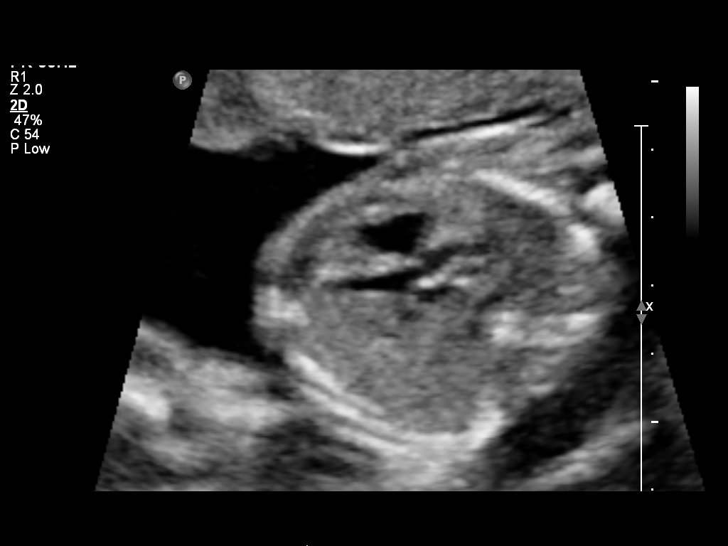
[im 12/65]
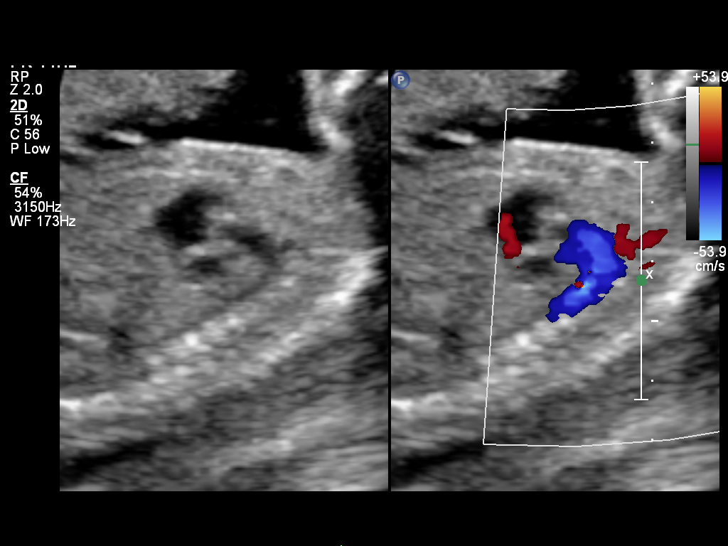
[im 19/65]
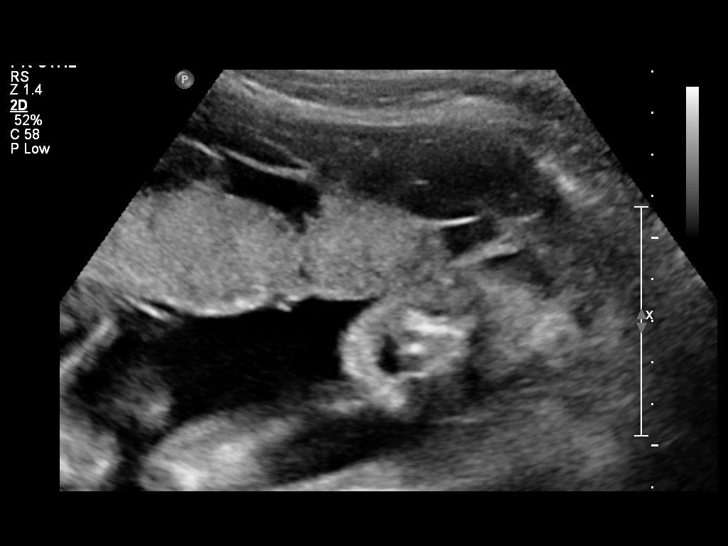
[im 24/65]
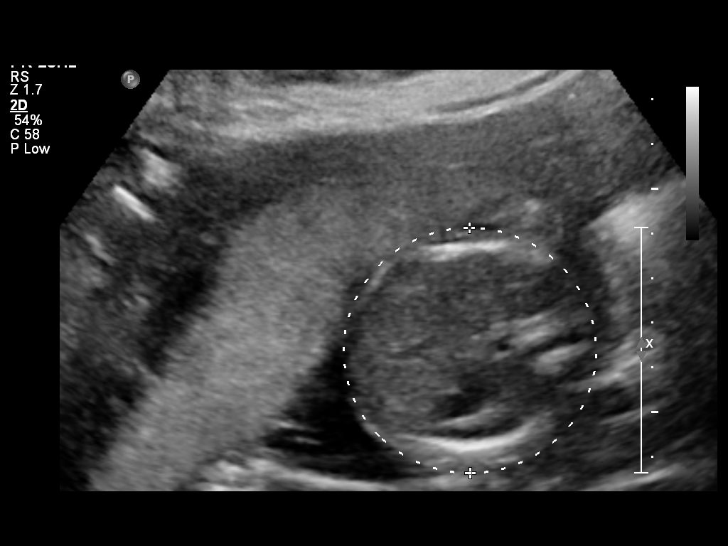
[im 29/65]
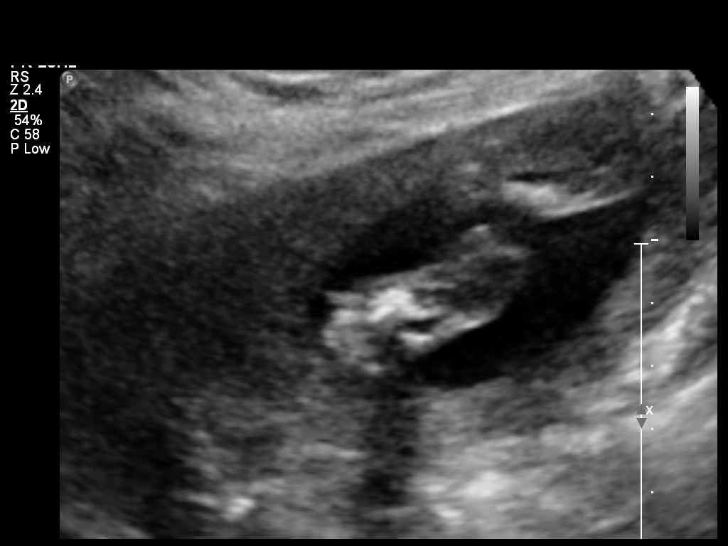
[im 36/65]
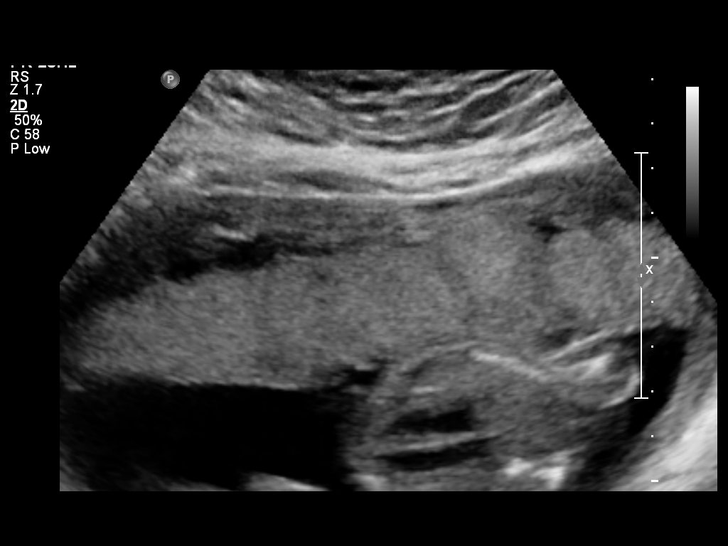
[im 41/65]
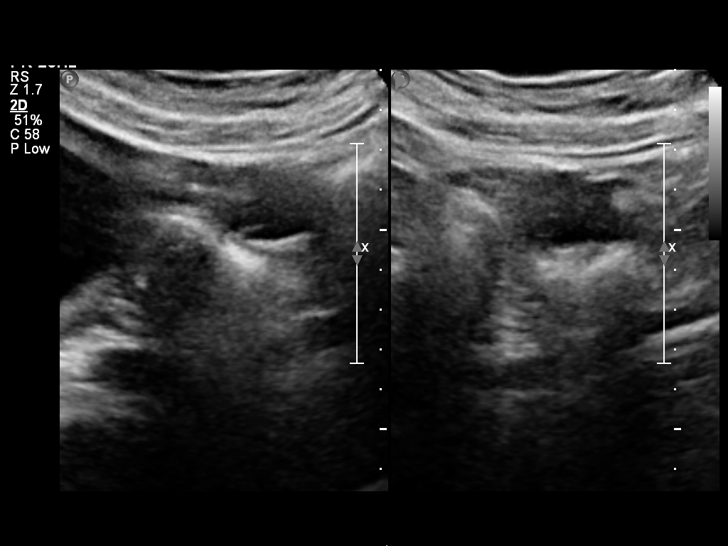
[im 46/65]
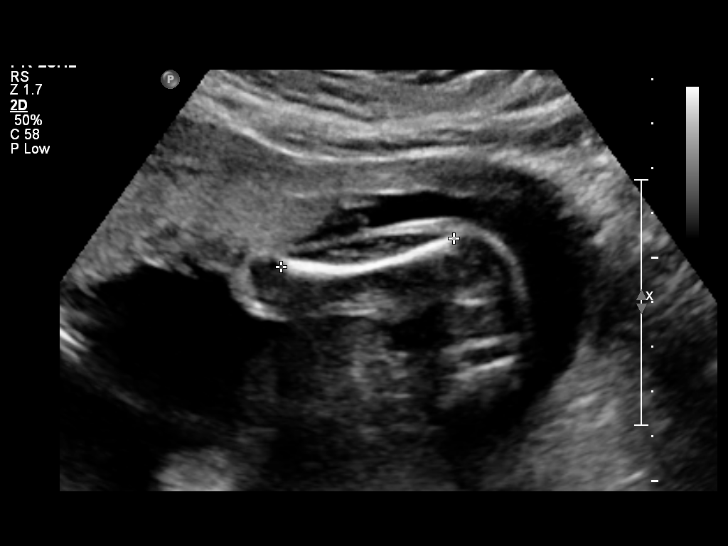
[im 53/65]
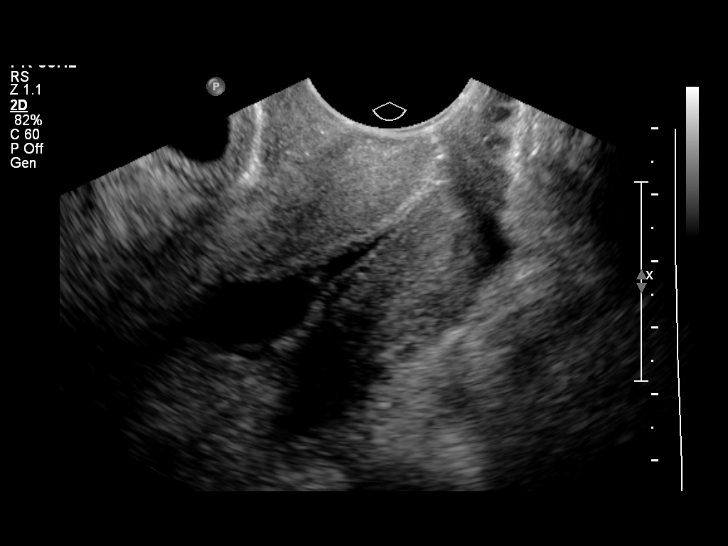
[im 57/65]
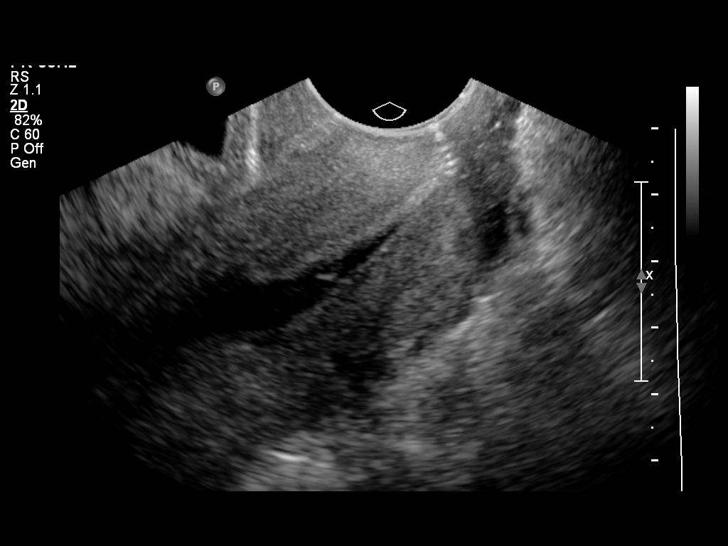
[im 62/65]
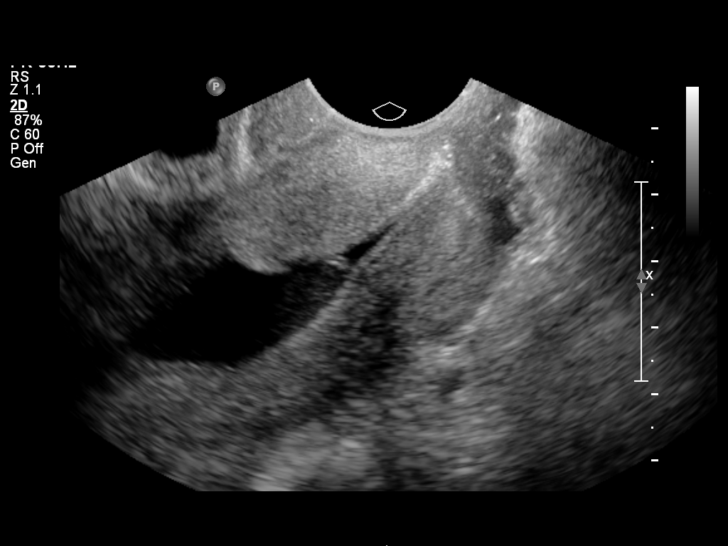

[12 of 28 positions shown; findings below may reference images not displayed]

OBSTETRICS REPORT
                      (Signed Final 07/14/2013 [DATE])

Service(s) Provided

 US OB FOLLOW UP                                       76816.1
 US OB TRANSVAGINAL                                    76817.0
Indications

 Follow-up incomplete fetal anatomic evaluation
Fetal Evaluation

 Num Of Fetuses:    1
 Fetal Heart Rate:  146                         bpm
 Cardiac Activity:  Observed
 Presentation:      Cephalic
 Placenta:          Anterior, above cervical os
 P. Cord            Previously Visualized
 Insertion:

 Amniotic Fluid
 AFI FV:      Subjectively within normal limits
                                             Larg Pckt:     6.2  cm
Biometry

 BPD:     54.7  mm    G. Age:   22w 5d                CI:        72.34   70 - 86
                                                      FL/HC:      18.3   18.4 -

 HC:     204.6  mm    G. Age:   22w 4d       56  %    HC/AC:      1.19   1.06 -

 AC:     172.3  mm    G. Age:   22w 1d       44  %    FL/BPD:     68.6   71 - 87
 FL:      37.5  mm    G. Age:   22w 0d       34  %    FL/AC:      21.8   20 - 24

 Est. FW:     480  gm      1 lb 1 oz     48  %
Gestational Age

 LMP:           22w 1d       Date:   02/09/13                 EDD:   11/16/13
 U/S Today:     22w 2d                                        EDD:   11/15/13
 Best:          22w 1d    Det. By:   LMP  (02/09/13)          EDD:   11/16/13
Anatomy

 Cranium:          Appears normal         Aortic Arch:      Appears normal
 Fetal Cavum:      Previously seen        Ductal Arch:      Appears normal
 Ventricles:       Appears normal         Diaphragm:        Previously seen
 Choroid Plexus:   Previously seen        Stomach:          Appears normal
 Cerebellum:       Previously seen        Abdomen:          Appears normal
 Posterior Fossa:  Previously seen        Abdominal Wall:   Previously seen
 Nuchal Fold:      Previously seen        Cord Vessels:     Previously seen
 Face:             Orbits previously      Kidneys:          Appear normal
                   seen
 Lips:             Previously seen        Bladder:          Appears normal
 Heart:            Appears normal         Spine:            Previously seen
                   (4CH, axis, and
                   situs)
 RVOT:             Appears normal         Lower             Previously seen
                                          Extremities:
 LVOT:             Appears normal         Upper             Previously seen
                                          Extremities:

 Other:  Heels and 5th digit previously visualized.
Cervix Uterus Adnexa

 Cervical Length:   1         cm       Funnel Width:   0.8       cm
 Funnel Length:     2.1       cm

 Cervix:       Funneling of internal os noted. Measured
               transvaginally.
 Uterus:       No abnormality visualized.
 Cul De Sac:   No free fluid seen.

 Left Ovary:   Within normal limits.
 Right Ovary:  Not visualized.
 Adnexa:     No abnormality visualized.
Impression

 Assigned GA is currently 22w 1d.   Appropriate interval fetal
 growth.
 No fetal anomalies seen involving visualized anatomy.
 Normal amniotic fluid volume.
 Cervical funneling and shortening, with closed length of
 1.0cm.

 questions or concerns.

## 2015-08-09 ENCOUNTER — Ambulatory Visit (INDEPENDENT_AMBULATORY_CARE_PROVIDER_SITE_OTHER): Payer: 59 | Admitting: Family Medicine

## 2015-08-09 VITALS — BP 116/74 | HR 90 | Temp 98.0°F | Resp 18 | Ht 66.0 in | Wt 220.0 lb

## 2015-08-09 DIAGNOSIS — R21 Rash and other nonspecific skin eruption: Secondary | ICD-10-CM

## 2015-08-09 DIAGNOSIS — L309 Dermatitis, unspecified: Secondary | ICD-10-CM | POA: Diagnosis not present

## 2015-08-09 DIAGNOSIS — Z0000 Encounter for general adult medical examination without abnormal findings: Secondary | ICD-10-CM

## 2015-08-09 DIAGNOSIS — J4520 Mild intermittent asthma, uncomplicated: Secondary | ICD-10-CM | POA: Diagnosis not present

## 2015-08-09 LAB — POCT SKIN KOH: Skin KOH, POC: NEGATIVE

## 2015-08-09 MED ORDER — TRIAMCINOLONE ACETONIDE 0.1 % EX CREA: 1.0000 "application " | TOPICAL_CREAM | Freq: Two times a day (BID) | CUTANEOUS | Status: DC

## 2015-08-09 MED ORDER — ALBUTEROL SULFATE HFA 108 (90 BASE) MCG/ACT IN AERS: 1.0000 | INHALATION_SPRAY | Freq: Four times a day (QID) | RESPIRATORY_TRACT | Status: DC | PRN

## 2015-08-09 NOTE — Progress Notes (Signed)
Physical examination  History: Patient is here for physical examination and a form for her job. No major concerns about the job. She does have history of allergies and has been having some rash. See the separate note regarding those.  Past medical history: Medications: Intermittently uses a Proventil inhaler Allergies: None known Medical illnesses she does have respiratory allergies and probably skin allergies Surgeries: None Gravida 1 para 1  Family history: Mother has diabetes. Father is currently living and well. No major familial diseases   Social history: She does not smoke, drink or use any drugs. She is married, her husband is her only sexual partner. She is an Programmer, systems. She has a college degree from [employer].  Review of systems: Constitutional: Doesn't eat as much she used to but her white staying stable. HEENT: Mild hoarseness intermittently Respiratory: Does get short of breath a little bit at times, allergy related Cardiovascular: Unremarkable Gastrointestinal: Unremarkable Endocrine: Unremarkable Genitourinary: Unremarkable Muscular skeletal: Unremarkable Dermatologic rash as delineated in a separate note Allergy/immunology seasonal allergies Neurological: Unremarkable Hematologic: Unremarkable Psychiatric: Unremarkable   Preventive history: She has had her Pap test done in July. She also had a mammogram which is normal at that time. She has not had an EKG, bone density or colonoscopy never  Physical examination: Overweight lady in no acute distress. TMs normal. Eyes PERRLA. Fundi benign. Throat clear. Neck supple without nodes or thyromegaly. Chest clear to auscultation. Heart regular without murmurs gallops or arrhythmias. Abdomen soft without mass or tenderness. Extremities unremarkable. Spine normal. She does have the rash as delineated in a separate note.  Assessment: Annual physical examination  Plan: Forms completed Advised to work on  regular exercise and weight loss with a family history of diabetes.

## 2015-08-09 NOTE — Progress Notes (Signed)
Patient ID: Denise [name], female    DOB: July 15, 1991  Age: 24 y.o. MRN: [id number]  Chief Complaint  Patient presents with  . Annual Exam    has form   . Rash    eczema  . Medication Refill    inhaler    Subjective:   Patient has been having problems with her allergies. She has needed a new prescription on her albuterol for her inhaler which she uses a couple times a month. She does not smoke.  She has been M problems with a rash scattered around her body. She has some on her buttocks and medial thighs. Some on her hands and arms and a tattoo on her right upper arm. These places come up as little superficial blisters. They itched badly.  Current allergies, medications, problem list, past/family and social histories reviewed.  Objective:  BP 116/74 mmHg  Pulse 90  Temp(Src) 98 F (36.7 C) (Oral)  Resp 18  Ht  (1.676 m)  Wt 220 lb (99.791 kg)  BMI 35.53 kg/m2  SpO2 97%  LMP 08/08/2015  Scattered rash, peeling and looks like she has had some shallow blisters. Skin scraping done and KOH was negative.  Assessment & Plan:   Assessment: 1. Asthma, chronic, mild intermittent, uncomplicated   2. Rash and nonspecific skin eruption   3. Eczema       Plan: Orders Placed This Encounter  Procedures  . POCT Skin KOH    Meds ordered this encounter  Medications  . albuterol (PROVENTIL HFA;VENTOLIN HFA) 108 (90 BASE) MCG/ACT inhaler    Sig: Inhale 1 puff into the lungs every 6 (six) hours as needed for wheezing or shortness of breath.    Dispense:  1 Inhaler    Refill:  3  . triamcinolone cream (KENALOG) 0.1 %    Sig: Apply 1 application topically 2 (two) times daily.    Dispense:  45 g    Refill:  1     Will treat for the allergies. Due to complete physical today also.    Patient Instructions  Use the triamcinolone cream once or twice daily on areas of rash  Continue using the albuterol as needed  Consider taking an antihistamine such as Zyrtec (cetirizine)  or Allegra (fexofenadine) for the itching  Return if rash continues to persist in which case we might need to do a little skin biopsy.  Otherwise you look healthy and form is completed.  I do advise you to work hard on regular exercise and weight loss due to the family history of diabetes.    No Follow-up on file.   HOPPER,DAVID, MD 08/09/2015

## 2015-08-09 NOTE — Patient Instructions (Signed)
Use the triamcinolone cream once or twice daily on areas of rash  Continue using the albuterol as needed  Consider taking an antihistamine such as Zyrtec (cetirizine) or Allegra (fexofenadine) for the itching  Return if rash continues to persist in which case we might need to do a little skin biopsy.  Otherwise you look healthy and form is completed.  I do advise you to work hard on regular exercise and weight loss due to the family history of diabetes.

## 2015-08-09 NOTE — Progress Notes (Signed)

## 2015-11-19 DIAGNOSIS — Z8619 Personal history of other infectious and parasitic diseases: Secondary | ICD-10-CM

## 2015-11-19 DIAGNOSIS — A599 Trichomoniasis, unspecified: Secondary | ICD-10-CM

## 2015-11-19 HISTORY — DX: Trichomoniasis, unspecified: A59.9

## 2015-11-19 HISTORY — DX: Personal history of other infectious and parasitic diseases: Z86.19

## 2016-04-08 ENCOUNTER — Ambulatory Visit (INDEPENDENT_AMBULATORY_CARE_PROVIDER_SITE_OTHER): Payer: 59 | Admitting: Physician Assistant

## 2016-04-08 VITALS — BP 130/80 | HR 82 | Temp 98.5°F | Resp 16 | Ht 66.0 in | Wt 232.0 lb

## 2016-04-08 DIAGNOSIS — J4520 Mild intermittent asthma, uncomplicated: Secondary | ICD-10-CM | POA: Diagnosis not present

## 2016-04-08 DIAGNOSIS — Z111 Encounter for screening for respiratory tuberculosis: Secondary | ICD-10-CM | POA: Diagnosis not present

## 2016-04-08 MED ORDER — ALBUTEROL SULFATE HFA 108 (90 BASE) MCG/ACT IN AERS: 1.0000 | INHALATION_SPRAY | Freq: Four times a day (QID) | RESPIRATORY_TRACT | Status: DC | PRN

## 2016-04-08 NOTE — Progress Notes (Signed)

## 2016-04-08 NOTE — Patient Instructions (Signed)
     IF you received an x-ray today, you will receive an invoice from [facility]. Please contact [facility] at [phone] with questions or concerns regarding your invoice.   IF you received labwork today, you will receive an invoice from [facility]. Please contact [facility] at [phone] with questions or concerns regarding your invoice.   Our billing staff will not be able to assist you with questions regarding bills from these companies.  You will be contacted with the lab results as soon as they are available. The fastest way to get your results is to activate your My Chart account. Instructions are located on the last page of this paperwork. If you have not heard from us regarding the results in 2 weeks, please contact this office.      

## 2016-04-08 NOTE — Progress Notes (Signed)
   04/08/2016 4:34 PM   DOB: 1991/03/17 / MRN: [id number]  SUBJECTIVE:  Denise [name] is a 25 y.o. female presenting for a medication refill and a tb test. She screened negative for TB in the past however her job requires that she have the TST.    She would like a refill of her Albuterol today.  Reports she needs this about one time per week and only with exercise.  Denies night time cough.    She has No Known Allergies.   She  has a past medical history of Asthma; Trichomonas; Arthritis; Pregnancy induced hypertension; Seasonal allergies; and Allergy.    She  reports that she has never smoked. She has never used smokeless tobacco. She reports that she does not drink alcohol or use illicit drugs. She  reports that she currently engages in sexual activity. She reports using the following method of birth control/protection: IUD. The patient  has past surgical history that includes No past surgeries.  Her family history includes Arthritis in her maternal grandmother and mother; Diabetes in her maternal grandmother.  Review of Systems  Respiratory: Negative for cough, shortness of breath and wheezing.   Cardiovascular: Negative for chest pain.    Problem list and medications reviewed and updated by myself where necessary, and exist elsewhere in the encounter.   OBJECTIVE:  BP 130/80 mmHg  Pulse 82  Temp(Src) 98.5 F (36.9 C) (Oral)  Resp 16  Ht 5\' 6"  (1.676 m)  Wt 232 lb (105.235 kg)  BMI 37.46 kg/m2  SpO2 99%  LMP 02/07/2016 (Approximate)  Physical Exam  Constitutional: She is oriented to person, place, and time. She appears well-developed.  Eyes: EOM are normal. Pupils are equal, round, and reactive to light.  Cardiovascular: Normal rate.   Pulmonary/Chest: Effort normal.  Abdominal: She exhibits no distension.  Musculoskeletal: Normal range of motion.  Neurological: She is alert and oriented to person, place, and time. No cranial nerve deficit.  Skin: Skin is warm and dry.  She is not diaphoretic.  Psychiatric: She has a normal mood and affect.  Vitals reviewed.   No results found for this or any previous visit (from the past 72 hour(s)).  No results found.  ASSESSMENT AND PLAN  Denise [name] was seen today for immunizations and medication refill.  Diagnoses and all orders for this visit:  Asthma, chronic, mild intermittent, uncomplicated: Well managed. Continue current plan.  -     albuterol (PROVENTIL HFA;VENTOLIN HFA) 108 (90 Base) MCG/ACT inhaler; Inhale 1 puff into the lungs every 6 (six) hours as needed for wheezing or shortness of breath.  Screening-pulmonary TB -     TB Skin Test    The patient was advised to call or return to clinic if she does not see an improvement in symptoms or to seek the care of the closest emergency department if she worsens with the above plan.   Deliah BostonMichael Clark, MHS, PA-C [facility] 04/08/2016 4:34 PM

## 2016-04-10 ENCOUNTER — Ambulatory Visit (INDEPENDENT_AMBULATORY_CARE_PROVIDER_SITE_OTHER): Payer: 59

## 2016-04-10 DIAGNOSIS — Z7689 Persons encountering health services in other specified circumstances: Secondary | ICD-10-CM

## 2016-04-10 DIAGNOSIS — Z111 Encounter for screening for respiratory tuberculosis: Secondary | ICD-10-CM

## 2016-04-10 LAB — TB SKIN TEST
INDURATION: 0 mm
TB SKIN TEST: NEGATIVE

## 2016-04-10 NOTE — Progress Notes (Signed)
Pt. Was here for a PPD read. Results negative. Induration 0.00mm. Pt. Was given a copy of her results.  

## 2016-06-19 ENCOUNTER — Emergency Department (HOSPITAL_COMMUNITY): Payer: 59

## 2016-06-19 ENCOUNTER — Encounter (HOSPITAL_COMMUNITY): Payer: Self-pay

## 2016-06-19 ENCOUNTER — Emergency Department (HOSPITAL_COMMUNITY)
Admission: EM | Admit: 2016-06-19 | Discharge: 2016-06-19 | Disposition: A | Discharge status: Home / Self Care | Payer: 59 | Attending: Emergency Medicine | Admitting: Emergency Medicine

## 2016-06-19 DIAGNOSIS — M25562 Pain in left knee: Secondary | ICD-10-CM | POA: Diagnosis present

## 2016-06-19 DIAGNOSIS — M79605 Pain in left leg: Secondary | ICD-10-CM

## 2016-06-19 DIAGNOSIS — J45909 Unspecified asthma, uncomplicated: Secondary | ICD-10-CM | POA: Insufficient documentation

## 2016-06-19 DIAGNOSIS — M79662 Pain in left lower leg: Secondary | ICD-10-CM | POA: Insufficient documentation

## 2016-06-19 LAB — POC URINE PREG, ED: Preg Test, Ur: NEGATIVE

## 2016-06-19 MED ORDER — IBUPROFEN 200 MG PO TABS
600.0000 mg | ORAL_TABLET | Freq: Once | ORAL | Status: AC
  Administered 2016-06-19: 600 mg via ORAL
  Filled 2016-06-19: qty 1

## 2016-06-19 MED ORDER — IBUPROFEN 800 MG PO TABS: 800.0000 mg | ORAL_TABLET | Freq: Three times a day (TID) | ORAL | 0 refills | Status: DC

## 2016-06-19 NOTE — Discharge Instructions (Signed)
Medications: ibuprofen  Treatment: Take ibuprofen 3 times daily as needed for your pain. Use ice 3-4 times daily alternating 20 minutes on, 20 minutes off.  Follow-up: Please follow-up with Dr. Charlann Boxer, an orthopedic doctor, for further evaluation and treatment of your pain if it continues. Please follow-up and establish care with a primary care provider by calling the number circled on your discharge paperwork. Please return to the emergency department if you develop any new or worsening symptoms.

## 2016-06-19 NOTE — ED Triage Notes (Signed)
Pt reports left leg pain below the knee after leaving a room that is filled with compressors. Pt also reported some dizziness PTA which has subsided at this time. Pt ambulatory to triage. She reports pain increases with walking.
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
## 2016-07-12 ENCOUNTER — Telehealth (HOSPITAL_COMMUNITY): Payer: Self-pay | Admitting: *Deleted

## 2016-07-12 ENCOUNTER — Inpatient Hospital Stay (HOSPITAL_COMMUNITY)
Admission: AD | Admit: 2016-07-12 | Discharge: 2016-07-12 | Disposition: A | Discharge status: Home / Self Care | Payer: 59 | Source: Ambulatory Visit | Attending: Obstetrics and Gynecology | Admitting: Obstetrics and Gynecology

## 2016-07-12 ENCOUNTER — Encounter (HOSPITAL_COMMUNITY): Payer: Self-pay | Admitting: *Deleted

## 2016-07-12 DIAGNOSIS — Z79899 Other long term (current) drug therapy: Secondary | ICD-10-CM | POA: Diagnosis not present

## 2016-07-12 DIAGNOSIS — J45909 Unspecified asthma, uncomplicated: Secondary | ICD-10-CM | POA: Insufficient documentation

## 2016-07-12 DIAGNOSIS — A599 Trichomoniasis, unspecified: Secondary | ICD-10-CM | POA: Insufficient documentation

## 2016-07-12 DIAGNOSIS — N939 Abnormal uterine and vaginal bleeding, unspecified: Secondary | ICD-10-CM | POA: Insufficient documentation

## 2016-07-12 DIAGNOSIS — M1990 Unspecified osteoarthritis, unspecified site: Secondary | ICD-10-CM | POA: Diagnosis not present

## 2016-07-12 DIAGNOSIS — A749 Chlamydial infection, unspecified: Secondary | ICD-10-CM

## 2016-07-12 LAB — WET PREP, GENITAL
SPERM: NONE SEEN
WBC WET PREP: NONE SEEN
YEAST WET PREP: NONE SEEN

## 2016-07-12 LAB — CBC
HEMATOCRIT: 36.5 % (ref 36.0–46.0)
Hemoglobin: 12 g/dL (ref 12.0–15.0)
MCH: 24.9 pg — AB (ref 26.0–34.0)
MCHC: 32.9 g/dL (ref 30.0–36.0)
MCV: 75.9 fL — AB (ref 78.0–100.0)
PLATELETS: 275 10*3/uL (ref 150–400)
RBC: 4.81 MIL/uL (ref 3.87–5.11)
RDW: 14.4 % (ref 11.5–15.5)
WBC: 3.4 10*3/uL — AB (ref 4.0–10.5)

## 2016-07-12 LAB — GC/CHLAMYDIA PROBE AMP (~~LOC~~) NOT AT ARMC
Chlamydia: POSITIVE — AB
Neisseria Gonorrhea: NEGATIVE

## 2016-07-12 LAB — RPR: RPR Ser Ql: NONREACTIVE

## 2016-07-12 LAB — HIV ANTIBODY (ROUTINE TESTING W REFLEX): HIV Screen 4th Generation wRfx: NONREACTIVE

## 2016-07-12 MED ORDER — AZITHROMYCIN 500 MG PO TABS: ORAL_TABLET | ORAL | 0 refills | Status: DC

## 2016-07-12 MED ORDER — METRONIDAZOLE 500 MG PO TABS
2000.0000 mg | ORAL_TABLET | Freq: Once | ORAL | Status: AC
  Administered 2016-07-12: 2000 mg via ORAL
  Filled 2016-07-12: qty 4

## 2016-07-12 NOTE — MAU Note (Signed)
Pt presents with complaint of heavy bleeding. States she had her IUD removed on 08/21 and has been bleeding heavily since then.

## 2016-07-12 NOTE — Telephone Encounter (Signed)
Telephone call to patient regarding positive chlamydia culture, patient notified.  Rx routed to pharmacy per protocol.  Instructed patient to notify her partner for treatment and to abstain from sex for seven days post treatment.  Report faxed to health department.  

## 2016-07-12 NOTE — Discharge Instructions (Signed)
Trichomoniasis Trichomoniasis is an infection caused by an organism called Trichomonas. The infection can affect both women and men. In women, the outer female genitalia and the vagina are affected. In men, the penis is mainly affected, but the prostate and other reproductive organs can also be involved. Trichomoniasis is a sexually transmitted infection (STI) and is most often passed to another person through sexual contact.  RISK FACTORS  Having unprotected sexual intercourse.  Having sexual intercourse with an infected partner. SIGNS AND SYMPTOMS  Symptoms of trichomoniasis in women include:  Abnormal gray-green frothy vaginal discharge.  Itching and irritation of the vagina.  Itching and irritation of the area outside the vagina. Symptoms of trichomoniasis in men include:   Penile discharge with or without pain.  Pain during urination. This results from inflammation of the urethra. DIAGNOSIS  Trichomoniasis may be found during a Pap test or physical exam. Your health care provider may use one of the following methods to help diagnose this infection:  Testing the pH of the vagina with a test tape.  Using a vaginal swab test that checks for the Trichomonas organism. A test is available that provides results within a few minutes.  Examining a urine sample.  Testing vaginal secretions. Your health care provider may test you for other STIs, including HIV. TREATMENT   You may be given medicine to fight the infection. Women should inform their health care provider if they could be or are pregnant. Some medicines used to treat the infection should not be taken during pregnancy.  Your health care provider may recommend over-the-counter medicines or creams to decrease itching or irritation.  Your sexual partner will need to be treated if infected.  Your health care provider may test you for infection again 3 months after treatment. HOME CARE INSTRUCTIONS   Take medicines only as  directed by your health care provider.  Take over-the-counter medicine for itching or irritation as directed by your health care provider.  Do not have sexual intercourse while you have the infection.  Women should not douche or wear tampons while they have the infection.  Discuss your infection with your partner. Your partner may have gotten the infection from you, or you may have gotten it from your partner.  Have your sex partner get examined and treated if necessary.  Practice safe, informed, and protected sex.  See your health care provider for other STI testing. SEEK MEDICAL CARE IF:   You still have symptoms after you finish your medicine.  You develop abdominal pain.  You have pain when you urinate.  You have bleeding after sexual intercourse.  You develop a rash.  Your medicine makes you sick or makes you throw up (vomit). MAKE SURE YOU:  Understand these instructions.  Will watch your condition.  Will get help right away if you are not doing well or get worse.   This information is not intended to replace advice given to you by your health care provider. Make sure you discuss any questions you have with your health care provider.   Document Released: 04/30/2001 Document Revised: 11/25/2014 Document Reviewed: 08/16/2013 Elsevier Interactive Patient Education 2016 Elsevier Inc.  Abnormal Uterine Bleeding Abnormal uterine bleeding means bleeding from the vagina that is not your normal menstrual period. This can be:  Bleeding or spotting between periods.  Bleeding after sex (sexual intercourse).  Bleeding that is heavier or more than normal.  Periods that last longer than usual.  Bleeding after menopause. There are many problems that may  cause this. Treatment will depend on the cause of the bleeding. Any kind of bleeding that is not normal should be reviewed by your doctor.  HOME CARE Watch your condition for any changes. These actions may lessen any  discomfort you are having:  Do not use tampons or douches as told by your doctor.  Change your pads often. You should get regular pelvic exams and Pap tests. Keep all appointments for tests as told by your doctor. GET HELP IF:  You are bleeding for more than 1 week.  You feel dizzy at times. GET HELP RIGHT AWAY IF:   You pass out.  You have to change pads every 15 to 30 minutes.  You have belly pain.  You have a fever.  You become sweaty or weak.  You are passing large blood clots from the vagina.  You feel sick to your stomach (nauseous) and throw up (vomit). MAKE SURE YOU:  Understand these instructions.  Will watch your condition.  Will get help right away if you are not doing well or get worse.   This information is not intended to replace advice given to you by your health care provider. Make sure you discuss any questions you have with your health care provider.   Document Released: 09/01/2009 Document Revised: 11/09/2013 Document Reviewed: 06/03/2013 Elsevier Interactive Patient Education [employer] Elsevier Inc.

## 2016-07-12 NOTE — MAU Provider Note (Signed)
History     CSN: [id number]  Arrival date and time: 07/12/16 [id number]   First Provider Initiated Contact with Patient 07/12/16 0256      Chief Complaint  Patient presents with  . Vaginal Bleeding   Vaginal Bleeding  The patient's primary symptoms include vaginal bleeding. This is a new problem. Episode onset: Had IUD removed on 07/08/16. Bleeding started on 07/10/16, and it is heavier today.   The problem occurs constantly. The problem has been gradually worsening. Pain severity now: 6/10  The problem affects the left side. She is not pregnant. Associated symptoms include abdominal pain. Pertinent negatives include no chills, constipation, diarrhea, dysuria, fever, frequency, nausea, urgency or vomiting. The vaginal discharge was bloody. The vaginal bleeding is heavier than menses. She has been passing clots (one clot about the size of a lemon. ). She has not been passing tissue. Nothing aggravates the symptoms. She has tried nothing for the symptoms. She uses nothing for contraception. Her menstrual history has been irregular (07/08/16 ).    Past Medical History:  Diagnosis Date  . Allergy   . Arthritis   . Asthma   . Pregnancy induced hypertension    Pre-E  . Seasonal allergies   . Trichomonas     Past Surgical History:  Procedure Laterality Date  . NO PAST SURGERIES      Family History  Problem Relation Age of Onset  . Arthritis Mother   . Diabetes Maternal Grandmother   . Arthritis Maternal Grandmother     Social History  Substance Use Topics  . Smoking status: Never Smoker  . Smokeless tobacco: Never Used  . Alcohol use No    Allergies: No Known Allergies  Prescriptions Prior to Admission  Medication Sig Dispense Refill Last Dose  . albuterol (PROVENTIL HFA;VENTOLIN HFA) 108 (90 Base) MCG/ACT inhaler Inhale 1 puff into the lungs every 6 (six) hours as needed for wheezing or shortness of breath. 1 Inhaler 3 More than a month at Unknown time  . ibuprofen  (ADVIL,MOTRIN) 800 MG tablet Take 1 tablet (800 mg total) by mouth 3 (three) times daily. 21 tablet 0 Unknown at Unknown time    Review of Systems  Constitutional: Negative for chills and fever.  Gastrointestinal: Positive for abdominal pain. Negative for constipation, diarrhea, nausea and vomiting.  Genitourinary: Positive for vaginal bleeding. Negative for dysuria, frequency and urgency.   Physical Exam   Blood pressure 140/89, pulse 78, temperature 97.6 F (36.4 C), temperature source Oral, resp. rate 20, last menstrual period 07/08/2016, SpO2 100 %.  Physical Exam  Nursing note and vitals reviewed. Constitutional: She is oriented to person, place, and time. She appears well-developed and well-nourished. No distress.  HENT:  Head: Normocephalic.  Cardiovascular: Normal rate.   Respiratory: Effort normal.  GI: Soft. There is no tenderness. There is no rebound.  Genitourinary:  Genitourinary Comments:  External: no lesion Vagina: small amount of blood seen  Cervix: pink, smooth, no CMT Uterus: NSSC Adnexa: NT   Neurological: She is alert and oriented to person, place, and time.  Skin: Skin is warm and dry.  Psychiatric: She has a normal mood and affect.   Results for orders placed or performed during the hospital encounter of 07/12/16 (from the past 24 hour(s))  Wet prep, genital     Status: Abnormal   Collection Time: 07/12/16  3:05 AM  Result Value Ref Range   Yeast Wet Prep HPF POC NONE SEEN NONE SEEN   Trich, Wet Prep PRESENT (A)  NONE SEEN   Clue Cells Wet Prep HPF POC PRESENT (A) NONE SEEN   WBC, Wet Prep HPF POC NONE SEEN NONE SEEN   Sperm NONE SEEN   CBC     Status: Abnormal   Collection Time: 07/12/16  3:18 AM  Result Value Ref Range   WBC 3.4 (L) 4.0 - 10.5 K/uL   RBC 4.81 3.87 - 5.11 MIL/uL   Hemoglobin 12.0 12.0 - 15.0 g/dL   HCT 16.1 09.6 - 04.5 %   MCV 75.9 (L) 78.0 - 100.0 fL   MCH 24.9 (L) 26.0 - 34.0 pg   MCHC 32.9 30.0 - 36.0 g/dL   RDW 40.9 81.1  - 91.4 %   Platelets 275 150 - 400 K/uL    MAU Course  Procedures  MDM Treated with 2g flagyl here in MAU   Assessment and Plan   1. Abnormal uterine bleeding   2. Trichomoniasis    DC home Comfort measures reviewed  Bleeding precautions RX: none  Return to MAU as needed  Follow-up Information    Almon Hercules., MD .   Specialty:  Obstetrics and Gynecology Contact information: [address] [place] [address] [phone]            Tawnya Crook 07/12/2016, 2:57 AM

## 2017-12-28 ENCOUNTER — Emergency Department (HOSPITAL_COMMUNITY)
Admission: EM | Admit: 2017-12-28 | Discharge: 2017-12-28 | Disposition: A | Discharge status: Home / Self Care | Payer: 59 | Attending: Emergency Medicine | Admitting: Emergency Medicine

## 2017-12-28 ENCOUNTER — Encounter (HOSPITAL_COMMUNITY): Payer: Self-pay | Admitting: Student

## 2017-12-28 ENCOUNTER — Other Ambulatory Visit: Payer: Self-pay

## 2017-12-28 ENCOUNTER — Encounter (HOSPITAL_COMMUNITY): Payer: Self-pay | Admitting: Emergency Medicine

## 2017-12-28 ENCOUNTER — Inpatient Hospital Stay (HOSPITAL_COMMUNITY)
Admission: AD | Admit: 2017-12-28 | Discharge: 2017-12-28 | Disposition: A | Discharge status: Home / Self Care | Payer: 59 | Source: Ambulatory Visit | Attending: Obstetrics and Gynecology | Admitting: Obstetrics and Gynecology

## 2017-12-28 ENCOUNTER — Emergency Department (HOSPITAL_COMMUNITY): Payer: 59

## 2017-12-28 DIAGNOSIS — R109 Unspecified abdominal pain: Secondary | ICD-10-CM | POA: Diagnosis present

## 2017-12-28 DIAGNOSIS — K5900 Constipation, unspecified: Secondary | ICD-10-CM | POA: Diagnosis not present

## 2017-12-28 DIAGNOSIS — R1084 Generalized abdominal pain: Secondary | ICD-10-CM | POA: Diagnosis not present

## 2017-12-28 DIAGNOSIS — J45909 Unspecified asthma, uncomplicated: Secondary | ICD-10-CM | POA: Diagnosis not present

## 2017-12-28 DIAGNOSIS — J302 Other seasonal allergic rhinitis: Secondary | ICD-10-CM | POA: Diagnosis not present

## 2017-12-28 DIAGNOSIS — M79672 Pain in left foot: Secondary | ICD-10-CM | POA: Diagnosis not present

## 2017-12-28 DIAGNOSIS — Z3202 Encounter for pregnancy test, result negative: Secondary | ICD-10-CM | POA: Insufficient documentation

## 2017-12-28 DIAGNOSIS — Z79899 Other long term (current) drug therapy: Secondary | ICD-10-CM | POA: Diagnosis not present

## 2017-12-28 HISTORY — DX: Personal history of other infectious and parasitic diseases: Z86.19

## 2017-12-28 LAB — URINALYSIS, ROUTINE W REFLEX MICROSCOPIC
Bilirubin Urine: NEGATIVE
GLUCOSE, UA: NEGATIVE mg/dL
Hgb urine dipstick: NEGATIVE
KETONES UR: NEGATIVE mg/dL
Nitrite: NEGATIVE
PH: 6 (ref 5.0–8.0)
Protein, ur: 30 mg/dL — AB
SPECIFIC GRAVITY, URINE: 1.016 (ref 1.005–1.030)

## 2017-12-28 LAB — POCT PREGNANCY, URINE: Preg Test, Ur: NEGATIVE

## 2017-12-28 NOTE — ED Triage Notes (Signed)
Reports swelling and pain to left foot since dropping a computer monitor on it two weeks ago.  Slight swelling noted.  Warm and dry with positive pulses.

## 2017-12-28 NOTE — ED Notes (Signed)
Pt received discharge papers and instructions were reviewed. Pt asked this RN to shred papers. Pt received instructions on resting, icing and elevating extremity. Pt refused to sign at time of discharge.

## 2017-12-28 NOTE — ED Notes (Signed)
Patient transported to X-ray 

## 2017-12-28 NOTE — MAU Provider Note (Signed)
History     CSN: [id number]  Arrival date and time: 12/28/17 4010   First Provider Initiated Contact with Patient 12/28/17 [phone]      Chief Complaint  Patient presents with  . Abdominal Pain   HPI Denise [name] is a 27 y.o. G1P1001 non pregnant female who presents with abdominal pain. She was at [facility] an hour ago for left foot pain; was evaluated for foot pain but did not speak with them regarding her abdominal pain.  Reports intermittent abdominal pain x 2-3 weeks. Pain is cramp like in mid abdomen. Occurs about every hour. Rates 8/10 when it occurs. Unsure how long it lasts. Pain is improved by eating. Has not taken medication for pain. Nothing makes pain worse. States she was constipated a few days ago but had a normal bowel movement yesterday. Denies n/v, dysuria, vaginal bleeding, fever/chills, or vaginal discharge. She is sexually active with 1 partner x 3 months and does not use condoms. No contraception. Denies dyspareunia or post coital bleeding. Goes to Dr. Tenny Craw; has not called Dr. Tenny Craw about her symptoms. No change in symptoms tonight.     Past Medical History:  Diagnosis Date  . Allergy   . Arthritis   . Asthma   . Hx of chlamydia infection 2017  . Pregnancy induced hypertension    Pre-E  . Seasonal allergies   . Trichomonas 2017    Past Surgical History:  Procedure Laterality Date  . NO PAST SURGERIES      Family History  Problem Relation Age of Onset  . Arthritis Mother   . Diabetes Maternal Grandmother   . Arthritis Maternal Grandmother     Social History   Tobacco Use  . Smoking status: Never Smoker  . Smokeless tobacco: Never Used  Substance Use Topics  . Alcohol use: No  . Drug use: No    Allergies: No Known Allergies  Medications Prior to Admission  Medication Sig Dispense Refill Last Dose  . albuterol (PROVENTIL HFA;VENTOLIN HFA) 108 (90 Base) MCG/ACT inhaler Inhale 1 puff into the lungs every 6 (six) hours as needed for wheezing or  shortness of breath. 1 Inhaler 3 More than a month at Unknown time  . azithromycin (ZITHROMAX) 500 MG tablet Take two tablets by mouth once. 2 tablet 0   . ibuprofen (ADVIL,MOTRIN) 800 MG tablet Take 1 tablet (800 mg total) by mouth 3 (three) times daily. 21 tablet 0 Unknown at Unknown time    Review of Systems  Constitutional: Negative.   Gastrointestinal: Positive for abdominal pain and constipation. Negative for diarrhea, nausea and vomiting.  Genitourinary: Negative for dyspareunia, dysuria, vaginal bleeding and vaginal discharge.   Physical Exam   Blood pressure 130/83, pulse 81, temperature 98.2 F (36.8 C), temperature source Oral, resp. rate 18, height 5\' 5"  (1.651 m), weight 261 lb (118.4 kg).  Physical Exam  Nursing note and vitals reviewed. Constitutional: She is oriented to person, place, and time. She appears well-developed and well-nourished. No distress.  HENT:  Head: Normocephalic and atraumatic.  Eyes: Conjunctivae are normal. Right eye exhibits no discharge. Left eye exhibits no discharge. No scleral icterus.  Neck: Normal range of motion.  Cardiovascular: Normal rate, regular rhythm and normal heart sounds.  No murmur heard. Respiratory: Effort normal and breath sounds normal. No respiratory distress. She has no wheezes.  GI: Soft. Bowel sounds are normal. She exhibits no distension. There is no tenderness. There is no rebound and no guarding.  Neurological: She is alert and  oriented to person, place, and time.  Skin: Skin is warm and dry. She is not diaphoretic.  Psychiatric: She has a normal mood and affect. Her behavior is normal. Judgment and thought content normal.    MAU Course  Procedures Results for orders placed or performed during the hospital encounter of 12/28/17 (from the past 24 hour(s))  Urinalysis, Routine w reflex microscopic ([facility])     Status: Abnormal   Collection Time: 12/28/17  2:07 AM  Result Value Ref Range   Color, Urine YELLOW  YELLOW   APPearance CLOUDY (A) CLEAR   Specific Gravity, Urine 1.016 1.005 - 1.030   pH 6.0 5.0 - 8.0   Glucose, UA NEGATIVE NEGATIVE mg/dL   Hgb urine dipstick NEGATIVE NEGATIVE   Bilirubin Urine NEGATIVE NEGATIVE   Ketones, ur NEGATIVE NEGATIVE mg/dL   Protein, ur 30 (A) NEGATIVE mg/dL   Nitrite NEGATIVE NEGATIVE   Leukocytes, UA LARGE (A) NEGATIVE   RBC / HPF 6-30 0 - 5 RBC/hpf   WBC, UA 6-30 0 - 5 WBC/hpf   Bacteria, UA RARE (A) NONE SEEN   Squamous Epithelial / LPF TOO NUMEROUS TO COUNT (A) NONE SEEN   Mucus PRESENT   Pregnancy, urine POC     Status: None   Collection Time: 12/28/17  2:16 AM  Result Value Ref Range   Preg Test, Ur NEGATIVE NEGATIVE    MDM UPT negative VSS, NAD Benign abdominal exam Patient declines any further evaluation; labs or vaginal swabs. States she will call Dr. Tenny Crawoss on Monday for appointment.  D/w Dr. Claiborne Billingsallahan. Ok to discharge home.   Assessment and Plan  A: 1. Generalized abdominal pain   2. Pregnancy examination or test, negative result    P: Discharge home F/u with PCP or Dr. Tenny Crawoss Discussed reasons to return to MAU vs ED  Judeth HornErin Season Astacio 12/28/2017, 2:23 AM

## 2017-12-28 NOTE — ED Provider Notes (Signed)
[facility] EMERGENCY DEPARTMENT Provider Note   CSN: [id number] Arrival date & time: 12/28/17  0026     History   Chief Complaint Chief Complaint  Patient presents with  . Foot Swelling    HPI Denise [name] is a 27 y.o. female.  Patient presents to the ED with a chief complaint of left foot pain.  She states that her computer that sits underneath her desk fell off of its stand about 2 weeks ago and landed on her foot.  She complains of pain and swelling.  She has not tried any treatments for her symptoms.  She states that she doesn't take medicine.  She denies numbness, weakness, or tingling.   The history is provided by the patient. No language interpreter was used.    Past Medical History:  Diagnosis Date  . Allergy   . Arthritis   . Asthma   . Pregnancy induced hypertension    Pre-E  . Seasonal allergies   . Trichomonas     Patient Active Problem List   Diagnosis Date Noted  . Postpartum state 10/29/2013  . Active labor 10/28/2013  . Short cervix affecting pregnancy 07/28/2013  . Other current maternal conditions classifiable elsewhere, antepartum 07/15/2013  . Supervision of normal pregnancy 05/19/2013  . 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 Trichomonas vaginitis 04/15/2013  . Asthma 04/29/2012    Past Surgical History:  Procedure Laterality Date  . NO PAST SURGERIES      OB History    Gravida Para Term Preterm AB Living   1 1 1     1    SAB TAB Ectopic Multiple Live Births           1       Home Medications    Prior to Admission medications   Medication Sig Start Date End Date Taking? Authorizing Provider  albuterol (PROVENTIL HFA;VENTOLIN HFA) 108 (90 Base) MCG/ACT inhaler Inhale 1 puff into the lungs every 6 (six) hours as needed for wheezing or shortness of breath. 04/08/16   Ofilia Neas, PA-C  azithromycin (ZITHROMAX) 500 MG tablet Take two tablets by mouth once. 07/12/16   Adam Phenix, MD  ibuprofen (ADVIL,MOTRIN) 800 MG  tablet Take 1 tablet (800 mg total) by mouth 3 (three) times daily. 06/19/16   Emi Holes, PA-C    Family History Family History  Problem Relation Age of Onset  . Arthritis Mother   . Diabetes Maternal Grandmother   . Arthritis Maternal Grandmother     Social History Social History   Tobacco Use  . Smoking status: Never Smoker  . Smokeless tobacco: Never Used  Substance Use Topics  . Alcohol use: No  . Drug use: No     Allergies   Patient has no known allergies.   Review of Systems Review of Systems  All other systems reviewed and are negative.    Physical Exam Updated Vital Signs BP 140/89 (BP Location: Right Arm)   Pulse 78   Temp 98.1 F (36.7 C) (Oral)   Resp 18   Ht 5\' 6"  (1.676 m)   Wt 108 kg (238 lb)   SpO2 98%   BMI 38.41 kg/m   Physical Exam Nursing note and vitals reviewed.  Constitutional: Pt appears well-developed and well-nourished. No distress.  HENT:  Head: Normocephalic and atraumatic.  Eyes: Conjunctivae are normal.  Neck: Normal range of motion.  Cardiovascular: Normal rate, regular rhythm. Intact distal pulses.   Capillary refill < 3 sec.  Pulmonary/Chest: Effort normal and breath sounds normal.  Musculoskeletal:  Left foot Pt exhibits no bony abnormality or deformity.   ROM: 5/5  Strength: 5/5  Neurological: Pt  is alert. Coordination normal.  Sensation: 5/5 Skin: Skin is warm and dry. Pt is not diaphoretic.  No evidence of open wound or skin tenting Psychiatric: Pt has a normal mood and affect.     ED Treatments / Results  Labs (all labs ordered are listed, but only abnormal results are displayed) Labs Reviewed - No data to display  EKG  EKG Interpretation None       Radiology Dg Foot Complete Left  Result Date: 12/28/2017 CLINICAL DATA:  Swelling and pain to the left foot since injury 2 weeks ago EXAM: LEFT FOOT - COMPLETE 3+ VIEW COMPARISON:  None. FINDINGS: There is no evidence of fracture or dislocation.  There is no evidence of arthropathy or other focal bone abnormality. Soft tissues are unremarkable. IMPRESSION: Negative. Electronically Signed   By: Jasmine PangKim  Fujinaga M.D.   On: 12/28/2017 01:07    Procedures Procedures (including critical care time)  Medications Ordered in ED Medications - No data to display   Initial Impression / Assessment and Plan / ED Course  I have reviewed the triage vital signs and the nursing notes.  Pertinent labs & imaging results that were available during my care of the patient were reviewed by me and considered in my medical decision making (see chart for details).       Plain films reveal no fracture or dislocation.  Pt advised to follow up with PCP and/or orthopedics. Patient advised conservative therapy such as RICE, NSAIDs.   Patient will be discharged home & is agreeable with above plan. Returns precautions discussed. Pt appears safe for discharge.   Final Clinical Impressions(s) / ED Diagnoses   Final diagnoses:  Left foot pain    ED Discharge Orders    None       Roxy HorsemanBrowning, Estill Llerena, PA-C 12/28/17 0125    Shon BatonHorton, Courtney F, MD 12/29/17 [phone]

## 2017-12-28 NOTE — MAU Note (Signed)
Having abd cramps for 2 wks. L foot swollen for 2 wks. Denies vag d/c or abd vag bleeding

## 2017-12-28 NOTE — ED Notes (Signed)
ED Provider at bedside. 

## 2017-12-28 NOTE — Discharge Instructions (Signed)

## 2018-01-26 ENCOUNTER — Other Ambulatory Visit: Payer: Self-pay

## 2018-01-26 ENCOUNTER — Ambulatory Visit (INDEPENDENT_AMBULATORY_CARE_PROVIDER_SITE_OTHER): Payer: 59 | Admitting: Physician Assistant

## 2018-01-26 ENCOUNTER — Encounter: Payer: Self-pay | Admitting: Physician Assistant

## 2018-01-26 VITALS — BP 142/86 | HR 94 | Temp 97.9°F | Resp 18 | Ht 66.54 in | Wt 258.8 lb

## 2018-01-26 DIAGNOSIS — Z13220 Encounter for screening for lipoid disorders: Secondary | ICD-10-CM

## 2018-01-26 DIAGNOSIS — Z111 Encounter for screening for respiratory tuberculosis: Secondary | ICD-10-CM

## 2018-01-26 DIAGNOSIS — Z1389 Encounter for screening for other disorder: Secondary | ICD-10-CM

## 2018-01-26 DIAGNOSIS — Z130 Encounter for screening for diseases of the blood and blood-forming organs and certain disorders involving the immune mechanism: Secondary | ICD-10-CM | POA: Diagnosis not present

## 2018-01-26 DIAGNOSIS — Z0000 Encounter for general adult medical examination without abnormal findings: Secondary | ICD-10-CM | POA: Diagnosis not present

## 2018-01-26 DIAGNOSIS — Z13228 Encounter for screening for other metabolic disorders: Secondary | ICD-10-CM | POA: Diagnosis not present

## 2018-01-26 DIAGNOSIS — Z1329 Encounter for screening for other suspected endocrine disorder: Secondary | ICD-10-CM | POA: Diagnosis not present

## 2018-01-26 DIAGNOSIS — O139 Gestational [pregnancy-induced] hypertension without significant proteinuria, unspecified trimester: Secondary | ICD-10-CM | POA: Insufficient documentation

## 2018-01-26 DIAGNOSIS — Z8759 Personal history of other complications of pregnancy, childbirth and the puerperium: Secondary | ICD-10-CM | POA: Insufficient documentation

## 2018-01-26 DIAGNOSIS — J45990 Exercise induced bronchospasm: Secondary | ICD-10-CM | POA: Diagnosis not present

## 2018-01-26 LAB — POCT URINALYSIS DIP (MANUAL ENTRY)
BILIRUBIN UA: NEGATIVE
GLUCOSE UA: NEGATIVE mg/dL
Ketones, POC UA: NEGATIVE mg/dL
Leukocytes, UA: NEGATIVE
NITRITE UA: NEGATIVE
Protein Ur, POC: 30 mg/dL — AB
RBC UA: NEGATIVE
SPEC GRAV UA: 1.025 (ref 1.010–1.025)
Urobilinogen, UA: 0.2 E.U./dL
pH, UA: 6 (ref 5.0–8.0)

## 2018-01-26 MED ORDER — ALBUTEROL SULFATE HFA 108 (90 BASE) MCG/ACT IN AERS: 1.0000 | INHALATION_SPRAY | RESPIRATORY_TRACT | 2 refills | Status: DC | PRN

## 2018-01-26 NOTE — Progress Notes (Signed)
Denise [name]  MRN: [id number] DOB: 03/06/1991  Subjective:  Pt is a 27 y.o. female who presents for annual physical exam. Pt is not fasting today. 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   Diet: All over the place. Trying to eat healthier. Trying to eat more protein and vegetables. Gets some dairy throughout the milk. Drinks mostly water. Does not take a multivitamin.   Exercise: Walking and steps at her house. Yoga every Monday.   Sleep:<6 hours a night.   BM: Daily.  Menstrual cycles: Regular, monthly. Typically last 6-7 days. Denies dysmenorrhea and menorrhagia. She is sexually active with monogamous partner. She does not want to be on contraception at this time. Followed closely by gynecology. She was just tested for STDs, negative.   Last annual physical exam: 2016 Last dental exam: 2019, brushes twice daily Last vision exam:2018, wears Rx eyeglasses.  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 Needs refill.   Patient Active Problem List   Diagnosis Date Noted  . Postpartum state 10/29/2013  . Active labor 10/28/2013  . Short cervix affecting pregnancy 07/28/2013  . Other current maternal conditions classifiable elsewhere, antepartum 07/15/2013  . Supervision of normal pregnancy 05/19/2013  . 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 Trichomonas vaginitis 04/15/2013  . Asthma 04/29/2012    No current outpatient medications on file prior to visit.   No current facility-administered medications on file prior to visit.     No Known Allergies  Social History   Socioeconomic History  . Marital status: Single    Spouse name: None  . Number of children: 1  . Years of education: None  . Highest education level: None  Social Needs  .  Financial resource strain: None  . Food insecurity - worry: None  . Food insecurity - inability: None  . Transportation needs - medical: None  . Transportation needs - non-medical: None  Occupational History  . None  Tobacco Use  . Smoking status: Never Smoker  . Smokeless tobacco: Never Used  Substance and Sexual Activity  . Alcohol use: No  . Drug use: No  . Sexual activity: Yes  Other Topics Concern  . None  Social History Narrative  . None    Past Surgical History:  Procedure Laterality Date  . NO PAST SURGERIES      Family History  Problem Relation Age of Onset  . Arthritis Mother   . Diabetes Maternal Grandmother   . Arthritis Maternal Grandmother     Review of Systems  Constitutional: Negative for activity change, appetite change, chills, diaphoresis, fatigue, fever and unexpected weight change.  HENT: Negative for congestion, dental problem, drooling, ear discharge, ear pain, facial swelling, hearing loss, mouth sores, nosebleeds, postnasal drip, rhinorrhea, sinus pressure, sinus pain, sneezing, sore throat, tinnitus, trouble swallowing and voice change.   Eyes: Negative for photophobia, pain, discharge, redness, itching and visual disturbance.  Respiratory: Negative for apnea, cough, choking, chest tightness, shortness of breath, wheezing and stridor.   Cardiovascular: Negative for chest pain, palpitations and leg swelling.  Gastrointestinal: Negative for abdominal distention, abdominal pain, anal bleeding, blood in stool, constipation, diarrhea, nausea, rectal pain and vomiting.  Endocrine: Negative for cold intolerance, heat intolerance, polydipsia, polyphagia  and polyuria.  Genitourinary: Negative for decreased urine volume, difficulty urinating, dyspareunia, dysuria, enuresis, flank pain, frequency, genital sores, hematuria, menstrual problem, pelvic pain, urgency, vaginal bleeding, vaginal discharge and vaginal pain.  Musculoskeletal: Negative for arthralgias,  back pain, gait problem, joint swelling, myalgias, neck pain and neck stiffness.  Skin: Negative for color change, pallor, rash and wound.  Allergic/Immunologic: Positive for environmental allergies. Negative for food allergies and immunocompromised state.  Neurological: Negative for dizziness, tremors, seizures, syncope, facial asymmetry, speech difficulty, weakness, light-headedness, numbness and headaches.  Hematological: Negative for adenopathy. Does not bruise/bleed easily.  Psychiatric/Behavioral: Negative for agitation, behavioral problems, confusion, decreased concentration, dysphoric mood, hallucinations, self-injury, sleep disturbance and suicidal ideas. The patient is not nervous/anxious and is not hyperactive.     Objective:  BP (!) 142/86 (BP Location: Left Arm, Patient Position: Sitting, Cuff Size: Large)   Pulse 94   Temp 97.9 F (36.6 C) (Oral)   Resp 18   Ht 5' 6.54" (1.69 m)   Wt 258 lb 12.8 oz (117.4 kg)   LMP 01/08/2018 (Approximate)   SpO2 99%   BMI 41.10 kg/m   Physical Exam  Constitutional: She is oriented to person, place, and time and well-developed, well-nourished, and in no distress.  HENT:  Head: Normocephalic and atraumatic.  Right Ear: Hearing, tympanic membrane, external ear and ear canal normal.  Left Ear: Hearing, tympanic membrane, external ear and ear canal normal.  Nose: Nose normal.  Mouth/Throat: Uvula is midline, oropharynx is clear and moist and mucous membranes are normal. No oropharyngeal exudate.  Eyes: Conjunctivae, EOM and lids are normal. Pupils are equal, round, and reactive to light. No scleral icterus.  Neck: Trachea normal and normal range of motion. No thyroid mass and no thyromegaly present.  Cardiovascular: Normal rate, regular rhythm, normal heart sounds and intact distal pulses.  Pulmonary/Chest: Effort normal and breath sounds normal.  Abdominal: Soft. Normal appearance and bowel sounds are normal. There is no tenderness.    Lymphadenopathy:       Head (right side): No tonsillar, no preauricular, no posterior auricular and no occipital adenopathy present.       Head (left side): No tonsillar, no preauricular, no posterior auricular and no occipital adenopathy present.    She has no cervical adenopathy.       Right: No supraclavicular adenopathy present.       Left: No supraclavicular adenopathy present.  Neurological: She is alert and oriented to person, place, and time. She has normal sensation, normal strength and normal reflexes. Gait normal.  Skin: Skin is warm and dry.  Psychiatric: Affect normal.    Visual Acuity Screening   Right eye Left eye Both eyes  Without correction:     With correction: '20/15 20/15 20/15 '    Assessment and Plan :  Discussed healthy lifestyle, diet, exercise, preventative care, vaccinations, and addressed patient's concerns. Plan for follow up in one year. Otherwise, plan for specific conditions below.  1. Annual physical exam Await lab results.  Work form was completed and returned to patient.  2. Screening, anemia, deficiency, iron - CBC with Differential/Platelet  3. Screening for metabolic disorder - [id number]+LOVF  4. Screening cholesterol level - Lipid panel  5. Screening for thyroid disorder - TSH  6. Screening for hematuria or proteinuria - POCT urinalysis dipstick  7. Screening-pulmonary TB Labs pending. Placed TB screen form in my provider box. Will complete once labs results return and contact pt to come and pick up form from office. Given pt  a copy of uncompleted form.  - QuantiFERON-TB Gold Plus  8. Exercise-induced asthma - albuterol (PROAIR HFA) 108 (90 Base) MCG/ACT inhaler; Inhale 1-2 puffs into the lungs every 4 (four) hours as needed for wheezing or shortness of breath.  Dispense: 6.7 g; Refill: 2  Tenna Delaine, PA-C  [facility] 01/26/2018 3:27 PM

## 2018-01-26 NOTE — Patient Instructions (Addendum)
Once we have your TB test result, we will contact you and you can return here to pick up the form. Thank you for letting me participate in your health and well being.   Health Maintenance, Female Adopting a healthy lifestyle and getting preventive care can go a long way to promote health and wellness. Talk with your health care provider about what schedule of regular examinations is right for you. This is a good chance for you to check in with your provider about disease prevention and staying healthy. In between checkups, there are plenty of things you can do on your own. Experts have done a lot of research about which lifestyle changes and preventive measures are most likely to keep you healthy. Ask your health care provider for more information. Weight and diet Eat a healthy diet  Be sure to include plenty of vegetables, fruits, low-fat dairy products, and lean protein.  Do not eat a lot of foods high in solid fats, added sugars, or salt.  Get regular exercise. This is one of the most important things you can do for your health. ? Most adults should exercise for at least 150 minutes each week. The exercise should increase your heart rate and make you sweat (moderate-intensity exercise). ? Most adults should also do strengthening exercises at least twice a week. This is in addition to the moderate-intensity exercise.  Maintain a healthy weight  Body mass index (BMI) is a measurement that can be used to identify possible weight problems. It estimates body fat based on height and weight. Your health care provider can help determine your BMI and help you achieve or maintain a healthy weight.  For females 64 years of age and older: ? A BMI below 18.5 is considered underweight. ? A BMI of 18.5 to 24.9 is normal. ? A BMI of 25 to 29.9 is considered overweight. ? A BMI of 30 and above is considered obese.  Watch levels of cholesterol and blood lipids  You should start having your blood tested  for lipids and cholesterol at 26 years of age, then have this test every 5 years.  You may need to have your cholesterol levels checked more often if: ? Your lipid or cholesterol levels are high. ? You are older than 26 years of age. ? You are at high risk for heart disease.  Cancer screening Lung Cancer  Lung cancer screening is recommended for adults 6-5 years old who are at high risk for lung cancer because of a history of smoking.  A yearly low-dose CT scan of the lungs is recommended for people who: ? Currently smoke. ? Have quit within the past 15 years. ? Have at least a 30-pack-year history of smoking. A pack year is smoking an average of one pack of cigarettes a day for 1 year.  Yearly screening should continue until it has been 15 years since you quit.  Yearly screening should stop if you develop a health problem that would prevent you from having lung cancer treatment.  Breast Cancer  Practice breast self-awareness. This means understanding how your breasts normally appear and feel.  It also means doing regular breast self-exams. Let your health care provider know about any changes, no matter how small.  If you are in your 20s or 30s, you should have a clinical breast exam (CBE) by a health care provider every 1-3 years as part of a regular health exam.  If you are 51 or older, have a CBE every year.  Also consider having a breast X-ray (mammogram) every year.  If you have a family history of breast cancer, talk to your health care provider about genetic screening.  If you are at high risk for breast cancer, talk to your health care provider about having an MRI and a mammogram every year.  Breast cancer gene (BRCA) assessment is recommended for women who have family members with BRCA-related cancers. BRCA-related cancers include: ? Breast. ? Ovarian. ? Tubal. ? Peritoneal cancers.  Results of the assessment will determine the need for genetic counseling and BRCA1  and BRCA2 testing.  Cervical Cancer Your health care provider may recommend that you be screened regularly for cancer of the pelvic organs (ovaries, uterus, and vagina). This screening involves a pelvic examination, including checking for microscopic changes to the surface of your cervix (Pap test). You may be encouraged to have this screening done every 3 years, beginning at age 26.  For women ages 28-65, health care providers may recommend pelvic exams and Pap testing every 3 years, or they may recommend the Pap and pelvic exam, combined with testing for human papilloma virus (HPV), every 5 years. Some types of HPV increase your risk of cervical cancer. Testing for HPV may also be done on women of any age with unclear Pap test results.  Other health care providers may not recommend any screening for nonpregnant women who are considered low risk for pelvic cancer and who do not have symptoms. Ask your health care provider if a screening pelvic exam is right for you.  If you have had past treatment for cervical cancer or a condition that could lead to cancer, you need Pap tests and screening for cancer for at least 20 years after your treatment. If Pap tests have been discontinued, your risk factors (such as having a new sexual partner) need to be reassessed to determine if screening should resume. Some women have medical problems that increase the chance of getting cervical cancer. In these cases, your health care provider may recommend more frequent screening and Pap tests.  Colorectal Cancer  This type of cancer can be detected and often prevented.  Routine colorectal cancer screening usually begins at 26 years of age and continues through 26 years of age.  Your health care provider may recommend screening at an earlier age if you have risk factors for colon cancer.  Your health care provider may also recommend using home test kits to check for hidden blood in the stool.  A small camera at  the end of a tube can be used to examine your colon directly (sigmoidoscopy or colonoscopy). This is done to check for the earliest forms of colorectal cancer.  Routine screening usually begins at age 29.  Direct examination of the colon should be repeated every 5-10 years through 26 years of age. However, you may need to be screened more often if early forms of precancerous polyps or small growths are found.  Skin Cancer  Check your skin from head to toe regularly.  Tell your health care provider about any new moles or changes in moles, especially if there is a change in a mole's shape or color.  Also tell your health care provider if you have a mole that is larger than the size of a pencil eraser.  Always use sunscreen. Apply sunscreen liberally and repeatedly throughout the day.  Protect yourself by wearing long sleeves, pants, a wide-brimmed hat, and sunglasses whenever you are outside.  Heart disease, diabetes, and high  blood pressure  High blood pressure causes heart disease and increases the risk of stroke. High blood pressure is more likely to develop in: ? People who have blood pressure in the high end of the normal range (130-139/85-89 mm Hg). ? People who are overweight or obese. ? People who are African American.  If you are 76-25 years of age, have your blood pressure checked every 3-5 years. If you are 19 years of age or older, have your blood pressure checked every year. You should have your blood pressure measured twice--once when you are at a hospital or clinic, and once when you are not at a hospital or clinic. Record the average of the two measurements. To check your blood pressure when you are not at a hospital or clinic, you can use: ? An automated blood pressure machine at a pharmacy. ? A home blood pressure monitor.  If you are between 87 years and 24 years old, ask your health care provider if you should take aspirin to prevent strokes.  Have regular diabetes  screenings. This involves taking a blood sample to check your fasting blood sugar level. ? If you are at a normal weight and have a low risk for diabetes, have this test once every three years after 26 years of age. ? If you are overweight and have a high risk for diabetes, consider being tested at a younger age or more often. Preventing infection Hepatitis B  If you have a higher risk for hepatitis B, you should be screened for this virus. You are considered at high risk for hepatitis B if: ? You were born in a country where hepatitis B is common. Ask your health care provider which countries are considered high risk. ? Your parents were born in a high-risk country, and you have not been immunized against hepatitis B (hepatitis B vaccine). ? You have HIV or AIDS. ? You use needles to inject street drugs. ? You live with someone who has hepatitis B. ? You have had sex with someone who has hepatitis B. ? You get hemodialysis treatment. ? You take certain medicines for conditions, including cancer, organ transplantation, and autoimmune conditions.  Hepatitis C  Blood testing is recommended for: ? Everyone born from [birth year] through 1964. ? Anyone with known risk factors for hepatitis C.  Sexually transmitted infections (STIs)  You should be screened for sexually transmitted infections (STIs) including gonorrhea and chlamydia if: ? You are sexually active and are younger than 26 years of age. ? You are older than 26 years of age and your health care provider tells you that you are at risk for this type of infection. ? Your sexual activity has changed since you were last screened and you are at an increased risk for chlamydia or gonorrhea. Ask your health care provider if you are at risk.  If you do not have HIV, but are at risk, it may be recommended that you take a prescription medicine daily to prevent HIV infection. This is called pre-exposure prophylaxis (PrEP). You are considered at risk  if: ? You are sexually active and do not regularly use condoms or know the HIV status of your partner(s). ? You take drugs by injection. ? You are sexually active with a partner who has HIV.  Talk with your health care provider about whether you are at high risk of being infected with HIV. If you choose to begin PrEP, you should first be tested for HIV. You should then be tested  every 3 months for as long as you are taking PrEP. Pregnancy  If you are premenopausal and you may become pregnant, ask your health care provider about preconception counseling.  If you may become pregnant, take 400 to 800 micrograms (mcg) of folic acid every day.  If you want to prevent pregnancy, talk to your health care provider about birth control (contraception). Osteoporosis and menopause  Osteoporosis is a disease in which the bones lose minerals and strength with aging. This can result in serious bone fractures. Your risk for osteoporosis can be identified using a bone density scan.  If you are 44 years of age or older, or if you are at risk for osteoporosis and fractures, ask your health care provider if you should be screened.  Ask your health care provider whether you should take a calcium or vitamin D supplement to lower your risk for osteoporosis.  Menopause may have certain physical symptoms and risks.  Hormone replacement therapy may reduce some of these symptoms and risks. Talk to your health care provider about whether hormone replacement therapy is right for you. Follow these instructions at home:  Schedule regular health, dental, and eye exams.  Stay current with your immunizations.  Do not use any tobacco products including cigarettes, chewing tobacco, or electronic cigarettes.  If you are pregnant, do not drink alcohol.  If you are breastfeeding, limit how much and how often you drink alcohol.  Limit alcohol intake to no more than 1 drink per day for nonpregnant women. One drink  equals 12 ounces of beer, 5 ounces of wine, or 1 ounces of hard liquor.  Do not use street drugs.  Do not share needles.  Ask your health care provider for help if you need support or information about quitting drugs.  Tell your health care provider if you often feel depressed.  Tell your health care provider if you have ever been abused or do not feel safe at home. This information is not intended to replace advice given to you by your health care provider. Make sure you discuss any questions you have with your health care provider. Document Released: 05/20/2011 Document Revised: 04/11/2016 Document Reviewed: 08/08/2015 Elsevier Interactive Patient Education  2018 [employer].   To use the inhalation aerosol: The inhaler should be at room temperature before you use it.  Insert the metal canister firmly and fully into the actuator. This actuator should not be used with other inhaled medicines.  Remove the cap and look at the mouthpiece to make sure it is clean.  Point the inhaler away from your face. Shake the inhaler well and test spray it in the air 3 times for ProAir HFA or 4 times for Proventil HFA before using it for the first time or if the inhaler has not been used for more than 2 weeks.  To inhale this medicine, breathe out fully, trying to get as much air out of the lungs as possible. Put the mouthpiece just in front of your mouth with the canister upright.  Open your mouth and breathe in slowly and deeply (like yawning), and at the same time firmly press down once on the top of the canister.  Hold your breath for about 10 seconds, then breathe out slowly.  If you are supposed to use more than one puff, wait 1 minute before inhaling the second puff. Repeat these steps for the second puff, starting with shaking the inhaler.  When you have finished all of your doses, rinse your  mouth with water and spit the water out.  Clean the inhaler mouthpiece at least once a week with warm  running water for 30 seconds, and air dry it completely.  If you need to use the inhaler before it is completely dry, shake off the excess water, replace the canister, and spray it 2 times in the air away from the face. Use your regular dose.  After using the inhaler, wash the mouthpiece again and dry it completely.  If the mouthpiece becomes blocked, washing it will help.  The Proventil HFA inhaler has a window that shows the number of doses remaining. This tells you when you are getting low on medicine. The counter will turn red when there are only 20 doses left, to remind you to refill your prescription.  To use the inhalation powder: Take the inhaler from the foil pouch before you use it for the first time.  The inhaler provides about 200 inhalations. The dose counter will change to red when there are "20" doses left. Call your doctor or pharmacist for a refill of prescription or medicine.  Make sure the cap is closed before using this medicine. Do not open the cap unless you are going to use it.  Hold the inhaler upright as you open the cap fully until you hear a "click". Your inhaler is now ready to use.  To inhale this medicine, breathe out fully, trying to get as much air out of the lungs as possible. Put the mouthpiece fully into your mouth and close your lips around it.  Breathe in through your mouth as deeply as you can until you have taken a full deep breath.  Do not block the vent above the mouthpiece with your lips or fingers.  Hold your breath for about 10 seconds or as long as you comfortably can.  Remove the inhaler from your mouth and check the dose counter to make sure you received the medicine.  Close the cap firmly over the mouthpiece after using the inhaler. Always close the cap after each use.  If you are supposed to use more than one puff, repeat these steps for the second puff, starting with opening the cap fully.  Do not use a spacer or volume holding chamber together with  the [place].  Keep the inhaler clean and dry at all times. Do not wash or put any part of the inhaler in water. Replace the ProAir Digihaler if it has been washed or placed in water.  If you need to clean the mouthpiece, wipe it gently with a dry cloth or tissue.     Albuterol inhalation aerosol What is this medicine? ALBUTEROL (al [place]) is a bronchodilator. It helps open up the airways in your lungs to make it easier to breathe. This medicine is used to treat and to prevent bronchospasm. This medicine may be used for other purposes; ask your health care provider or pharmacist if you have questions. COMMON BRAND NAME(S): Proair HFA, Proventil, Proventil HFA, Respirol, Ventolin, Ventolin HFA What should I tell my health care provider before I take this medicine? They need to know if you have any of the following conditions: -diabetes -heart disease or irregular heartbeat -high blood pressure -pheochromocytoma -seizures -thyroid disease -an unusual or allergic reaction to albuterol, levalbuterol, sulfites, other medicines, foods, dyes, or preservatives -pregnant or trying to get pregnant -breast-feeding How should I use this medicine? This medicine is for inhalation through the mouth. Follow the directions on your prescription  label. Take your medicine at regular intervals. Do not use more often than directed. Make sure that you are using your inhaler correctly. Ask you doctor or health care provider if you have any questions. Talk to your pediatrician regarding the use of this medicine in children. Special care may be needed. Overdosage: If you think you have taken too much of this medicine contact a poison control center or emergency room at once. NOTE: This medicine is only for you. Do not share this medicine with others. What if I miss a dose? If you miss a dose, use it as soon as you can. If it is almost time for your next dose, use only that dose. Do not use  double or extra doses. What may interact with this medicine? -anti-infectives like chloroquine and pentamidine -caffeine -cisapride -diuretics -medicines for colds -medicines for depression or for emotional or psychotic conditions -medicines for weight loss including some herbal products -methadone -some antibiotics like clarithromycin, erythromycin, levofloxacin, and linezolid -some heart medicines -steroid hormones like dexamethasone, cortisone, hydrocortisone -theophylline -thyroid hormones This list may not describe all possible interactions. Give your health care provider a list of all the medicines, herbs, non-prescription drugs, or dietary supplements you use. Also tell them if you smoke, drink alcohol, or use illegal drugs. Some items may interact with your medicine. What should I watch for while using this medicine? Tell your doctor or health care professional if your symptoms do not improve. Do not use extra albuterol. If your asthma or bronchitis gets worse while you are using this medicine, call your doctor right away. If your mouth gets dry try chewing sugarless gum or sucking hard candy. Drink water as directed. What side effects may I notice from receiving this medicine? Side effects that you should report to your doctor or health care professional as soon as possible: -allergic reactions like skin rash, itching or hives, swelling of the face, lips, or tongue -breathing problems -chest pain -feeling faint or lightheaded, falls -high blood pressure -irregular heartbeat -fever -muscle cramps or weakness -pain, tingling, numbness in the hands or feet -vomiting Side effects that usually do not require medical attention (report to your doctor or health care professional if they continue or are bothersome): -cough -difficulty sleeping -headache -nervousness or trembling -stomach upset -stuffy or runny nose -throat irritation -unusual taste This list may not describe  all possible side effects. Call your doctor for medical advice about side effects. You may report side effects to FDA at 1-800-FDA-1088. Where should I keep my medicine? Keep out of the reach of children. Store at room temperature between 15 and 30 degrees C (59 and 86 degrees F). The contents are under pressure and may burst when exposed to heat or flame. Do not freeze. This medicine does not work as well if it is too cold. Throw away any unused medicine after the expiration date. Inhalers need to be thrown away after the labeled number of puffs have been used or by the expiration date; whichever comes first. Ventolin HFA should be thrown away 12 months after removing from foil pouch. Check the instructions that come with your medicine. NOTE: This sheet is a summary. It may not cover all possible information. If you have questions about this medicine, talk to your doctor, pharmacist, or health care provider.  2018 Elsevier/Gold Standard (2013-04-22 10:57:17)   IF you received an x-ray today, you will receive an invoice from [facility]. Please contact [facility] at [phone] with questions or concerns regarding  your invoice.   IF you received labwork today, you will receive an invoice from [place]. Please contact [facility] at [phone] with questions or concerns regarding your invoice.   Our billing staff will not be able to assist you with questions regarding bills from these companies.  You will be contacted with the lab results as soon as they are available. The fastest way to get your results is to activate your My Chart account. Instructions are located on the last page of this paperwork. If you have not heard from [place] regarding the results in 2 weeks, please contact this office.

## 2018-01-26 NOTE — Progress Notes (Signed)

## 2018-01-31 LAB — CMP14+EGFR
A/G RATIO: 1.4 (ref 1.2–2.2)
ALT: 14 IU/L (ref 0–32)
AST: 16 IU/L (ref 0–40)
Albumin: 4.3 g/dL (ref 3.5–5.5)
Alkaline Phosphatase: 74 IU/L (ref 39–117)
BUN/Creatinine Ratio: 11 (ref 9–23)
BUN: 11 mg/dL (ref 6–20)
Bilirubin Total: <0.2 mg/dL (ref 0.0–1.2)
CO2: 20 mmol/L (ref 20–29)
CREATININE: 1.04 mg/dL — AB (ref 0.57–1.00)
Calcium: 9.1 mg/dL (ref 8.7–10.2)
Chloride: 105 mmol/L (ref 96–106)
GFR calc Af Amer: 86 mL/min/{1.73_m2} (ref >59)
GFR, EST NON AFRICAN AMERICAN: 75 mL/min/{1.73_m2} (ref >59)
GLUCOSE: 117 mg/dL — AB (ref 65–99)
Globulin, Total: 3.1 g/dL (ref 1.5–4.5)
POTASSIUM: 4.5 mmol/L (ref 3.5–5.2)
Sodium: 139 mmol/L (ref 134–144)
Total Protein: 7.4 g/dL (ref 6.0–8.5)

## 2018-01-31 LAB — CBC WITH DIFFERENTIAL/PLATELET
BASOS ABS: 0 10*3/uL (ref 0.0–0.2)
Basos: 1 %
EOS (ABSOLUTE): 0.2 10*3/uL (ref 0.0–0.4)
Eos: 5 %
Hematocrit: 35.5 % (ref 34.0–46.6)
Hemoglobin: 11.7 g/dL (ref 11.1–15.9)
IMMATURE GRANULOCYTES: 0 %
Immature Grans (Abs): 0 10*3/uL (ref 0.0–0.1)
Lymphocytes Absolute: 1.4 10*3/uL (ref 0.7–3.1)
Lymphs: 32 %
MCH: 23.3 pg — ABNORMAL LOW (ref 26.6–33.0)
MCHC: 33 g/dL (ref 31.5–35.7)
MCV: 71 fL — ABNORMAL LOW (ref 79–97)
MONOS ABS: 0.3 10*3/uL (ref 0.1–0.9)
Monocytes: 7 %
NEUTROS PCT: 55 %
Neutrophils Absolute: 2.5 10*3/uL (ref 1.4–7.0)
PLATELETS: 321 10*3/uL (ref 150–379)
RBC: 5.02 x10E6/uL (ref 3.77–5.28)
RDW: 16.4 % — AB (ref 12.3–15.4)
WBC: 4.4 10*3/uL (ref 3.4–10.8)

## 2018-01-31 LAB — QUANTIFERON-TB GOLD PLUS
QUANTIFERON TB1 AG VALUE: 0.14 [IU]/mL
QUANTIFERON-TB GOLD PLUS: NEGATIVE
QuantiFERON Nil Value: 0.11 IU/mL
QuantiFERON TB2 Ag Value: 0.08 IU/mL

## 2018-01-31 LAB — TSH: TSH: 1.59 u[IU]/mL (ref 0.450–4.500)

## 2018-01-31 LAB — LIPID PANEL
CHOL/HDL RATIO: 2.1 ratio (ref 0.0–4.4)
Cholesterol, Total: 168 mg/dL (ref 100–199)
HDL: 81 mg/dL (ref >39)
LDL Calculated: 67 mg/dL (ref 0–99)
TRIGLYCERIDES: 102 mg/dL (ref 0–149)
VLDL Cholesterol Cal: 20 mg/dL (ref 5–40)

## 2018-02-02 ENCOUNTER — Encounter: Payer: Self-pay | Admitting: Physician Assistant

## 2018-02-02 LAB — TB SKIN TEST: TB SKIN TEST: NEGATIVE

## 2018-06-05 ENCOUNTER — Telehealth: Payer: Self-pay

## 2018-06-05 NOTE — Telephone Encounter (Signed)
Pt form for staff health assessment has been put in Lynda's box for completion.

## 2018-06-08 NOTE — Telephone Encounter (Signed)
Completed form and placed in "to be faxed" box at 104.

## 2018-08-15 ENCOUNTER — Inpatient Hospital Stay (HOSPITAL_COMMUNITY)
Admission: AD | Admit: 2018-08-15 | Discharge: 2018-08-16 | Disposition: A | Discharge status: Home / Self Care | Payer: Self-pay | Source: Ambulatory Visit | Attending: Obstetrics | Admitting: Obstetrics

## 2018-08-15 ENCOUNTER — Other Ambulatory Visit: Payer: Self-pay

## 2018-08-15 ENCOUNTER — Encounter (HOSPITAL_COMMUNITY): Payer: Self-pay | Admitting: *Deleted

## 2018-08-15 ENCOUNTER — Inpatient Hospital Stay (HOSPITAL_COMMUNITY): Payer: Self-pay

## 2018-08-15 DIAGNOSIS — G44221 Chronic tension-type headache, intractable: Secondary | ICD-10-CM | POA: Insufficient documentation

## 2018-08-15 DIAGNOSIS — O10911 Unspecified pre-existing hypertension complicating pregnancy, first trimester: Secondary | ICD-10-CM

## 2018-08-15 DIAGNOSIS — N760 Acute vaginitis: Secondary | ICD-10-CM

## 2018-08-15 DIAGNOSIS — R109 Unspecified abdominal pain: Secondary | ICD-10-CM | POA: Insufficient documentation

## 2018-08-15 DIAGNOSIS — O26891 Other specified pregnancy related conditions, first trimester: Secondary | ICD-10-CM

## 2018-08-15 DIAGNOSIS — O020 Blighted ovum and nonhydatidiform mole: Secondary | ICD-10-CM

## 2018-08-15 DIAGNOSIS — B9689 Other specified bacterial agents as the cause of diseases classified elsewhere: Secondary | ICD-10-CM | POA: Insufficient documentation

## 2018-08-15 DIAGNOSIS — O23591 Infection of other part of genital tract in pregnancy, first trimester: Secondary | ICD-10-CM | POA: Insufficient documentation

## 2018-08-15 DIAGNOSIS — Z3A01 Less than 8 weeks gestation of pregnancy: Secondary | ICD-10-CM | POA: Insufficient documentation

## 2018-08-15 DIAGNOSIS — O10011 Pre-existing essential hypertension complicating pregnancy, first trimester: Secondary | ICD-10-CM | POA: Insufficient documentation

## 2018-08-15 LAB — WET PREP, GENITAL
Sperm: NONE SEEN
Trich, Wet Prep: NONE SEEN
Yeast Wet Prep HPF POC: NONE SEEN

## 2018-08-15 LAB — URINALYSIS, ROUTINE W REFLEX MICROSCOPIC
Bilirubin Urine: NEGATIVE
Glucose, UA: NEGATIVE mg/dL
Hgb urine dipstick: NEGATIVE
Ketones, ur: NEGATIVE mg/dL
Nitrite: NEGATIVE
Protein, ur: 100 mg/dL — AB
Specific Gravity, Urine: 1.021 (ref 1.005–1.030)
pH: 5 (ref 5.0–8.0)

## 2018-08-15 LAB — CBC
HCT: 38.8 % (ref 36.0–46.0)
Hemoglobin: 12.4 g/dL (ref 12.0–15.0)
MCH: 24 pg — ABNORMAL LOW (ref 26.0–34.0)
MCHC: 32 g/dL (ref 30.0–36.0)
MCV: 75 fL — ABNORMAL LOW (ref 78.0–100.0)
Platelets: 320 10*3/uL (ref 150–400)
RBC: 5.17 MIL/uL — ABNORMAL HIGH (ref 3.87–5.11)
RDW: 15.9 % — ABNORMAL HIGH (ref 11.5–15.5)
WBC: 4.9 10*3/uL (ref 4.0–10.5)

## 2018-08-15 LAB — ABO/RH: ABO/RH(D): B POS

## 2018-08-15 LAB — POCT PREGNANCY, URINE: PREG TEST UR: POSITIVE — AB

## 2018-08-15 LAB — HCG, QUANTITATIVE, PREGNANCY: hCG, Beta Chain, Quant, S: 1646 m[IU]/mL — ABNORMAL HIGH (ref <5)

## 2018-08-15 MED ORDER — BUTALBITAL-APAP-CAFFEINE 50-325-40 MG PO TABS: 1.0000 | ORAL_TABLET | Freq: Four times a day (QID) | ORAL | 0 refills | Status: DC | PRN

## 2018-08-15 MED ORDER — ACETAMINOPHEN 500 MG PO TABS
1000.0000 mg | ORAL_TABLET | Freq: Once | ORAL | Status: AC
  Administered 2018-08-15: 1000 mg via ORAL
  Filled 2018-08-15: qty 2

## 2018-08-15 MED ORDER — METRONIDAZOLE 500 MG PO TABS: 500.0000 mg | ORAL_TABLET | Freq: Two times a day (BID) | ORAL | 0 refills | Status: DC

## 2018-08-15 NOTE — MAU Provider Note (Signed)
History     CSN: [id number]  Arrival date and time: 08/15/18 2126   First Provider Initiated Contact with Patient 08/15/18 2211      Chief Complaint  Patient presents with  . Abdominal Pain  . Headache   Denise [name] is a 27 y.o. G2P1 at [redacted]w[redacted]d by LMP who presents to MAU with complaints of abdominal pain and headache. She reports HA has been occurring intermittently since 9/13- describes headache as pressure and throbbing "like something is banging on her head". She rates pain 8/10- has taken 1/2 pill of ibuprofen prior to arrival to MAU with no relief. She denies hx of HA or migraines. She reports abdominal pain that started 2-3 days ago, describes abdominal pain as lower abdominal cramping that is not specific to one side. She rates pain 9/10. Has taken a pregnancy test at home that was positive. She denies N/V, urinary symptoms or vaginal bleeding.    OB History    Gravida  1   Para  1   Term  1   Preterm      AB      Living  1     SAB      TAB      Ectopic      Multiple      Live Births  1           Past Medical History:  Diagnosis Date  . Allergy   . Arthritis   . Asthma   . Hx of chlamydia infection 2017  . Pregnancy induced hypertension    Pre-E  . Seasonal allergies   . Trichomonas 2017    Past Surgical History:  Procedure Laterality Date  . NO PAST SURGERIES      Family History  Problem Relation Age of Onset  . Arthritis Mother   . Diabetes Maternal Grandmother   . Arthritis Maternal Grandmother     Social History   Tobacco Use  . Smoking status: Never Smoker  . Smokeless tobacco: Never Used  Substance Use Topics  . Alcohol use: No  . Drug use: No    Allergies: No Known Allergies  Medications Prior to Admission  Medication Sig Dispense Refill Last Dose  . albuterol (PROAIR HFA) 108 (90 Base) MCG/ACT inhaler Inhale 1-2 puffs into the lungs every 4 (four) hours as needed for wheezing or shortness of breath. 6.7 g 2 Past  Month at Unknown time    Review of Systems  Constitutional: Negative.   Respiratory: Negative.   Cardiovascular: Negative.   Gastrointestinal: Positive for abdominal pain. Negative for constipation, diarrhea, nausea and vomiting.  Genitourinary: Negative.   Neurological: Positive for headaches. Negative for dizziness, syncope and light-headedness.   Physical Exam   Vitals:   08/15/18 2145 08/15/18 2358  BP: 140/86 (!) 146/81  Pulse: 88 74  Resp: 18 18  Temp: 98.8 F (37.1 C)   Weight: 114.8 kg   Height: 5\' 6"  (1.676 m)    Physical Exam  Nursing note and vitals reviewed. Constitutional: She is oriented to person, place, and time. She appears well-nourished. No distress.  HENT:  Head: Normocephalic.  Cardiovascular: Normal rate, regular rhythm and normal heart sounds.  Respiratory: Effort normal and breath sounds normal. No respiratory distress. She has no wheezes.  GI: Soft. Bowel sounds are normal. She exhibits no distension. There is no tenderness. There is no rebound.  Genitourinary: Vaginal discharge found.  Genitourinary Comments: White thin discharge with foul odor present, cervix closed, negative  CMT, no abnormalities or mass palpated in adnexa, uterus non tender  Musculoskeletal: Normal range of motion. She exhibits no edema.  Neurological: She is alert and oriented to person, place, and time.  Psychiatric: She has a normal mood and affect. Her behavior is normal. Thought content normal.   MAU Course  Procedures  MDM Orders Placed This Encounter  Procedures  . Wet prep, genital  . US OB LESS THAN 14 WEEKS WITH OB TRANSVAGINAL  . Urinalysis, Routine w reflex microscopic  . CBC  . hCG, quantitative, pregnancy  . HIV Antibody (routine testing w rflx)  . Pregnancy, urine POC  . ABO/Rh   Meds ordered this encounter  Medications  . acetaminophen (TYLENOL) tablet 1,000 mg  . metroNIDAZOLE (FLAGYL) 500 MG tablet    Sig: Take 1 tablet (500 mg total) by mouth 2  (two) times daily.    Dispense:  14 tablet    Refill:  0    Order Specific Question:   Supervising Provider    Answer:   Reva Bores [[id number]]  . butalbital-acetaminophen-caffeine (FIORICET, ESGIC) 50-325-40 MG tablet    Sig: Take 1 tablet by mouth every 6 (six) hours as needed for headache.    Dispense:  10 tablet    Refill:  0    Order Specific Question:   Supervising Provider    Answer:   Reva Bores [[id number]]   Labs and [place] results reviewed:  Results for orders placed or performed during the hospital encounter of 08/15/18 (from the past 24 hour(s))  Urinalysis, Routine w reflex microscopic     Status: Abnormal   Collection Time: 08/15/18 10:07 PM  Result Value Ref Range   Color, Urine YELLOW YELLOW   APPearance HAZY (A) CLEAR   Specific Gravity, Urine 1.021 1.005 - 1.030   pH 5.0 5.0 - 8.0   Glucose, UA NEGATIVE NEGATIVE mg/dL   Hgb urine dipstick NEGATIVE NEGATIVE   Bilirubin Urine NEGATIVE NEGATIVE   Ketones, ur NEGATIVE NEGATIVE mg/dL   Protein, ur 161 (A) NEGATIVE mg/dL   Nitrite NEGATIVE NEGATIVE   Leukocytes, UA SMALL (A) NEGATIVE   RBC / HPF 0-5 0 - 5 RBC/hpf   WBC, UA 0-5 0 - 5 WBC/hpf   Bacteria, UA RARE (A) NONE SEEN   Squamous Epithelial / LPF 6-10 0 - 5   Mucus PRESENT   Pregnancy, urine POC     Status: Abnormal   Collection Time: 08/15/18 10:14 PM  Result Value Ref Range   Preg Test, Ur POSITIVE (A) NEGATIVE  CBC     Status: Abnormal   Collection Time: 08/15/18 10:21 PM  Result Value Ref Range   WBC 4.9 4.0 - 10.5 K/uL   RBC 5.17 (H) 3.87 - 5.11 MIL/uL   Hemoglobin 12.4 12.0 - 15.0 g/dL   HCT 09.6 04.5 - 40.9 %   MCV 75.0 (L) 78.0 - 100.0 fL   MCH 24.0 (L) 26.0 - 34.0 pg   MCHC 32.0 30.0 - 36.0 g/dL   RDW 81.1 (H) 91.4 - 78.2 %   Platelets 320 150 - 400 K/uL  ABO/Rh     Status: None   Collection Time: 08/15/18 10:21 PM  Result Value Ref Range   ABO/RH(D)      B POS Performed at [facility], [address], [place], [place] [address]   hCG,  quantitative, pregnancy     Status: Abnormal   Collection Time: 08/15/18 10:21 PM  Result Value Ref Range   hCG,  Beta Chain, Quant, S 1,646 (H) <5 mIU/mL  Wet prep, genital     Status: Abnormal   Collection Time: 08/15/18 10:37 PM  Result Value Ref Range   Yeast Wet Prep HPF POC NONE SEEN NONE SEEN   Trich, Wet Prep NONE SEEN NONE SEEN   Clue Cells Wet Prep HPF POC PRESENT (A) NONE SEEN   WBC, Wet Prep HPF POC MODERATE (A) NONE SEEN   Sperm NONE SEEN    US Ob Less Than 14 Weeks With Ob Transvaginal  Result Date: 08/15/2018 CLINICAL DATA:  Pregnant, cramping EXAM: OBSTETRIC <14 WK [place] AND TRANSVAGINAL OB US TECHNIQUE: Both transabdominal and transvaginal ultrasound examinations were performed for complete evaluation of the gestation as well as the maternal uterus, adnexal regions, and pelvic cul-de-sac. Transvaginal technique was performed to assess early pregnancy. COMPARISON:  None. FINDINGS: Intrauterine gestational sac: Probable single early gestational sac Yolk sac:  Not Visualized. Embryo:  Not Visualized. MSD: 2.3 mm   4 w   6  d Subchorionic hemorrhage:  None visualized. Maternal uterus/adnexae: Bilateral ovaries are within normal limits, noting a right corpus luteal cyst. No free fluid. IMPRESSION: Probable single early gestational sac, measuring 4 weeks 6 days by mean sac diameter. No yolk sac or fetal pole are visualized. Consider follow-up pelvic ultrasound in 14 days to confirm viability, as clinically warranted. Electronically Signed   By: Charline Bills M.D.   On: 08/15/2018 23:40   Wet prep positive for clue cells and moderate WBC- will treat for BV based on clinical symptoms  HCG 1,646 Urine culture pending  GC/C and HIV pending   Treatments in MAU included 1000mg  Tylenol for HA. Patient reports HA is relieved slightly and rates HA at 5/10 after treatment. Offered additional medication to patient to relieve HA but patient reports driving to hospital and not having anyone to  come pick her up.   Discussed results of lab work and [place] with patient. Educated on precautions to return to MAU. Verbalized importance of close follow up on Tuesday for repeat HCG. Patient verbalizes understanding and plans to receive prenatal care at [facility].  Consult with Dr Chestine Spore with assessment, hypertension and results. Recommends giving the patient HA cocktail for lingering migraine and okay to discharge home with follow up in the office on Tuesday or Wednesday for repeat lab work.   Rx for Fioricet given to home use due to patient driving to MAU. Follow up in the office on Tuesday and need to make appointment on Monday morning. Pt stable at time of discharge.  Assessment and Plan   1. Empty gestational sac with ongoing pregnancy   2. Abdominal pain during pregnancy in first trimester   3. [redacted] weeks gestation of pregnancy   4. Chronic tension-type headache, intractable   5. BV (bacterial vaginosis)   6. Maternal chronic hypertension in first trimester    Discharge home  Make appointment to be seen in the office for repeat HCG on Tuesday  Discussed reasons to return to MAU  Rx for Flagyl and Fioricet sent to pharmacy of choice   Sharyon Cable CNM 08/15/2018, 12:21 AM

## 2018-08-15 NOTE — MAU Note (Signed)
Pt reports headache since 9/13. Pt last took 1/2 ibuprofen last night with no relief. Pt reports cramping. LMP August 21. 2019. Pt came to [facility] because it is faster here and because she is a woman.

## 2018-08-16 LAB — HIV ANTIBODY (ROUTINE TESTING W REFLEX): HIV Screen 4th Generation wRfx: NONREACTIVE

## 2018-08-17 LAB — CULTURE, OB URINE

## 2018-08-17 LAB — GC/CHLAMYDIA PROBE AMP (~~LOC~~) NOT AT ARMC
Chlamydia: NEGATIVE
Neisseria Gonorrhea: NEGATIVE

## 2018-08-25 ENCOUNTER — Inpatient Hospital Stay (HOSPITAL_COMMUNITY)
Admission: AD | Admit: 2018-08-25 | Discharge: 2018-08-26 | Disposition: A | Discharge status: Home / Self Care | Payer: No Typology Code available for payment source | Source: Ambulatory Visit | Attending: Family Medicine | Admitting: Family Medicine

## 2018-08-25 ENCOUNTER — Inpatient Hospital Stay (HOSPITAL_COMMUNITY): Payer: No Typology Code available for payment source

## 2018-08-25 ENCOUNTER — Encounter (HOSPITAL_COMMUNITY): Payer: Self-pay | Admitting: *Deleted

## 2018-08-25 DIAGNOSIS — Z3491 Encounter for supervision of normal pregnancy, unspecified, first trimester: Secondary | ICD-10-CM

## 2018-08-25 DIAGNOSIS — Z3A01 Less than 8 weeks gestation of pregnancy: Secondary | ICD-10-CM | POA: Insufficient documentation

## 2018-08-25 DIAGNOSIS — O9989 Other specified diseases and conditions complicating pregnancy, childbirth and the puerperium: Secondary | ICD-10-CM | POA: Diagnosis not present

## 2018-08-25 DIAGNOSIS — R109 Unspecified abdominal pain: Secondary | ICD-10-CM | POA: Diagnosis not present

## 2018-08-25 DIAGNOSIS — O26891 Other specified pregnancy related conditions, first trimester: Secondary | ICD-10-CM | POA: Insufficient documentation
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
## 2018-08-26 DIAGNOSIS — R109 Unspecified abdominal pain: Secondary | ICD-10-CM

## 2018-08-26 DIAGNOSIS — Z3A01 Less than 8 weeks gestation of pregnancy: Secondary | ICD-10-CM | POA: Diagnosis not present

## 2018-08-26 DIAGNOSIS — O9989 Other specified diseases and conditions complicating pregnancy, childbirth and the puerperium: Secondary | ICD-10-CM | POA: Diagnosis not present

## 2018-08-26 NOTE — Discharge Instructions (Signed)
First Trimester of Pregnancy The first trimester of pregnancy is from week 1 until the end of week 13 (months 1 through 3). During this time, your baby will begin to develop inside you. At 6-8 weeks, the eyes and face are formed, and the heartbeat can be seen on ultrasound. At the end of 12 weeks, all the baby's organs are formed. Prenatal care is all the medical care you receive before the birth of your baby. Make sure you get good prenatal care and follow all of your doctor's instructions. Follow these instructions at home: Medicines  Take over-the-counter and prescription medicines only as told by your doctor. Some medicines are safe and some medicines are not safe during pregnancy.  Take a prenatal vitamin that contains at least 600 micrograms (mcg) of folic acid.  If you have trouble pooping (constipation), take medicine that will make your stool soft (stool softener) if your doctor approves. Eating and drinking  Eat regular, healthy meals.  Your doctor will tell you the amount of weight gain that is right for you.  Avoid raw meat and uncooked cheese.  If you feel sick to your stomach (nauseous) or throw up (vomit): ? Eat 4 or 5 small meals a day instead of 3 large meals. ? Try eating a few soda crackers. ? Drink liquids between meals instead of during meals.  To prevent constipation: ? Eat foods that are high in fiber, like fresh fruits and vegetables, whole grains, and beans. ? Drink enough fluids to keep your pee (urine) clear or pale yellow. Activity  Exercise only as told by your doctor. Stop exercising if you have cramps or pain in your lower belly (abdomen) or low back.  Do not exercise if it is too hot, too humid, or if you are in a place of great height (high altitude).  Try to avoid standing for long periods of time. Move your legs often if you must stand in one place for a long time.  Avoid heavy lifting.  Wear low-heeled shoes. Sit and stand up straight.  You  can have sex unless your doctor tells you not to. Relieving pain and discomfort  Wear a good support bra if your breasts are sore.  Take warm water baths (sitz baths) to soothe pain or discomfort caused by hemorrhoids. Use hemorrhoid cream if your doctor says it is okay.  Rest with your legs raised if you have leg cramps or low back pain.  If you have puffy, bulging veins (varicose veins) in your legs: ? Wear support hose or compression stockings as told by your doctor. ? Raise (elevate) your feet for 15 minutes, 3-4 times a day. ? Limit salt in your food. Prenatal care  Schedule your prenatal visits by the twelfth week of pregnancy.  Write down your questions. Take them to your prenatal visits.  Keep all your prenatal visits as told by your doctor. This is important. Safety  Wear your seat belt at all times when driving.  Make a list of emergency phone numbers. The list should include numbers for family, friends, the hospital, and police and fire departments. General instructions  Ask your doctor for a referral to a local prenatal class. Begin classes no later than at the start of month 6 of your pregnancy.  Ask for help if you need counseling or if you need help with nutrition. Your doctor can give you advice or tell you where to go for help.  Do not use hot tubs, steam rooms, or   saunas.  Do not douche or use tampons or scented sanitary pads.  Do not cross your legs for long periods of time.  Avoid all herbs and alcohol. Avoid drugs that are not approved by your doctor.  Do not use any tobacco products, including cigarettes, chewing tobacco, and electronic cigarettes. If you need help quitting, ask your doctor. You may get counseling or other support to help you quit.  Avoid cat litter boxes and soil used by cats. These carry germs that can cause birth defects in the baby and can cause a loss of your baby (miscarriage) or stillbirth.  Visit your dentist. At home, brush  your teeth with a soft toothbrush. Be gentle when you floss. Contact a doctor if:  You are dizzy.  You have mild cramps or pressure in your lower belly.  You have a nagging pain in your belly area.  You continue to feel sick to your stomach, you throw up, or you have watery poop (diarrhea).  You have a bad smelling fluid coming from your vagina.  You have pain when you pee (urinate).  You have increased puffiness (swelling) in your face, hands, legs, or ankles. Get help right away if:  You have a fever.  You are leaking fluid from your vagina.  You have spotting or bleeding from your vagina.  You have very bad belly cramping or pain.  You gain or lose weight rapidly.  You throw up blood. It may look like coffee grounds.  You are around people who have German measles, fifth disease, or chickenpox.  You have a very bad headache.  You have shortness of breath.  You have any kind of trauma, such as from a fall or a car accident. Summary  The first trimester of pregnancy is from week 1 until the end of week 13 (months 1 through 3).  To take care of yourself and your unborn baby, you will need to eat healthy meals, take medicines only if your doctor tells you to do so, and do activities that are safe for you and your baby.  Keep all follow-up visits as told by your doctor. This is important as your doctor will have to ensure that your baby is healthy and growing well. This information is not intended to replace advice given to you by your health care provider. Make sure you discuss any questions you have with your health care provider. Document Released: 04/22/2008 Document Revised: 11/12/2016 Document Reviewed: 11/12/2016 Elsevier Interactive Patient Education  2017 [employer]  
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
## 2018-09-10 ENCOUNTER — Other Ambulatory Visit: Payer: Self-pay | Admitting: *Deleted

## 2018-09-10 DIAGNOSIS — J45990 Exercise induced bronchospasm: Secondary | ICD-10-CM

## 2018-09-10 DIAGNOSIS — O3680X9 Pregnancy with inconclusive fetal viability, other fetus: Secondary | ICD-10-CM | POA: Diagnosis not present

## 2018-09-10 DIAGNOSIS — Z3480 Encounter for supervision of other normal pregnancy, unspecified trimester: Secondary | ICD-10-CM | POA: Diagnosis not present

## 2018-09-10 DIAGNOSIS — Z124 Encounter for screening for malignant neoplasm of cervix: Secondary | ICD-10-CM | POA: Diagnosis not present

## 2018-09-10 MED ORDER — ALBUTEROL SULFATE HFA 108 (90 BASE) MCG/ACT IN AERS: 1.0000 | INHALATION_SPRAY | RESPIRATORY_TRACT | 1 refills | Status: DC | PRN

## 2018-10-19 ENCOUNTER — Encounter: Payer: Self-pay | Admitting: Obstetrics and Gynecology

## 2018-10-19 ENCOUNTER — Other Ambulatory Visit: Payer: Self-pay | Admitting: Obstetrics and Gynecology

## 2018-10-19 ENCOUNTER — Ambulatory Visit (INDEPENDENT_AMBULATORY_CARE_PROVIDER_SITE_OTHER): Payer: Medicaid Other | Admitting: Obstetrics and Gynecology

## 2018-10-19 ENCOUNTER — Other Ambulatory Visit: Payer: Self-pay

## 2018-10-19 ENCOUNTER — Ambulatory Visit (HOSPITAL_COMMUNITY)
Admission: RE | Admit: 2018-10-19 | Discharge: 2018-10-19 | Disposition: A | Discharge status: Home / Self Care | Payer: Medicaid Other | Source: Ambulatory Visit | Attending: Obstetrics and Gynecology | Admitting: Obstetrics and Gynecology

## 2018-10-19 DIAGNOSIS — Z3480 Encounter for supervision of other normal pregnancy, unspecified trimester: Secondary | ICD-10-CM

## 2018-10-19 DIAGNOSIS — O09299 Supervision of pregnancy with other poor reproductive or obstetric history, unspecified trimester: Secondary | ICD-10-CM | POA: Insufficient documentation

## 2018-10-19 DIAGNOSIS — Z8759 Personal history of other complications of pregnancy, childbirth and the puerperium: Secondary | ICD-10-CM

## 2018-10-19 DIAGNOSIS — O09291 Supervision of pregnancy with other poor reproductive or obstetric history, first trimester: Secondary | ICD-10-CM | POA: Diagnosis not present

## 2018-10-19 DIAGNOSIS — Z3686 Encounter for antenatal screening for cervical length: Secondary | ICD-10-CM | POA: Diagnosis not present

## 2018-10-19 DIAGNOSIS — Z3A14 14 weeks gestation of pregnancy: Secondary | ICD-10-CM | POA: Diagnosis not present

## 2018-10-19 DIAGNOSIS — O0990 Supervision of high risk pregnancy, unspecified, unspecified trimester: Secondary | ICD-10-CM | POA: Insufficient documentation

## 2018-10-19 DIAGNOSIS — Z3481 Encounter for supervision of other normal pregnancy, first trimester: Secondary | ICD-10-CM | POA: Diagnosis not present

## 2018-10-19 MED ORDER — ASPIRIN EC 81 MG PO TBEC: 81.0000 mg | DELAYED_RELEASE_TABLET | Freq: Every day | ORAL | 2 refills | Status: DC

## 2018-10-19 NOTE — Patient Instructions (Signed)
 First Trimester of Pregnancy The first trimester of pregnancy is from week 1 until the end of week 13 (months 1 through 3). A week after a sperm fertilizes an egg, the egg will implant on the wall of the uterus. This embryo will begin to develop into a baby. Genes from you and your partner will form the baby. The female genes will determine whether the baby will be a boy or a girl. At 6-8 weeks, the eyes and face will be formed, and the heartbeat can be seen on ultrasound. At the end of 12 weeks, all the baby's organs will be formed. Now that you are pregnant, you will want to do everything you can to have a healthy baby. Two of the most important things are to get good prenatal care and to follow your health care provider's instructions. Prenatal care is all the medical care you receive before the baby's birth. This care will help prevent, find, and treat any problems during the pregnancy and childbirth. Body changes during your first trimester Your body goes through many changes during pregnancy. The changes vary from woman to woman.  You may gain or lose a couple of pounds at first.  You may feel sick to your stomach (nauseous) and you may throw up (vomit). If the vomiting is uncontrollable, call your health care provider.  You may tire easily.  You may develop headaches that can be relieved by medicines. All medicines should be approved by your health care provider.  You may urinate more often. Painful urination may mean you have a bladder infection.  You may develop heartburn as a result of your pregnancy.  You may develop constipation because certain hormones are causing the muscles that push stool through your intestines to slow down.  You may develop hemorrhoids or swollen veins (varicose veins).  Your breasts may begin to grow larger and become tender. Your nipples may stick out more, and the tissue that surrounds them (areola) may become darker.  Your gums may bleed and may be  sensitive to brushing and flossing.  Dark spots or blotches (chloasma, mask of pregnancy) may develop on your face. This will likely fade after the baby is born.  Your menstrual periods will stop.  You may have a loss of appetite.  You may develop cravings for certain kinds of food.  You may have changes in your emotions from day to day, such as being excited to be pregnant or being concerned that something may go wrong with the pregnancy and baby.  You may have more vivid and strange dreams.  You may have changes in your hair. These can include thickening of your hair, rapid growth, and changes in texture. Some women also have hair loss during or after pregnancy, or hair that feels dry or thin. Your hair will most likely return to normal after your baby is born.  What to expect at prenatal visits During a routine prenatal visit:  You will be weighed to make sure you and the baby are growing normally.  Your blood pressure will be taken.  Your abdomen will be measured to track your baby's growth.  The fetal heartbeat will be listened to between weeks 10 and 14 of your pregnancy.  Test results from any previous visits will be discussed.  Your health care provider may ask you:  How you are feeling.  If you are feeling the baby move.  If you have had any abnormal symptoms, such as leaking fluid, bleeding, severe   headaches, or abdominal cramping.  If you are using any tobacco products, including cigarettes, chewing tobacco, and electronic cigarettes.  If you have any questions.  Other tests that may be performed during your first trimester include:  Blood tests to find your blood type and to check for the presence of any previous infections. The tests will also be used to check for low iron levels (anemia) and protein on red blood cells (Rh antibodies). Depending on your risk factors, or if you previously had diabetes during pregnancy, you may have tests to check for high blood  sugar that affects pregnant women (gestational diabetes).  Urine tests to check for infections, diabetes, or protein in the urine.  An ultrasound to confirm the proper growth and development of the baby.  Fetal screens for spinal cord problems (spina bifida) and Down syndrome.  HIV (human immunodeficiency virus) testing. Routine prenatal testing includes screening for HIV, unless you choose not to have this test.  You may need other tests to make sure you and the baby are doing well.  Follow these instructions at home: Medicines  Follow your health care provider's instructions regarding medicine use. Specific medicines may be either safe or unsafe to take during pregnancy.  Take a prenatal vitamin that contains at least 600 micrograms (mcg) of folic acid.  If you develop constipation, try taking a stool softener if your health care provider approves. Eating and drinking  Eat a balanced diet that includes fresh fruits and vegetables, whole grains, good sources of protein such as meat, eggs, or tofu, and low-fat dairy. Your health care provider will help you determine the amount of weight gain that is right for you.  Avoid raw meat and uncooked cheese. These carry germs that can cause birth defects in the baby.  Eating four or five small meals rather than three large meals a day may help relieve nausea and vomiting. If you start to feel nauseous, eating a few soda crackers can be helpful. Drinking liquids between meals, instead of during meals, also seems to help ease nausea and vomiting.  Limit foods that are high in fat and processed sugars, such as fried and sweet foods.  To prevent constipation: ? Eat foods that are high in fiber, such as fresh fruits and vegetables, whole grains, and beans. ? Drink enough fluid to keep your urine clear or pale yellow. Activity  Exercise only as directed by your health care provider. Most women can continue their usual exercise routine during  pregnancy. Try to exercise for 30 minutes at least 5 days a week. Exercising will help you: ? Control your weight. ? Stay in shape. ? Be prepared for labor and delivery.  Experiencing pain or cramping in the lower abdomen or lower back is a good sign that you should stop exercising. Check with your health care provider before continuing with normal exercises.  Try to avoid standing for long periods of time. Move your legs often if you must stand in one place for a long time.  Avoid heavy lifting.  Wear low-heeled shoes and practice good posture.  You may continue to have sex unless your health care provider tells you not to. Relieving pain and discomfort  Wear a good support bra to relieve breast tenderness.  Take warm sitz baths to soothe any pain or discomfort caused by hemorrhoids. Use hemorrhoid cream if your health care provider approves.  Rest with your legs elevated if you have leg cramps or low back pain.  If you   develop varicose veins in your legs, wear support hose. Elevate your feet for 15 minutes, 3-4 times a day. Limit salt in your diet. Prenatal care  Schedule your prenatal visits by the twelfth week of pregnancy. They are usually scheduled monthly at first, then more often in the last 2 months before delivery.  Write down your questions. Take them to your prenatal visits.  Keep all your prenatal visits as told by your health care provider. This is important. Safety  Wear your seat belt at all times when driving.  Make a list of emergency phone numbers, including numbers for family, friends, the hospital, and police and fire departments. General instructions  Ask your health care provider for a referral to a local prenatal education class. Begin classes no later than the beginning of month 6 of your pregnancy.  Ask for help if you have counseling or nutritional needs during pregnancy. Your health care provider can offer advice or refer you to specialists for help  with various needs.  Do not use hot tubs, steam rooms, or saunas.  Do not douche or use tampons or scented sanitary pads.  Do not cross your legs for long periods of time.  Avoid cat litter boxes and soil used by cats. These carry germs that can cause birth defects in the baby and possibly loss of the fetus by miscarriage or stillbirth.  Avoid all smoking, herbs, alcohol, and medicines not prescribed by your health care provider. Chemicals in these products affect the formation and growth of the baby.  Do not use any products that contain nicotine or tobacco, such as cigarettes and e-cigarettes. If you need help quitting, ask your health care provider. You may receive counseling support and other resources to help you quit.  Schedule a dentist appointment. At home, brush your teeth with a soft toothbrush and be gentle when you floss. Contact a health care provider if:  You have dizziness.  You have mild pelvic cramps, pelvic pressure, or nagging pain in the abdominal area.  You have persistent nausea, vomiting, or diarrhea.  You have a bad smelling vaginal discharge.  You have pain when you urinate.  You notice increased swelling in your face, hands, legs, or ankles.  You are exposed to fifth disease or chickenpox.  You are exposed to German measles (rubella) and have never had it. Get help right away if:  You have a fever.  You are leaking fluid from your vagina.  You have spotting or bleeding from your vagina.  You have severe abdominal cramping or pain.  You have rapid weight gain or loss.  You vomit blood or material that looks like coffee grounds.  You develop a severe headache.  You have shortness of breath.  You have any kind of trauma, such as from a fall or a car accident. Summary  The first trimester of pregnancy is from week 1 until the end of week 13 (months 1 through 3).  Your body goes through many changes during pregnancy. The changes vary from  woman to woman.  You will have routine prenatal visits. During those visits, your health care provider will examine you, discuss any test results you may have, and talk with you about how you are feeling. This information is not intended to replace advice given to you by your health care provider. Make sure you discuss any questions you have with your health care provider. Document Released: 10/29/2001 Document Revised: 10/16/2016 Document Reviewed: 10/16/2016 [employer] Interactive Patient Education  2018   [employer]   Second Trimester of Pregnancy The second trimester is from week 14 through week 27 (months 4 through 6). The second trimester is often a time when you feel your best. Your body has adjusted to being pregnant, and you begin to feel better physically. Usually, morning sickness has lessened or quit completely, you may have more energy, and you may have an increase in appetite. The second trimester is also a time when the fetus is growing rapidly. At the end of the sixth month, the fetus is about 9 inches long and weighs about 1 pounds. You will likely begin to feel the baby move (quickening) between 16 and 20 weeks of pregnancy. Body changes during your second trimester Your body continues to go through many changes during your second trimester. The changes vary from woman to woman.  Your weight will continue to increase. You will notice your lower abdomen bulging out.  You may begin to get stretch marks on your hips, abdomen, and breasts.  You may develop headaches that can be relieved by medicines. The medicines should be approved by your health care provider.  You may urinate more often because the fetus is pressing on your bladder.  You may develop or continue to have heartburn as a result of your pregnancy.  You may develop constipation because certain hormones are causing the muscles that push waste through your intestines to slow down.  You may develop hemorrhoids or  swollen, bulging veins (varicose veins).  You may have back pain. This is caused by: ? Weight gain. ? Pregnancy hormones that are relaxing the joints in your pelvis. ? A shift in weight and the muscles that support your balance.  Your breasts will continue to grow and they will continue to become tender.  Your gums may bleed and may be sensitive to brushing and flossing.  Dark spots or blotches (chloasma, mask of pregnancy) may develop on your face. This will likely fade after the baby is born.  A dark line from your belly button to the pubic area (linea nigra) may appear. This will likely fade after the baby is born.  You may have changes in your hair. These can include thickening of your hair, rapid growth, and changes in texture. Some women also have hair loss during or after pregnancy, or hair that feels dry or thin. Your hair will most likely return to normal after your baby is born.  What to expect at prenatal visits During a routine prenatal visit:  You will be weighed to make sure you and the fetus are growing normally.  Your blood pressure will be taken.  Your abdomen will be measured to track your baby's growth.  The fetal heartbeat will be listened to.  Any test results from the previous visit will be discussed.  Your health care provider may ask you:  How you are feeling.  If you are feeling the baby move.  If you have had any abnormal symptoms, such as leaking fluid, bleeding, severe headaches, or abdominal cramping.  If you are using any tobacco products, including cigarettes, chewing tobacco, and electronic cigarettes.  If you have any questions.  Other tests that may be performed during your second trimester include:  Blood tests that check for: ? Low iron levels (anemia). ? High blood sugar that affects pregnant women (gestational diabetes) between 24 and 28 weeks. ? Rh antibodies. This is to check for a protein on red blood cells (Rh factor).  Urine  tests to   check for infections, diabetes, or protein in the urine.  An ultrasound to confirm the proper growth and development of the baby.  An amniocentesis to check for possible genetic problems.  Fetal screens for spina bifida and Down syndrome.  HIV (human immunodeficiency virus) testing. Routine prenatal testing includes screening for HIV, unless you choose not to have this test.  Follow these instructions at home: Medicines  Follow your health care provider's instructions regarding medicine use. Specific medicines may be either safe or unsafe to take during pregnancy.  Take a prenatal vitamin that contains at least 600 micrograms (mcg) of folic acid.  If you develop constipation, try taking a stool softener if your health care provider approves. Eating and drinking  Eat a balanced diet that includes fresh fruits and vegetables, whole grains, good sources of protein such as meat, eggs, or tofu, and low-fat dairy. Your health care provider will help you determine the amount of weight gain that is right for you.  Avoid raw meat and uncooked cheese. These carry germs that can cause birth defects in the baby.  If you have low calcium intake from food, talk to your health care provider about whether you should take a daily calcium supplement.  Limit foods that are high in fat and processed sugars, such as fried and sweet foods.  To prevent constipation: ? Drink enough fluid to keep your urine clear or pale yellow. ? Eat foods that are high in fiber, such as fresh fruits and vegetables, whole grains, and beans. Activity  Exercise only as directed by your health care provider. Most women can continue their usual exercise routine during pregnancy. Try to exercise for 30 minutes at least 5 days a week. Stop exercising if you experience uterine contractions.  Avoid heavy lifting, wear low heel shoes, and practice good posture.  A sexual relationship may be continued unless your health  care provider directs you otherwise. Relieving pain and discomfort  Wear a good support bra to prevent discomfort from breast tenderness.  Take warm sitz baths to soothe any pain or discomfort caused by hemorrhoids. Use hemorrhoid cream if your health care provider approves.  Rest with your legs elevated if you have leg cramps or low back pain.  If you develop varicose veins, wear support hose. Elevate your feet for 15 minutes, 3-4 times a day. Limit salt in your diet. Prenatal Care  Write down your questions. Take them to your prenatal visits.  Keep all your prenatal visits as told by your health care provider. This is important. Safety  Wear your seat belt at all times when driving.  Make a list of emergency phone numbers, including numbers for family, friends, the hospital, and police and fire departments. General instructions  Ask your health care provider for a referral to a local prenatal education class. Begin classes no later than the beginning of month 6 of your pregnancy.  Ask for help if you have counseling or nutritional needs during pregnancy. Your health care provider can offer advice or refer you to specialists for help with various needs.  Do not use hot tubs, steam rooms, or saunas.  Do not douche or use tampons or scented sanitary pads.  Do not cross your legs for long periods of time.  Avoid cat litter boxes and soil used by cats. These carry germs that can cause birth defects in the baby and possibly loss of the fetus by miscarriage or stillbirth.  Avoid all smoking, herbs, alcohol, and unprescribed drugs. Chemicals   in these products can affect the formation and growth of the baby.  Do not use any products that contain nicotine or tobacco, such as cigarettes and e-cigarettes. If you need help quitting, ask your health care provider.  Visit your dentist if you have not gone yet during your pregnancy. Use a soft toothbrush to brush your teeth and be gentle when  you floss. Contact a health care provider if:  You have dizziness.  You have mild pelvic cramps, pelvic pressure, or nagging pain in the abdominal area.  You have persistent nausea, vomiting, or diarrhea.  You have a bad smelling vaginal discharge.  You have pain when you urinate. Get help right away if:  You have a fever.  You are leaking fluid from your vagina.  You have spotting or bleeding from your vagina.  You have severe abdominal cramping or pain.  You have rapid weight gain or weight loss.  You have shortness of breath with chest pain.  You notice sudden or extreme swelling of your face, hands, ankles, feet, or legs.  You have not felt your baby move in over an hour.  You have severe headaches that do not go away when you take medicine.  You have vision changes. Summary  The second trimester is from week 14 through week 27 (months 4 through 6). It is also a time when the fetus is growing rapidly.  Your body goes through many changes during pregnancy. The changes vary from woman to woman.  Avoid all smoking, herbs, alcohol, and unprescribed drugs. These chemicals affect the formation and growth your baby.  Do not use any tobacco products, such as cigarettes, chewing tobacco, and e-cigarettes. If you need help quitting, ask your health care provider.  Contact your health care provider if you have any questions. Keep all prenatal visits as told by your health care provider. This is important. This information is not intended to replace advice given to you by your health care provider. Make sure you discuss any questions you have with your health care provider. Document Released: 10/29/2001 Document Revised: 12/10/2016 Document Reviewed: 12/10/2016 [employer] Interactive Patient Education  2018 [employer]   Contraception Choices Contraception, also called birth control, refers to methods or devices that prevent pregnancy. Hormonal methods Contraceptive  implant A contraceptive implant is a thin, plastic tube that contains a hormone. It is inserted into the upper part of the arm. It can remain in place for up to 3 years. Progestin-only injections Progestin-only injections are injections of progestin, a synthetic form of the hormone progesterone. They are given every 3 months by a health care provider. Birth control pills Birth control pills are pills that contain hormones that prevent pregnancy. They must be taken once a day, preferably at the same time each day. Birth control patch The birth control patch contains hormones that prevent pregnancy. It is placed on the skin and must be changed once a week for three weeks and removed on the fourth week. A prescription is needed to use this method of contraception. Vaginal ring A vaginal ring contains hormones that prevent pregnancy. It is placed in the vagina for three weeks and removed on the fourth week. After that, the process is repeated with a new ring. A prescription is needed to use this method of contraception. Emergency contraceptive Emergency contraceptives prevent pregnancy after unprotected sex. They come in pill form and can be taken up to 5 days after sex. They work best the sooner they are taken after having   sex. Most emergency contraceptives are available without a prescription. This method should not be used as your only form of birth control. Barrier methods Female condom A female condom is a thin sheath that is worn over the penis during sex. Condoms keep sperm from going inside a woman's body. They can be used with a spermicide to increase their effectiveness. They should be disposed after a single use. Female condom A female condom is a soft, loose-fitting sheath that is put into the vagina before sex. The condom keeps sperm from going inside a woman's body. They should be disposed after a single use. Diaphragm A diaphragm is a soft, dome-shaped barrier. It is inserted into the vagina  before sex, along with a spermicide. The diaphragm blocks sperm from entering the uterus, and the spermicide kills sperm. A diaphragm should be left in the vagina for 6-8 hours after sex and removed within 24 hours. A diaphragm is prescribed and fitted by a health care provider. A diaphragm should be replaced every 1-2 years, after giving birth, after gaining more than 15 lb (6.8 kg), and after pelvic surgery. Cervical cap A cervical cap is a round, soft latex or plastic cup that fits over the cervix. It is inserted into the vagina before sex, along with spermicide. It blocks sperm from entering the uterus. The cap should be left in place for 6-8 hours after sex and removed within 48 hours. A cervical cap must be prescribed and fitted by a health care provider. It should be replaced every 2 years. Sponge A sponge is a soft, circular piece of polyurethane foam with spermicide on it. The sponge helps block sperm from entering the uterus, and the spermicide kills sperm. To use it, you make it wet and then insert it into the vagina. It should be inserted before sex, left in for at least 6 hours after sex, and removed and thrown away within 30 hours. Spermicides Spermicides are chemicals that kill or block sperm from entering the cervix and uterus. They can come as a cream, jelly, suppository, foam, or tablet. A spermicide should be inserted into the vagina with an applicator at least 10-15 minutes before sex to allow time for it to work. The process must be repeated every time you have sex. Spermicides do not require a prescription. Intrauterine contraception Intrauterine device (IUD) An IUD is a T-shaped device that is put in a woman's uterus. There are two types:  Hormone IUD.This type contains progestin, a synthetic form of the hormone progesterone. This type can stay in place for 3-5 years.  Copper IUD.This type is wrapped in copper wire. It can stay in place for 10 years.  Permanent methods of  contraception Female tubal ligation In this method, a woman's fallopian tubes are sealed, tied, or blocked during surgery to prevent eggs from traveling to the uterus. Hysteroscopic sterilization In this method, a small, flexible insert is placed into each fallopian tube. The inserts cause scar tissue to form in the fallopian tubes and block them, so sperm cannot reach an egg. The procedure takes about 3 months to be effective. Another form of birth control must be used during those 3 months. Female sterilization This is a procedure to tie off the tubes that carry sperm (vasectomy). After the procedure, the man can still ejaculate fluid (semen). Natural planning methods Natural family planning In this method, a couple does not have sex on days when the woman could become pregnant. Calendar method This means keeping track of the   length of each menstrual cycle, identifying the days when pregnancy can happen, and not having sex on those days. Ovulation method In this method, a couple avoids sex during ovulation. Symptothermal method This method involves not having sex during ovulation. The woman typically checks for ovulation by watching changes in her temperature and in the consistency of cervical mucus. Post-ovulation method In this method, a couple waits to have sex until after ovulation. Summary  Contraception, also called birth control, means methods or devices that prevent pregnancy.  Hormonal methods of contraception include implants, injections, pills, patches, vaginal rings, and emergency contraceptives.  Barrier methods of contraception can include female condoms, female condoms, diaphragms, cervical caps, sponges, and spermicides.  There are two types of IUDs (intrauterine devices). An IUD can be put in a woman's uterus to prevent pregnancy for 3-5 years.  Permanent sterilization can be done through a procedure for males, females, or both.  Natural family planning methods involve  not having sex on days when the woman could become pregnant. This information is not intended to replace advice given to you by your health care provider. Make sure you discuss any questions you have with your health care provider. Document Released: 11/04/2005 Document Revised: 12/07/2016 Document Reviewed: 12/07/2016 Elsevier Interactive Patient Education  2018 [employer]   Breastfeeding Choosing to breastfeed is one of the best decisions you can make for yourself and your baby. A change in hormones during pregnancy causes your breasts to make breast milk in your milk-producing glands. Hormones prevent breast milk from being released before your baby is born. They also prompt milk flow after birth. Once breastfeeding has begun, thoughts of your baby, as well as his or her sucking or crying, can stimulate the release of milk from your milk-producing glands. Benefits of breastfeeding Research shows that breastfeeding offers many health benefits for infants and mothers. It also offers a cost-free and convenient way to feed your baby. For your baby  Your first milk (colostrum) helps your baby's digestive system to function better.  Special cells in your milk (antibodies) help your baby to fight off infections.  Breastfed babies are less likely to develop asthma, allergies, obesity, or type 2 diabetes. They are also at lower risk for sudden infant death syndrome (SIDS).  Nutrients in breast milk are better able to meet your baby's needs compared to infant formula.  Breast milk improves your baby's brain development. For you  Breastfeeding helps to create a very special bond between you and your baby.  Breastfeeding is convenient. Breast milk costs nothing and is always available at the correct temperature.  Breastfeeding helps to burn calories. It helps you to lose the weight that you gained during pregnancy.  Breastfeeding makes your uterus return faster to its size before pregnancy.  It also slows bleeding (lochia) after you give birth.  Breastfeeding helps to lower your risk of developing type 2 diabetes, osteoporosis, rheumatoid arthritis, cardiovascular disease, and breast, ovarian, uterine, and endometrial cancer later in life. Breastfeeding basics Starting breastfeeding  Find a comfortable place to sit or lie down, with your neck and back well-supported.  Place a pillow or a rolled-up blanket under your baby to bring him or her to the level of your breast (if you are seated). Nursing pillows are specially designed to help support your arms and your baby while you breastfeed.  Make sure that your baby's tummy (abdomen) is facing your abdomen.  Gently massage your breast. With your fingertips, massage from the outer edges of   your breast inward toward the nipple. This encourages milk flow. If your milk flows slowly, you may need to continue this action during the feeding.  Support your breast with 4 fingers underneath and your thumb above your nipple (make the letter "C" with your hand). Make sure your fingers are well away from your nipple and your baby's mouth.  Stroke your baby's lips gently with your finger or nipple.  When your baby's mouth is open wide enough, quickly bring your baby to your breast, placing your entire nipple and as much of the areola as possible into your baby's mouth. The areola is the colored area around your nipple. ? More areola should be visible above your baby's upper lip than below the lower lip. ? Your baby's lips should be opened and extended outward (flanged) to ensure an adequate, comfortable latch. ? Your baby's tongue should be between his or her lower gum and your breast.  Make sure that your baby's mouth is correctly positioned around your nipple (latched). Your baby's lips should create a seal on your breast and be turned out (everted).  It is common for your baby to suck about 2-3 minutes in order to start the flow of breast  milk. Latching Teaching your baby how to latch onto your breast properly is very important. An improper latch can cause nipple pain, decreased milk supply, and poor weight gain in your baby. Also, if your baby is not latched onto your nipple properly, he or she may swallow some air during feeding. This can make your baby fussy. Burping your baby when you switch breasts during the feeding can help to get rid of the air. However, teaching your baby to latch on properly is still the best way to prevent fussiness from swallowing air while breastfeeding. Signs that your baby has successfully latched onto your nipple  Silent tugging or silent sucking, without causing you pain. Infant's lips should be extended outward (flanged).  Swallowing heard between every 3-4 sucks once your milk has started to flow (after your let-down milk reflex occurs).  Muscle movement above and in front of his or her ears while sucking.  Signs that your baby has not successfully latched onto your nipple  Sucking sounds or smacking sounds from your baby while breastfeeding.  Nipple pain.  If you think your baby has not latched on correctly, slip your finger into the corner of your baby's mouth to break the suction and place it between your baby's gums. Attempt to start breastfeeding again. Signs of successful breastfeeding Signs from your baby  Your baby will gradually decrease the number of sucks or will completely stop sucking.  Your baby will fall asleep.  Your baby's body will relax.  Your baby will retain a small amount of milk in his or her mouth.  Your baby will let go of your breast by himself or herself.  Signs from you  Breasts that have increased in firmness, weight, and size 1-3 hours after feeding.  Breasts that are softer immediately after breastfeeding.  Increased milk volume, as well as a change in milk consistency and color by the fifth day of breastfeeding.  Nipples that are not sore,  cracked, or bleeding.  Signs that your baby is getting enough milk  Wetting at least 1-2 diapers during the first 24 hours after birth.  Wetting at least 5-6 diapers every 24 hours for the first week after birth. The urine should be clear or pale yellow by the age of 5   days.  Wetting 6-8 diapers every 24 hours as your baby continues to grow and develop.  At least 3 stools in a 24-hour period by the age of 5 days. The stool should be soft and yellow.  At least 3 stools in a 24-hour period by the age of 7 days. The stool should be seedy and yellow.  No loss of weight greater than 10% of birth weight during the first 3 days of life.  Average weight gain of 4-7 oz (113-198 g) per week after the age of 4 days.  Consistent daily weight gain by the age of 5 days, without weight loss after the age of 2 weeks. After a feeding, your baby may spit up a small amount of milk. This is normal. Breastfeeding frequency and duration Frequent feeding will help you make more milk and can prevent sore nipples and extremely full breasts (breast engorgement). Breastfeed when you feel the need to reduce the fullness of your breasts or when your baby shows signs of hunger. This is called "breastfeeding on demand." Signs that your baby is hungry include:  Increased alertness, activity, or restlessness.  Movement of the head from side to side.  Opening of the mouth when the corner of the mouth or cheek is stroked (rooting).  Increased sucking sounds, smacking lips, cooing, sighing, or squeaking.  Hand-to-mouth movements and sucking on fingers or hands.  Fussing or crying.  Avoid introducing a pacifier to your baby in the first 4-6 weeks after your baby is born. After this time, you may choose to use a pacifier. Research has shown that pacifier use during the first year of a baby's life decreases the risk of sudden infant death syndrome (SIDS). Allow your baby to feed on each breast as long as he or she  wants. When your baby unlatches or falls asleep while feeding from the first breast, offer the second breast. Because newborns are often sleepy in the first few weeks of life, you may need to awaken your baby to get him or her to feed. Breastfeeding times will vary from baby to baby. However, the following rules can serve as a guide to help you make sure that your baby is properly fed:  Newborns (babies 4 weeks of age or younger) may breastfeed every 1-3 hours.  Newborns should not go without breastfeeding for longer than 3 hours during the day or 5 hours during the night.  You should breastfeed your baby a minimum of 8 times in a 24-hour period.  Breast milk pumping Pumping and storing breast milk allows you to make sure that your baby is exclusively fed your breast milk, even at times when you are unable to breastfeed. This is especially important if you go back to work while you are still breastfeeding, or if you are not able to be present during feedings. Your lactation consultant can help you find a method of pumping that works best for you and give you guidelines about how long it is safe to store breast milk. Caring for your breasts while you breastfeed Nipples can become dry, cracked, and sore while breastfeeding. The following recommendations can help keep your breasts moisturized and healthy:  Avoid using soap on your nipples.  Wear a supportive bra designed especially for nursing. Avoid wearing underwire-style bras or extremely tight bras (sports bras).  Air-dry your nipples for 3-4 minutes after each feeding.  Use only cotton bra pads to absorb leaked breast milk. Leaking of breast milk between feedings is normal.    Use lanolin on your nipples after breastfeeding. Lanolin helps to maintain your skin's normal moisture barrier. Pure lanolin is not harmful (not toxic) to your baby. You may also hand express a few drops of breast milk and gently massage that milk into your nipples and  allow the milk to air-dry.  In the first few weeks after giving birth, some women experience breast engorgement. Engorgement can make your breasts feel heavy, warm, and tender to the touch. Engorgement peaks within 3-5 days after you give birth. The following recommendations can help to ease engorgement:  Completely empty your breasts while breastfeeding or pumping. You may want to start by applying warm, moist heat (in the shower or with warm, water-soaked hand towels) just before feeding or pumping. This increases circulation and helps the milk flow. If your baby does not completely empty your breasts while breastfeeding, pump any extra milk after he or she is finished.  Apply ice packs to your breasts immediately after breastfeeding or pumping, unless this is too uncomfortable for you. To do this: ? Put ice in a plastic bag. ? Place a towel between your skin and the bag. ? Leave the ice on for 20 minutes, 2-3 times a day.  Make sure that your baby is latched on and positioned properly while breastfeeding.  If engorgement persists after 48 hours of following these recommendations, contact your health care provider or a lactation consultant. Overall health care recommendations while breastfeeding  Eat 3 healthy meals and 3 snacks every day. Well-nourished mothers who are breastfeeding need an additional 450-500 calories a day. You can meet this requirement by increasing the amount of a balanced diet that you eat.  Drink enough water to keep your urine pale yellow or clear.  Rest often, relax, and continue to take your prenatal vitamins to prevent fatigue, stress, and low vitamin and mineral levels in your body (nutrient deficiencies).  Do not use any products that contain nicotine or tobacco, such as cigarettes and e-cigarettes. Your baby may be harmed by chemicals from cigarettes that pass into breast milk and exposure to secondhand smoke. If you need help quitting, ask your health care  provider.  Avoid alcohol.  Do not use illegal drugs or marijuana.  Talk with your health care provider before taking any medicines. These include over-the-counter and prescription medicines as well as vitamins and herbal supplements. Some medicines that may be harmful to your baby can pass through breast milk.  It is possible to become pregnant while breastfeeding. If birth control is desired, ask your health care provider about options that will be safe while breastfeeding your baby. Where to find more information: [employer]: www.llli.org Contact a health care provider if:  You feel like you want to stop breastfeeding or have become frustrated with breastfeeding.  Your nipples are cracked or bleeding.  Your breasts are red, tender, or warm.  You have: ? Painful breasts or nipples. ? A swollen area on either breast. ? A fever or chills. ? Nausea or vomiting. ? Drainage other than breast milk from your nipples.  Your breasts do not become full before feedings by the fifth day after you give birth.  You feel sad and depressed.  Your baby is: ? Too sleepy to eat well. ? Having trouble sleeping. ? More than 1 week old and wetting fewer than 6 diapers in a 24-hour period. ? Not gaining weight by 5 days of age.  Your baby has fewer than 3 stools in a   24-hour period.  Your baby's skin or the white parts of his or her eyes become yellow. Get help right away if:  Your baby is overly tired (lethargic) and does not want to wake up and feed.  Your baby develops an unexplained fever. Summary  Breastfeeding offers many health benefits for infant and mothers.  Try to breastfeed your infant when he or she shows early signs of hunger.  Gently tickle or stroke your baby's lips with your finger or nipple to allow the baby to open his or her mouth. Bring the baby to your breast. Make sure that much of the areola is in your baby's mouth. Offer one side and burp the  baby before you offer the other side.  Talk with your health care provider or lactation consultant if you have questions or you face problems as you breastfeed. This information is not intended to replace advice given to you by your health care provider. Make sure you discuss any questions you have with your health care provider. Document Released: 11/04/2005 Document Revised: 12/06/2016 Document Reviewed: 12/06/2016 Elsevier Interactive Patient Education  2018 [employer] Inc.  

## 2018-10-19 NOTE — Progress Notes (Signed)
NOB, declined FLU.

## 2018-10-19 NOTE — Progress Notes (Signed)
Subjective:    Denise [name] is a G2P1001 [redacted]w[redacted]d being seen today for her first obstetrical visit.  Her obstetrical history is significant for previous pregnancy complicated by short cervix, history of gestational hypertension with delivery at 37 weeks. Patient does intend to breast feed. Pregnancy history fully reviewed. Patient reports initial ob appointment with [facility] in September where blood work and pap smear were collected. Records requested  Patient reports no complaints.  Vitals:   10/19/18 1423  BP: 127/87  Pulse: 84  Temp: (!) 97.5 F (36.4 C)  Weight: 255 lb 11.2 oz (116 kg)    HISTORY: OB History  Gravida Para Term Preterm AB Living  2 1 1     1   SAB TAB Ectopic Multiple Live Births          1    # Outcome Date GA Lbr Len/2nd Weight Sex Delivery Anes PTL Lv  2 Current           1 Term 10/28/13 [redacted]w[redacted]d 05:35 / 00:19 6 lb 5.6 oz (2.88 kg) F Vag-Spont None  LIV     Birth Comments: Newborn Screen: Normal, Hgb, FA  NBS Barcode: [id number]   Past Medical History:  Diagnosis Date  . Allergy   . Arthritis   . Asthma   . Hx of chlamydia infection 2017  . Pregnancy induced hypertension    Pre-E  . Seasonal allergies   . Trichomonas 2017   Past Surgical History:  Procedure Laterality Date  . NO PAST SURGERIES     Family History  Problem Relation Age of Onset  . Arthritis Mother   . Diabetes Mother   . Diabetes Maternal Grandmother   . Arthritis Maternal Grandmother      Exam    Uterus:   14-weeks  Pelvic Exam:    Perineum: No Hemorrhoids, Normal Perineum   Vulva: normal   Vagina:  normal mucosa, normal discharge   pH:    Cervix: multiparous appearance and cervix is closed and long   Adnexa: normal adnexa and no mass, fullness, tenderness   Bony Pelvis: gynecoid  System: Breast:  normal appearance, no masses or tenderness   Skin: normal coloration and turgor, no rashes    Neurologic: oriented, no focal deficits   Extremities: normal  strength, tone, and muscle mass   HEENT extra ocular movement intact   Mouth/Teeth mucous membranes moist, pharynx normal without lesions and dental hygiene good   Neck supple and no masses   Cardiovascular: regular rate and rhythm   Respiratory:  appears well, vitals normal, no respiratory distress, acyanotic, normal RR, chest clear, no wheezing, crepitations, rhonchi, normal symmetric air entry   Abdomen: soft, non-tender; bowel sounds normal; no masses,  no organomegaly   Urinary:       Assessment:    Pregnancy: G2P1001 Patient Active Problem List   Diagnosis Date Noted  . Supervision of other normal pregnancy, antepartum 10/19/2018  . History of prior pregnancy with short cervix, currently pregnant 10/19/2018  . History of gestational hypertension 01/26/2018  . 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 Asthma 04/29/2012        Plan:     Initial labs drawn. Prenatal vitamins. Problem list reviewed and updated. Genetic Screening discussed : panorama discussed. Patient is interested Patient reports elevated A1C in September and instructed to come in for early glucola with [facility]. Patient will return for 2 hour glucola and panorama test Transvaginal ultrasound ordered today to assess cervical lenght.  Ultrasound discussed; fetal survey: ordered.  Follow up in 4 weeks. 50% of 30 min visit spent on counseling and coordination of care.     Denise [name] 10/19/2018

## 2018-10-20 ENCOUNTER — Other Ambulatory Visit (HOSPITAL_COMMUNITY): Payer: Self-pay | Admitting: *Deleted

## 2018-10-20 DIAGNOSIS — O09299 Supervision of pregnancy with other poor reproductive or obstetric history, unspecified trimester: Secondary | ICD-10-CM

## 2018-10-21 ENCOUNTER — Other Ambulatory Visit: Payer: Medicaid Other

## 2018-10-21 DIAGNOSIS — Z3480 Encounter for supervision of other normal pregnancy, unspecified trimester: Secondary | ICD-10-CM | POA: Diagnosis not present

## 2018-10-21 DIAGNOSIS — O24419 Gestational diabetes mellitus in pregnancy, unspecified control: Secondary | ICD-10-CM

## 2018-10-22 ENCOUNTER — Telehealth: Payer: Self-pay | Admitting: *Deleted

## 2018-10-22 ENCOUNTER — Telehealth: Payer: Self-pay

## 2018-10-22 DIAGNOSIS — O24419 Gestational diabetes mellitus in pregnancy, unspecified control: Secondary | ICD-10-CM | POA: Insufficient documentation

## 2018-10-22 LAB — GLUCOSE TOLERANCE, 2 HOURS W/ 1HR
Glucose, 1 hour: 180 mg/dL — ABNORMAL HIGH (ref 65–179)
Glucose, 2 hour: 179 mg/dL — ABNORMAL HIGH (ref 65–152)
Glucose, Fasting: 84 mg/dL (ref 65–91)

## 2018-10-22 MED ORDER — ACCU-CHEK FASTCLIX LANCETS MISC: 1.0000 [IU] | Freq: Four times a day (QID) | 12 refills | Status: DC

## 2018-10-22 MED ORDER — ACCU-CHEK NANO SMARTVIEW W/DEVICE KIT: 1.0000 | PACK | 0 refills | Status: DC

## 2018-10-22 MED ORDER — GLUCOSE BLOOD VI STRP: ORAL_STRIP | 12 refills | Status: DC

## 2018-10-22 NOTE — Telephone Encounter (Signed)
Received a voicemail from Denise HartNicole stating she received her glucose results re: MyChart but needs someone to explain them.  Per chart is a [facility] patient. Will forward to that office.

## 2018-10-22 NOTE — Telephone Encounter (Signed)
Contacted to advise of glucose results, no answer, left vm.

## 2018-10-23 NOTE — Progress Notes (Signed)
She should still go to education session and review her testing options with diabetic educator. Given that she was diagnosed early, she may require insulin which are additional injections. The diabetic educator may discuss an insulin pump which could benefit her. Encourage her to keep her education session and ask questions.

## 2018-10-28 ENCOUNTER — Encounter (HOSPITAL_COMMUNITY): Payer: Self-pay | Admitting: *Deleted

## 2018-10-28 ENCOUNTER — Inpatient Hospital Stay (HOSPITAL_COMMUNITY)
Admission: AD | Admit: 2018-10-28 | Discharge: 2018-10-28 | Disposition: A | Discharge status: Home / Self Care | Payer: Medicaid Other | Source: Ambulatory Visit | Attending: Obstetrics & Gynecology | Admitting: Obstetrics & Gynecology

## 2018-10-28 ENCOUNTER — Other Ambulatory Visit: Payer: Self-pay

## 2018-10-28 DIAGNOSIS — O09299 Supervision of pregnancy with other poor reproductive or obstetric history, unspecified trimester: Secondary | ICD-10-CM

## 2018-10-28 DIAGNOSIS — Z8759 Personal history of other complications of pregnancy, childbirth and the puerperium: Secondary | ICD-10-CM

## 2018-10-28 DIAGNOSIS — Z7982 Long term (current) use of aspirin: Secondary | ICD-10-CM | POA: Diagnosis not present

## 2018-10-28 DIAGNOSIS — Z3A15 15 weeks gestation of pregnancy: Secondary | ICD-10-CM | POA: Diagnosis not present

## 2018-10-28 DIAGNOSIS — A084 Viral intestinal infection, unspecified: Secondary | ICD-10-CM | POA: Diagnosis not present

## 2018-10-28 DIAGNOSIS — O99613 Diseases of the digestive system complicating pregnancy, third trimester: Secondary | ICD-10-CM | POA: Insufficient documentation

## 2018-10-28 DIAGNOSIS — E860 Dehydration: Secondary | ICD-10-CM | POA: Diagnosis not present

## 2018-10-28 DIAGNOSIS — O09292 Supervision of pregnancy with other poor reproductive or obstetric history, second trimester: Secondary | ICD-10-CM | POA: Diagnosis not present

## 2018-10-28 DIAGNOSIS — Z3480 Encounter for supervision of other normal pregnancy, unspecified trimester: Secondary | ICD-10-CM

## 2018-10-28 DIAGNOSIS — O26892 Other specified pregnancy related conditions, second trimester: Secondary | ICD-10-CM | POA: Diagnosis not present

## 2018-10-28 DIAGNOSIS — O210 Mild hyperemesis gravidarum: Secondary | ICD-10-CM | POA: Diagnosis present

## 2018-10-28 HISTORY — DX: Gestational diabetes mellitus in pregnancy, unspecified control: O24.419

## 2018-10-28 LAB — URINALYSIS, ROUTINE W REFLEX MICROSCOPIC
BACTERIA UA: NONE SEEN
BILIRUBIN URINE: NEGATIVE
GLUCOSE, UA: NEGATIVE mg/dL
HGB URINE DIPSTICK: NEGATIVE
KETONES UR: 20 mg/dL — AB
NITRITE: NEGATIVE
PH: 6 (ref 5.0–8.0)
Protein, ur: NEGATIVE mg/dL
SPECIFIC GRAVITY, URINE: 1.023 (ref 1.005–1.030)

## 2018-10-28 LAB — COMPREHENSIVE METABOLIC PANEL
ALT: 16 U/L (ref 0–44)
ANION GAP: 9 (ref 5–15)
AST: 18 U/L (ref 15–41)
Albumin: 3.5 g/dL (ref 3.5–5.0)
Alkaline Phosphatase: 47 U/L (ref 38–126)
BUN: 9 mg/dL (ref 6–20)
CO2: 19 mmol/L — AB (ref 22–32)
Calcium: 8.4 mg/dL — ABNORMAL LOW (ref 8.9–10.3)
Chloride: 103 mmol/L (ref 98–111)
Creatinine, Ser: 0.76 mg/dL (ref 0.44–1.00)
GFR calc non Af Amer: >60 mL/min (ref >60)
Glucose, Bld: 108 mg/dL — ABNORMAL HIGH (ref 70–99)
Potassium: 4.1 mmol/L (ref 3.5–5.1)
Sodium: 131 mmol/L — ABNORMAL LOW (ref 135–145)
TOTAL PROTEIN: 7.3 g/dL (ref 6.5–8.1)
Total Bilirubin: 0.5 mg/dL (ref 0.3–1.2)

## 2018-10-28 LAB — CBC
HCT: 37.6 % (ref 36.0–46.0)
Hemoglobin: 12.2 g/dL (ref 12.0–15.0)
MCH: 24.7 pg — ABNORMAL LOW (ref 26.0–34.0)
MCHC: 32.4 g/dL (ref 30.0–36.0)
MCV: 76.1 fL — ABNORMAL LOW (ref 80.0–100.0)
Platelets: 232 10*3/uL (ref 150–400)
RBC: 4.94 MIL/uL (ref 3.87–5.11)
RDW: 15.4 % (ref 11.5–15.5)
WBC: 4.9 10*3/uL (ref 4.0–10.5)
nRBC: 0 % (ref 0.0–0.2)

## 2018-10-28 LAB — LIPASE, BLOOD: Lipase: 24 U/L (ref 11–51)

## 2018-10-28 MED ORDER — LACTATED RINGERS IV BOLUS
1000.0000 mL | Freq: Once | INTRAVENOUS | Status: AC
  Administered 2018-10-28: 1000 mL via INTRAVENOUS

## 2018-10-28 MED ORDER — ONDANSETRON 4 MG PO TBDP: 4.0000 mg | ORAL_TABLET | Freq: Three times a day (TID) | ORAL | 0 refills | Status: DC | PRN

## 2018-10-28 MED ORDER — ONDANSETRON 4 MG PO TBDP
4.0000 mg | ORAL_TABLET | Freq: Three times a day (TID) | ORAL | Status: DC | PRN
  Administered 2018-10-28: 4 mg via ORAL
  Filled 2018-10-28: qty 1

## 2018-10-28 MED ORDER — SODIUM CHLORIDE 0.9 % IV SOLN
8.0000 mg | Freq: Once | INTRAVENOUS | Status: DC
  Filled 2018-10-28: qty 4

## 2018-10-28 NOTE — Discharge Instructions (Signed)

## 2018-10-28 NOTE — MAU Provider Note (Addendum)
History     CSN: [id number]  Arrival date and time: 10/28/18 0930   First Provider Initiated Contact with Patient 10/28/18 1047      Chief Complaint  Patient presents with  . Emesis  . Abdominal Pain   G2P1001 _0 .0 wks here with N/V. Sx started this am. Had 6 episodes. Cannot tolerate anything po. Reports being exposed to kids with same sx. No fevers. No diarrhea. Having some abd pain since emesis started. Denies VB.   OB History    Gravida  2   Para  1   Term  1   Preterm      AB      Living  1     SAB      TAB      Ectopic      Multiple      Live Births  1           Past Medical History:  Diagnosis Date  . Allergy   . Arthritis   . Asthma   . Gestational diabetes   . Hx of chlamydia infection 2017  . Pregnancy induced hypertension    Pre-E  . Seasonal allergies   . Trichomonas 2017    Past Surgical History:  Procedure Laterality Date  . NO PAST SURGERIES      Family History  Problem Relation Age of Onset  . Arthritis Mother   . Diabetes Mother   . Diabetes Maternal Grandmother   . Arthritis Maternal Grandmother     Social History   Tobacco Use  . Smoking status: Never Smoker  . Smokeless tobacco: Never Used  Substance Use Topics  . Alcohol use: No  . Drug use: No    Allergies: No Known Allergies  Medications Prior to Admission  Medication Sig Dispense Refill Last Dose  . ACCU-CHEK FASTCLIX LANCETS MISC 1 Units by Percutaneous route 4 (four) times daily. 100 each 12   . albuterol (PROAIR HFA) 108 (90 Base) MCG/ACT inhaler Inhale 1-2 puffs into the lungs every 4 (four) hours as needed for wheezing or shortness of breath. 6.7 g 1 Taking  . aspirin EC 81 MG tablet Take 1 tablet (81 mg total) by mouth daily. Take after 12 weeks for prevention of preeclampsia later in pregnancy 300 tablet 2 Taking  . Blood Glucose Monitoring Suppl (ACCU-CHEK NANO SMARTVIEW) w/Device KIT 1 kit by Subdermal route as directed. Check blood sugars for  fasting, and two hours after breakfast, lunch and dinner (4 checks daily) 1 kit 0   . butalbital-acetaminophen-caffeine (FIORICET, ESGIC) 50-325-40 MG tablet Take 1 tablet by mouth every 6 (six) hours as needed for headache. 10 tablet 0   . glucose blood (ACCU-CHEK SMARTVIEW) test strip Use as instructed to check blood sugars 100 each 12   . metroNIDAZOLE (FLAGYL) 500 MG tablet Take 1 tablet (500 mg total) by mouth 2 (two) times daily. 14 tablet 0   . Prenatal Vit-Fe Fumarate-FA (MULTIVITAMIN-PRENATAL) 27-0.8 MG TABS tablet Take 1 tablet by mouth daily at 12 noon.   Taking    Review of Systems  Constitutional: Negative for chills and fever.  Gastrointestinal: Positive for abdominal pain, nausea and vomiting. Negative for constipation and diarrhea.  Genitourinary: Negative for dysuria and vaginal bleeding.   Physical Exam   Blood pressure 135/86, pulse 85, temperature 98.5 F (36.9 C), temperature source Oral, resp. rate 20, height _1  (1.676 m), weight 115.4 kg, last menstrual period 07/08/2018, SpO2 100 %.  Physical Exam  Constitutional:  She is oriented to person, place, and time. She appears well-developed and well-nourished. No distress.  HENT:  Head: Normocephalic and atraumatic.  Neck: Normal range of motion.  Cardiovascular: Normal rate.  Respiratory: Effort normal. No respiratory distress.  GI: Soft. She exhibits no distension and no mass. There is tenderness in the left lower quadrant. There is no rebound and no guarding.  Musculoskeletal: Normal range of motion.  Neurological: She is alert and oriented to person, place, and time.  Skin: Skin is warm and dry.  Psychiatric: She has a normal mood and affect.   Unable to obtain FHT by doppler > Limited bedside US: viable, active fetus, +cardiac activity, subj. nml AFV  Results for orders placed or performed during the hospital encounter of 10/28/18 (from the past 24 hour(s))  Urinalysis, Routine w reflex microscopic      Status: Abnormal   Collection Time: 10/28/18 10:00 AM  Result Value Ref Range   Color, Urine YELLOW YELLOW   APPearance CLOUDY (A) CLEAR   Specific Gravity, Urine 1.023 1.005 - 1.030   pH 6.0 5.0 - 8.0   Glucose, UA NEGATIVE NEGATIVE mg/dL   Hgb urine dipstick NEGATIVE NEGATIVE   Bilirubin Urine NEGATIVE NEGATIVE   Ketones, ur 20 (A) NEGATIVE mg/dL   Protein, ur NEGATIVE NEGATIVE mg/dL   Nitrite NEGATIVE NEGATIVE   Leukocytes, UA SMALL (A) NEGATIVE   RBC / HPF 0-5 0 - 5 RBC/hpf   WBC, UA 6-10 0 - 5 WBC/hpf   Bacteria, UA NONE SEEN NONE SEEN   Squamous Epithelial / LPF 21-50 0 - 5   Mucus PRESENT   Lipase, blood     Status: None   Collection Time: 10/28/18 10:47 AM  Result Value Ref Range   Lipase 24 11 - 51 U/L  CBC     Status: Abnormal   Collection Time: 10/28/18 11:11 AM  Result Value Ref Range   WBC 4.9 4.0 - 10.5 K/uL   RBC 4.94 3.87 - 5.11 MIL/uL   Hemoglobin 12.2 12.0 - 15.0 g/dL   HCT 37.6 36.0 - 46.0 %   MCV 76.1 (L) 80.0 - 100.0 fL   MCH 24.7 (L) 26.0 - 34.0 pg   MCHC 32.4 30.0 - 36.0 g/dL   RDW 15.4 11.5 - 15.5 %   Platelets 232 150 - 400 K/uL   nRBC 0.0 0.0 - 0.2 %  Comprehensive metabolic panel     Status: Abnormal   Collection Time: 10/28/18 11:11 AM  Result Value Ref Range   Sodium 131 (L) 135 - 145 mmol/L   Potassium 4.1 3.5 - 5.1 mmol/L   Chloride 103 98 - 111 mmol/L   CO2 19 (L) 22 - 32 mmol/L   Glucose, Bld 108 (H) 70 - 99 mg/dL   BUN 9 6 - 20 mg/dL   Creatinine, Ser 0.76 0.44 - 1.00 mg/dL   Calcium 8.4 (L) 8.9 - 10.3 mg/dL   Total Protein 7.3 6.5 - 8.1 g/dL   Albumin 3.5 3.5 - 5.0 g/dL   AST 18 15 - 41 U/L   ALT 16 0 - 44 U/L   Alkaline Phosphatase 47 38 - 126 U/L   Total Bilirubin 0.5 0.3 - 1.2 mg/dL   GFR calc non Af Amer >60 >60 mL/min   GFR calc Af Amer >60 >60 mL/min   Anion gap 9 5 - 15   MAU Course  Procedures LR Zofran  MDM Labs ordered and reviewed. Likely viral. Pt feeling better. No emesis.  Tolerating po. Stable for  discharge home.   Assessment and Plan  [redacted] weeks gestation Viral gastroenteritis Dehydration Discharge home Follow up in OB office as scheduled Hydrate OOW today and tomorrow Rx Zofran  Allergies as of 10/28/2018   No Known Allergies     Medication List    TAKE these medications   ACCU-CHEK FASTCLIX LANCETS Misc 1 Units by Percutaneous route 4 (four) times daily.   ACCU-CHEK NANO SMARTVIEW w/Device Kit 1 kit by Subdermal route as directed. Check blood sugars for fasting, and two hours after breakfast, lunch and dinner (4 checks daily)   albuterol 108 (90 Base) MCG/ACT inhaler Commonly known as:  PROVENTIL HFA;VENTOLIN HFA Inhale 1-2 puffs into the lungs every 4 (four) hours as needed for wheezing or shortness of breath.   aspirin EC 81 MG tablet Take 1 tablet (81 mg total) by mouth daily. Take after 12 weeks for prevention of preeclampsia later in pregnancy   butalbital-acetaminophen-caffeine 50-325-40 MG tablet Commonly known as:  FIORICET, ESGIC Take 1 tablet by mouth every 6 (six) hours as needed for headache.   glucose blood test strip Use as instructed to check blood sugars   metroNIDAZOLE 500 MG tablet Commonly known as:  FLAGYL Take 1 tablet (500 mg total) by mouth 2 (two) times daily.   multivitamin-prenatal 27-0.8 MG Tabs tablet Take 1 tablet by mouth daily at 12 noon.   ondansetron 4 MG disintegrating tablet Commonly known as:  ZOFRAN-ODT Take 1 tablet (4 mg total) by mouth every 8 (eight) hours as needed for nausea or vomiting.       Julianne Handler, CNM 10/28/2018, 1:24 PM

## 2018-10-28 NOTE — MAU Note (Signed)
Presents with c/o emesis & abdominal pain that began this morning.  Reports has vomited more than 7 times this morning.  Denies VB.

## 2018-10-28 NOTE — MAU Note (Signed)
Pt upset about IV attempts & infiltration, requesting po meds.  Provider informed.

## 2018-10-30 ENCOUNTER — Other Ambulatory Visit: Payer: Self-pay | Admitting: *Deleted

## 2018-10-30 MED ORDER — ACCU-CHEK GUIDE W/DEVICE KIT: 1.0000 | PACK | Freq: Once | 0 refills | Status: AC

## 2018-10-30 MED ORDER — GLUCOSE BLOOD VI STRP: ORAL_STRIP | 12 refills | Status: DC

## 2018-10-30 NOTE — Progress Notes (Signed)
Orders changed for glucose testing supplies due to insurance coverage.

## 2018-11-03 ENCOUNTER — Other Ambulatory Visit: Payer: Self-pay | Admitting: *Deleted

## 2018-11-03 MED ORDER — ACCU-CHEK GUIDE W/DEVICE KIT: 1.0000 | PACK | Freq: Once | 0 refills | Status: AC

## 2018-11-09 ENCOUNTER — Encounter (HOSPITAL_COMMUNITY): Payer: Self-pay

## 2018-11-09 ENCOUNTER — Other Ambulatory Visit (HOSPITAL_COMMUNITY): Payer: Self-pay | Admitting: *Deleted

## 2018-11-09 ENCOUNTER — Ambulatory Visit (HOSPITAL_COMMUNITY)
Admission: RE | Admit: 2018-11-09 | Discharge: 2018-11-09 | Disposition: A | Discharge status: Home / Self Care | Payer: Medicaid Other | Source: Ambulatory Visit | Attending: Obstetrics and Gynecology | Admitting: Obstetrics and Gynecology

## 2018-11-09 ENCOUNTER — Other Ambulatory Visit: Payer: Self-pay | Admitting: Obstetrics & Gynecology

## 2018-11-09 DIAGNOSIS — O09292 Supervision of pregnancy with other poor reproductive or obstetric history, second trimester: Secondary | ICD-10-CM

## 2018-11-09 DIAGNOSIS — Z3480 Encounter for supervision of other normal pregnancy, unspecified trimester: Secondary | ICD-10-CM

## 2018-11-09 DIAGNOSIS — O09299 Supervision of pregnancy with other poor reproductive or obstetric history, unspecified trimester: Secondary | ICD-10-CM

## 2018-11-09 DIAGNOSIS — O99212 Obesity complicating pregnancy, second trimester: Secondary | ICD-10-CM | POA: Diagnosis not present

## 2018-11-09 DIAGNOSIS — O26872 Cervical shortening, second trimester: Secondary | ICD-10-CM

## 2018-11-09 DIAGNOSIS — Z3686 Encounter for antenatal screening for cervical length: Secondary | ICD-10-CM

## 2018-11-09 DIAGNOSIS — Z3A16 16 weeks gestation of pregnancy: Secondary | ICD-10-CM | POA: Diagnosis not present

## 2018-11-09 DIAGNOSIS — O24410 Gestational diabetes mellitus in pregnancy, diet controlled: Secondary | ICD-10-CM

## 2018-11-09 MED ORDER — PROGESTERONE MICRONIZED 200 MG PO CAPS: 200.0000 mg | ORAL_CAPSULE | Freq: Every day | ORAL | 4 refills | Status: DC

## 2018-11-16 ENCOUNTER — Encounter: Payer: Self-pay | Admitting: Obstetrics & Gynecology

## 2018-11-16 ENCOUNTER — Ambulatory Visit (INDEPENDENT_AMBULATORY_CARE_PROVIDER_SITE_OTHER): Payer: Medicaid Other | Admitting: Advanced Practice Midwife

## 2018-11-16 VITALS — BP 134/87 | HR 90 | Wt 256.0 lb

## 2018-11-16 DIAGNOSIS — O26872 Cervical shortening, second trimester: Secondary | ICD-10-CM

## 2018-11-16 DIAGNOSIS — Z3482 Encounter for supervision of other normal pregnancy, second trimester: Secondary | ICD-10-CM

## 2018-11-16 DIAGNOSIS — Z3480 Encounter for supervision of other normal pregnancy, unspecified trimester: Secondary | ICD-10-CM

## 2018-11-16 DIAGNOSIS — O24410 Gestational diabetes mellitus in pregnancy, diet controlled: Secondary | ICD-10-CM | POA: Diagnosis not present

## 2018-11-16 NOTE — Patient Instructions (Signed)

## 2018-11-16 NOTE — Progress Notes (Signed)
   PRENATAL VISIT NOTE  Subjective:  Denise [name] is a 27 y.o. G2P1001 at 9444w5d being seen today for ongoing prenatal care.  She is currently monitored for the following issues for this high-risk pregnancy and has Asthma; Needle phobia; History of gestational hypertension; Supervision of other normal pregnancy, antepartum; History of prior pregnancy with short cervix, currently pregnant; and Gestational diabetes mellitus (GDM) in second trimester on their problem list.  Patient reports no complaints.   .  .   . Denies leaking of fluid.   The following portions of the patient's history were reviewed and updated as appropriate: allergies, current medications, past family history, past medical history, past social history, past surgical history and problem list. Problem list updated.  Objective:  There were no vitals filed for this visit.  Fetal Status:           General:  Alert, oriented and cooperative. Patient is in no acute distress.  Skin: Skin is warm and dry. No rash noted.   Cardiovascular: Normal heart rate noted  Respiratory: Normal respiratory effort, no problems with respiration noted  Abdomen: Soft, gravid, appropriate for gestational age.        Pelvic: Cervical exam deferred        Extremities: Normal range of motion.     Mental Status: Normal mood and affect. Normal behavior. Normal judgment and thought content.   Assessment and Plan:  Pregnancy: G2P1001 at 2544w5d  1. Supervision of other normal pregnancy, antepartum --Anticipatory guidance about next visits/weeks of pregnancy given.  2. Diet controlled gestational diabetes mellitus (GDM) in second trimester --Checking sugars, highest 140 after sweets, otherwise wnl.  3. Short cervix during pregnancy in second trimester --1.4 cm on 12/23. Started on vaginal progesterone.  Serial US ordered.  Preterm labor symptoms and general obstetric precautions including but not limited to vaginal bleeding, contractions, leaking  of fluid and fetal movement were reviewed in detail with the patient. Please refer to After Visit Summary for other counseling recommendations.  No follow-ups on file.  Future Appointments  Date Time Provider Department Center  11/16/2018  2:30 PM Hurshel PartyLeftwich-Kirby, Iker Nuttall A, CNM [facility] None  11/23/2018  9:00 AM [facility] US 3 [facility]  11/25/2018  3:15 PM [facility] CLASS [facility] NDM    Sharen CounterLisa Leftwich-Kirby, CNM

## 2018-11-17 ENCOUNTER — Other Ambulatory Visit (HOSPITAL_COMMUNITY): Payer: Self-pay

## 2018-11-18 NOTE — L&D Delivery Note (Signed)
Patient: HALLYE BLANDIN MRN: [id number]  GBS status: pending, IAP given: ampicillin 2g x 1 and azithromycin 500mg  x 1  Patient is a 28 y.o. now [id number] s/p NSVD at [redacted]w[redacted]d, who was admitted for PPROM and preterm labor. PPROM 7h 3m prior to delivery with clear fluid.    Delivery Note At 10:35 PM a viable female was delivered via Vaginal, Spontaneous (Presentation: cephalic).  APGAR: pending (see NICU consult note); weight: pending.   Placenta status: intact, 3-vessel cord.  Cord:  with the following complications: none.  Cord cut and clamped immediately following delivery to initiate resuscitation with NICU team.  Amniotic fluid with strong foul smell at delivery.   Anesthesia: None  Episiotomy: None Lacerations: None Suture Repair: None Est. Blood Loss (mL):  53ml  Mom to postpartum.  Baby to NICU.  Gwenevere Abbot 01/01/2019, 10:55 PM

## 2018-11-23 ENCOUNTER — Encounter (HOSPITAL_COMMUNITY): Payer: Self-pay | Admitting: Anesthesiology

## 2018-11-23 ENCOUNTER — Encounter (HOSPITAL_COMMUNITY): Payer: Self-pay

## 2018-11-23 ENCOUNTER — Ambulatory Visit (HOSPITAL_COMMUNITY)
Admission: AD | Admit: 2018-11-23 | Discharge: 2018-11-23 | Disposition: A | Discharge status: Home / Self Care | Payer: Medicaid Other | Attending: Family Medicine | Admitting: Family Medicine

## 2018-11-23 ENCOUNTER — Encounter (HOSPITAL_COMMUNITY)
Admission: AD | Disposition: A | Discharge status: Home / Self Care | Payer: Self-pay | Source: Home / Self Care | Attending: Family Medicine

## 2018-11-23 ENCOUNTER — Inpatient Hospital Stay (EMERGENCY_DEPARTMENT_HOSPITAL)
Admission: AD | Admit: 2018-11-23 | Discharge: 2018-11-23 | Disposition: A | Discharge status: Home / Self Care | Payer: Medicaid Other | Source: Ambulatory Visit | Attending: Family Medicine | Admitting: Family Medicine

## 2018-11-23 ENCOUNTER — Encounter (HOSPITAL_COMMUNITY)
Admission: AD | Disposition: A | Discharge status: Home / Self Care | Payer: Self-pay | Source: Ambulatory Visit | Attending: Family Medicine

## 2018-11-23 ENCOUNTER — Ambulatory Visit (HOSPITAL_BASED_OUTPATIENT_CLINIC_OR_DEPARTMENT_OTHER)
Admission: RE | Admit: 2018-11-23 | Discharge: 2018-11-23 | Disposition: A | Discharge status: Home / Self Care | Payer: Medicaid Other | Source: Ambulatory Visit | Attending: Obstetrics and Gynecology | Admitting: Obstetrics and Gynecology

## 2018-11-23 ENCOUNTER — Encounter (HOSPITAL_COMMUNITY): Payer: Self-pay | Admitting: *Deleted

## 2018-11-23 ENCOUNTER — Other Ambulatory Visit (HOSPITAL_COMMUNITY): Payer: Self-pay | Admitting: Obstetrics and Gynecology

## 2018-11-23 ENCOUNTER — Ambulatory Visit (HOSPITAL_COMMUNITY): Payer: Medicaid Other | Admitting: Anesthesiology

## 2018-11-23 DIAGNOSIS — Z8759 Personal history of other complications of pregnancy, childbirth and the puerperium: Secondary | ICD-10-CM

## 2018-11-23 DIAGNOSIS — Z3A18 18 weeks gestation of pregnancy: Secondary | ICD-10-CM | POA: Insufficient documentation

## 2018-11-23 DIAGNOSIS — O99512 Diseases of the respiratory system complicating pregnancy, second trimester: Secondary | ICD-10-CM | POA: Diagnosis not present

## 2018-11-23 DIAGNOSIS — O09292 Supervision of pregnancy with other poor reproductive or obstetric history, second trimester: Secondary | ICD-10-CM | POA: Diagnosis not present

## 2018-11-23 DIAGNOSIS — Z7982 Long term (current) use of aspirin: Secondary | ICD-10-CM | POA: Diagnosis not present

## 2018-11-23 DIAGNOSIS — O26872 Cervical shortening, second trimester: Secondary | ICD-10-CM

## 2018-11-23 DIAGNOSIS — O24410 Gestational diabetes mellitus in pregnancy, diet controlled: Secondary | ICD-10-CM

## 2018-11-23 DIAGNOSIS — Z3480 Encounter for supervision of other normal pregnancy, unspecified trimester: Secondary | ICD-10-CM

## 2018-11-23 DIAGNOSIS — O3432 Maternal care for cervical incompetence, second trimester: Secondary | ICD-10-CM | POA: Insufficient documentation

## 2018-11-23 DIAGNOSIS — O99212 Obesity complicating pregnancy, second trimester: Secondary | ICD-10-CM

## 2018-11-23 DIAGNOSIS — J45909 Unspecified asthma, uncomplicated: Secondary | ICD-10-CM | POA: Insufficient documentation

## 2018-11-23 DIAGNOSIS — O09299 Supervision of pregnancy with other poor reproductive or obstetric history, unspecified trimester: Secondary | ICD-10-CM

## 2018-11-23 HISTORY — PX: CERVICAL CERCLAGE: SHX1329

## 2018-11-23 LAB — COMPREHENSIVE METABOLIC PANEL
ALT: 19 U/L (ref 0–44)
AST: 19 U/L (ref 15–41)
Albumin: 3.5 g/dL (ref 3.5–5.0)
Alkaline Phosphatase: 50 U/L (ref 38–126)
Anion gap: 8 (ref 5–15)
BUN: 7 mg/dL (ref 6–20)
CO2: 19 mmol/L — AB (ref 22–32)
Calcium: 8.8 mg/dL — ABNORMAL LOW (ref 8.9–10.3)
Chloride: 107 mmol/L (ref 98–111)
Creatinine, Ser: 0.72 mg/dL (ref 0.44–1.00)
GFR calc Af Amer: >60 mL/min (ref >60)
GFR calc non Af Amer: >60 mL/min (ref >60)
Glucose, Bld: 89 mg/dL (ref 70–99)
Potassium: 3.8 mmol/L (ref 3.5–5.1)
Sodium: 134 mmol/L — ABNORMAL LOW (ref 135–145)
Total Bilirubin: 0.4 mg/dL (ref 0.3–1.2)
Total Protein: 7.5 g/dL (ref 6.5–8.1)

## 2018-11-23 LAB — CBC
HCT: 38.1 % (ref 36.0–46.0)
HEMOGLOBIN: 12.4 g/dL (ref 12.0–15.0)
MCH: 24.8 pg — ABNORMAL LOW (ref 26.0–34.0)
MCHC: 32.5 g/dL (ref 30.0–36.0)
MCV: 76.4 fL — ABNORMAL LOW (ref 80.0–100.0)
Platelets: 223 10*3/uL (ref 150–400)
RBC: 4.99 MIL/uL (ref 3.87–5.11)
RDW: 15.6 % — ABNORMAL HIGH (ref 11.5–15.5)
WBC: 5 10*3/uL (ref 4.0–10.5)
nRBC: 0 % (ref 0.0–0.2)

## 2018-11-23 LAB — GLUCOSE, CAPILLARY
Glucose-Capillary: 83 mg/dL (ref 70–99)
Glucose-Capillary: 71 mg/dL (ref 70–99)

## 2018-11-23 SURGERY — CERCLAGE, CERVIX, VAGINAL APPROACH
Anesthesia: Spinal

## 2018-11-23 MED ORDER — PHENYLEPHRINE HCL 10 MG/ML IJ SOLN
INTRAMUSCULAR | Status: DC | PRN
  Administered 2018-11-23 (×2): 40 ug via INTRAVENOUS

## 2018-11-23 MED ORDER — LACTATED RINGERS IV SOLN
INTRAVENOUS | Status: DC
  Administered 2018-11-23 (×2): via INTRAVENOUS

## 2018-11-23 MED ORDER — PHENYLEPHRINE 40 MCG/ML (10ML) SYRINGE FOR IV PUSH (FOR BLOOD PRESSURE SUPPORT)
PREFILLED_SYRINGE | INTRAVENOUS | Status: AC
  Filled 2018-11-23: qty 10

## 2018-11-23 MED ORDER — ONDANSETRON HCL 4 MG/2ML IJ SOLN
INTRAMUSCULAR | Status: DC | PRN
  Administered 2018-11-23: 4 mg via INTRAVENOUS

## 2018-11-23 MED ORDER — SODIUM CHLORIDE 0.9 % IR SOLN
Status: DC | PRN
  Administered 2018-11-23: 500 mL via INTRAVESICAL

## 2018-11-23 MED ORDER — BUPIVACAINE IN DEXTROSE 0.75-8.25 % IT SOLN
INTRATHECAL | Status: DC | PRN
  Administered 2018-11-23: 1.2 mL via INTRATHECAL

## 2018-11-23 MED ORDER — INDOMETHACIN 50 MG PO CAPS: 50.0000 mg | ORAL_CAPSULE | Freq: Four times a day (QID) | ORAL | 0 refills | Status: DC

## 2018-11-23 MED ORDER — SODIUM CHLORIDE 0.9 % IR SOLN
Status: DC | PRN
  Administered 2018-11-23: 500 mL

## 2018-11-23 MED ORDER — ONDANSETRON HCL 4 MG/2ML IJ SOLN
INTRAMUSCULAR | Status: AC
  Filled 2018-11-23: qty 2

## 2018-11-23 SURGICAL SUPPLY — 16 items
CANISTER SUCT 3000ML PPV (MISCELLANEOUS) ×3 IMPLANT
GLOVE BIOGEL PI IND STRL 7.0 (GLOVE) ×2 IMPLANT
GLOVE BIOGEL PI INDICATOR 7.0 (GLOVE) ×4
GLOVE ECLIPSE 7.0 STRL STRAW (GLOVE) ×3 IMPLANT
GOWN STRL REUS W/TWL LRG LVL3 (GOWN DISPOSABLE) ×9 IMPLANT
NDL MAYO CATGUT SZ4 TPR NDL (NEEDLE) ×1 IMPLANT
NEEDLE MAYO CATGUT SZ4 (NEEDLE) ×3 IMPLANT
PACK VAGINAL MINOR WOMEN LF (CUSTOM PROCEDURE TRAY) ×3 IMPLANT
PAD OB MATERNITY 4.3X12.25 (PERSONAL CARE ITEMS) ×3 IMPLANT
PAD PREP 24X48 CUFFED NSTRL (MISCELLANEOUS) ×3 IMPLANT
SUT MERSILENE 5MM BP 1 12 (SUTURE) ×3 IMPLANT
TOWEL OR 17X24 6PK STRL BLUE (TOWEL DISPOSABLE) ×6 IMPLANT
TRAY FOLEY W/BAG SLVR 14FR (SET/KITS/TRAYS/PACK) ×3 IMPLANT
TUBING NON-CON 1/4 X 20 CONN (TUBING) IMPLANT
TUBING NON-CON 1/4 X 20' CONN (TUBING)
YANKAUER SUCT BULB TIP NO VENT (SUCTIONS) IMPLANT

## 2018-11-23 NOTE — Transfer of Care (Signed)
Immediate Anesthesia Transfer of Care Note  Patient: Denise [name]  Procedure(s) Performed: CERCLAGE CERVICAL (N/A )  Patient Location: PACU and Labor and Delivery  Anesthesia Type:Spinal  Level of Consciousness: awake, alert  and oriented  Airway & Oxygen Therapy: Patient Spontanous Breathing  Post-op Assessment: Report given to RN and Post -op Vital signs reviewed and stable  Post vital signs: Reviewed and stable  Last Vitals:  Vitals Value Taken Time  BP    Temp    Pulse    Resp    SpO2      Last Pain:  Vitals:   11/23/18 1426  TempSrc: Oral         Complications: No apparent anesthesia complications

## 2018-11-23 NOTE — Anesthesia Preprocedure Evaluation (Addendum)
Anesthesia Evaluation  Patient identified by MRN, date of birth, ID band Patient awake    Reviewed: Allergy & Precautions, NPO status , Patient's Chart, lab work & pertinent test results  History of Anesthesia Complications Negative for: history of anesthetic complications  Airway Mallampati: II  TM Distance: >3 FB Neck ROM: Full    Dental no notable dental hx. (+) Dental Advisory Given   Pulmonary asthma ,    Pulmonary exam normal        Cardiovascular hypertension, Normal cardiovascular exam     Neuro/Psych PSYCHIATRIC DISORDERS Anxiety negative neurological ROS     GI/Hepatic negative GI ROS, Neg liver ROS,   Endo/Other  diabetesMorbid obesity  Renal/GU negative Renal ROS  negative genitourinary   Musculoskeletal negative musculoskeletal ROS (+)   Abdominal   Peds negative pediatric ROS (+)  Hematology negative hematology ROS (+)   Anesthesia Other Findings   Reproductive/Obstetrics negative OB ROS                            Anesthesia Physical Anesthesia Plan  ASA: III  Anesthesia Plan: Spinal   Post-op Pain Management:    Induction:   PONV Risk Score and Plan: 3 and Ondansetron, Scopolamine patch - Pre-op and Diphenhydramine  Airway Management Planned: Natural Airway and Simple Face Mask  Additional Equipment:   Intra-op Plan:   Post-operative Plan:   Informed Consent: I have reviewed the patients History and Physical, chart, labs and discussed the procedure including the risks, benefits and alternatives for the proposed anesthesia with the patient or authorized representative who has indicated his/her understanding and acceptance.   Dental advisory given  Plan Discussed with: Anesthesiologist and CRNA  Anesthesia Plan Comments:        Anesthesia Quick Evaluation

## 2018-11-23 NOTE — Anesthesia Postprocedure Evaluation (Signed)
Anesthesia Post Note  Patient: Denise [name]  Procedure(s) Performed: CERCLAGE CERVICAL (N/A )     Patient location during evaluation: PACU Anesthesia Type: Spinal Level of consciousness: awake and alert Pain management: pain level controlled Vital Signs Assessment: post-procedure vital signs reviewed and stable Respiratory status: spontaneous breathing and respiratory function stable Cardiovascular status: blood pressure returned to baseline and stable Postop Assessment: spinal receding Anesthetic complications: no    Last Vitals:  Vitals:   11/23/18 1730 11/23/18 1745  BP: 129/69 119/71  Pulse: 83 80  Resp: 18 18  Temp:    SpO2: 100% 100%    Last Pain:  Vitals:   11/23/18 1745  TempSrc:   PainSc: 0-No pain   Pain Goal:                 Dempsey Knotek DANIEL

## 2018-11-23 NOTE — H&P (Signed)
  Denise [name] is an 28 y.o. G2P1001 female.   Chief Complaint: incompetent cervix. HPI: Denise [name] is a 28 y.o. G2P1001 at [redacted]w[redacted]d by LMP who presents with findings of no measurable cervix in MFM today. Placed on Prometrium after last week's u/s and findings of Cervical length of 1.4 cm. Has not consistently been taking her Prometrium. She has been advised of need for rescue cerclage as well as risks. She is in agreement with this.   Past Medical History:  Diagnosis Date  . Allergy   . Arthritis   . Asthma   . Gestational diabetes   . Hx of chlamydia infection 2017  . Pregnancy induced hypertension    Pre-E  . Seasonal allergies   . Trichomonas 2017    Past Surgical History:  Procedure Laterality Date  . NO PAST SURGERIES      Family History  Problem Relation Age of Onset  . Arthritis Mother   . Diabetes Mother   . Diabetes Maternal Grandmother   . Arthritis Maternal Grandmother    Social History:  reports that she has never smoked. She has never used smokeless tobacco. She reports that she does not drink alcohol or use drugs.  Allergies: No Known Allergies  Medications Prior to Admission  Medication Sig Dispense Refill  . albuterol (PROAIR HFA) 108 (90 Base) MCG/ACT inhaler Inhale 1-2 puffs into the lungs every 4 (four) hours as needed for wheezing or shortness of breath. 6.7 g 1  . aspirin EC 81 MG tablet Take 1 tablet (81 mg total) by mouth daily. Take after 12 weeks for prevention of preeclampsia later in pregnancy 300 tablet 2  . Prenatal Vit-Fe Fumarate-FA (MULTIVITAMIN-PRENATAL) 27-0.8 MG TABS tablet Take 1 tablet by mouth daily at 12 noon.    . progesterone (PROMETRIUM) 200 MG capsule Place 1 capsule (200 mg total) vaginally daily. 30 capsule 4  . ACCU-CHEK FASTCLIX LANCETS MISC 1 Units by Percutaneous route 4 (four) times daily. (Patient not taking: Reported on 11/23/2018) 100 each 12  . glucose blood (ACCU-CHEK GUIDE) test strip Use as instructed (Patient not  taking: Reported on 11/23/2018) 100 each 12    A comprehensive review of systems was negative.  Blood pressure 134/68, pulse 94, temperature 97.6 F (36.4 C), temperature source Oral, resp. rate 18, height 5\' 6"  (1.676 m), weight 115.2 kg, last menstrual period 07/08/2018, SpO2 100 %. General appearance: alert, cooperative and appears stated age Head: Normocephalic, without obvious abnormality, atraumatic Neck: supple, symmetrical, trachea midline Lungs: normal effort Heart: regular rate and rhythm Abdomen: gravid, non-tender Extremities: Homans sign is negative, no sign of DVT Skin: Skin color, texture, turgor normal. No rashes or lesions Neurologic: Grossly normal   Lab Results  Component Value Date   WBC 5.0 11/23/2018   HGB 12.4 11/23/2018   HCT 38.1 11/23/2018   MCV 76.4 (L) 11/23/2018   PLT 223 11/23/2018   Lab Results  Component Value Date   PREGTESTUR POSITIVE (A) 08/15/2018     Assessment/Plan Principal Problem:   Short cervix during pregnancy in second trimester  For rescue cervical cerclage. Risks include but are not limited to bleeding, infection, injury to surrounding structures, including bowel, bladder and ureters, blood clots, and death.  Likelihood of success is moderate.    Reva Bores 11/23/2018, 3:57 PM

## 2018-11-23 NOTE — MAU Provider Note (Signed)
Faculty [facility] OB/[facility]N Attending MAU Note  Chief Complaint: cervical incompetence    First Provider Initiated Contact with Patient 11/23/18 1027      SUBJECTIVE Denise [name] is a 28 y.o. G2P1001 at [redacted]w[redacted]d by LMP who presents with findings of no measurable cervix in MFM today. Placed on Prometrium after last week's u/s and findings of Cervical length of 1.4 cm. Has not consistently been taking her Prometrium. She has been advised of need for rescue cerclage as well as risks. She is in agreement with this. She has H/o GDM A2/B and CBG this am was 106 (not fasting--Snickers at 0100) and then ate cheesy grits. Will need 8 hour NPO.  Past Medical History:  Diagnosis Date  . Allergy   . Arthritis   . Asthma   . Gestational diabetes   . Hx of chlamydia infection 2017  . Pregnancy induced hypertension    Pre-E  . Seasonal allergies   . Trichomonas 2017   OB History  Gravida Para Term Preterm AB Living  2 1 1     1   SAB TAB Ectopic Multiple Live Births          1    # Outcome Date GA Lbr Len/2nd Weight Sex Delivery Anes PTL Lv  2 Current           1 Term 10/28/13 [redacted]w[redacted]d 05:35 / 00:19 2880 g F Vag-Spont None  LIV     Birth Comments: Newborn Screen: Normal, Hgb, FA  NBS Barcode: [id number]   Past Surgical History:  Procedure Laterality Date  . NO PAST SURGERIES     Social History   Socioeconomic History  . Marital status: Single    Spouse name: Not on file  . Number of children: 1  . Years of education: Not on file  . Highest education level: Not on file  Occupational History  . Occupation: Childcare  Social Needs  . Financial resource strain: Somewhat hard  . Food insecurity:    Worry: Sometimes true    Inability: Sometimes true  . Transportation needs:    Medical: No    Non-medical: No  Tobacco Use  . Smoking status: Never Smoker  . Smokeless tobacco: Never Used  Substance and Sexual Activity  . Alcohol use: No  . Drug use: No  . Sexual activity: Yes     Partners: Male    Birth control/protection: None    Comment: with monagamous partner   Lifestyle  . Physical activity:    Days per week: 3 days    Minutes per session: 30 min  . Stress: Not on file  Relationships  . Social connections:    Talks on phone: More than three times a week    Gets together: Twice a week    Attends religious service: More than 4 times per year    Active member of club or organization: Yes    Attends meetings of clubs or organizations: More than 4 times per year    Relationship status: Never married  . Intimate partner violence:    Fear of current or ex partner: No    Emotionally abused: No    Physically abused: No    Forced sexual activity: No  Other Topics Concern  . Not on file  Social History Narrative   Pt is from [place]. She lives in [place] with her mother and child.    No current facility-administered medications on file prior to encounter.    Current Outpatient Medications  on File Prior to Encounter  Medication Sig Dispense Refill  . ACCU-CHEK FASTCLIX LANCETS MISC 1 Units by Percutaneous route 4 (four) times daily. 100 each 12  . albuterol (PROAIR HFA) 108 (90 Base) MCG/ACT inhaler Inhale 1-2 puffs into the lungs every 4 (four) hours as needed for wheezing or shortness of breath. 6.7 g 1  . aspirin EC 81 MG tablet Take 1 tablet (81 mg total) by mouth daily. Take after 12 weeks for prevention of preeclampsia later in pregnancy 300 tablet 2  . glucose blood (ACCU-CHEK GUIDE) test strip Use as instructed 100 each 12  . Prenatal Vit-Fe Fumarate-FA (MULTIVITAMIN-PRENATAL) 27-0.8 MG TABS tablet Take 1 tablet by mouth daily at 12 noon.    . progesterone (PROMETRIUM) 200 MG capsule Place 1 capsule (200 mg total) vaginally daily. 30 capsule 4   No Known Allergies  ROS: Pertinent items in HPI  OBJECTIVE BP 129/79   Pulse 90   Temp 98.1 F (36.7 C) (Oral)   Resp 20   LMP 07/08/2018  CONSTITUTIONAL: Well-developed, well-nourished  female in no acute distress.  HENT:  Normocephalic, atraumatic, External right and left ear normal. Oropharynx is clear and moist EYES: Conjunctivae and EOM are normal. Pupils are equal, round, and reactive to light. No scleral icterus.  NECK: Normal range of motion, supple, no masses.  Normal thyroid.  SKIN: Skin is warm and dry. No rash noted. Not diaphoretic. No erythema. No pallor. NEUROLGIC: Alert and oriented to person, place, and time. Normal reflexes, muscle tone coordination. No cranial nerve deficit noted. PSYCHIATRIC: Normal mood and affect. Normal behavior. Normal judgment and thought content. CARDIOVASCULAR: Normal heart rate noted RESPIRATORY: Effort and breath sounds normal, no problems with respiration noted. ABDOMEN: Soft, normal bowel sounds, no distention noted.  No tenderness, rebound or guarding.  PELVIC: Normal appearing external genitalia; normal appearing vaginal mucosa. Cervix with membranes visible, but cervix feels FTP, 70% effaced. MUSCULOSKELETAL: Normal range of motion. No tenderness.  No cyanosis, clubbing, or edema.  2+ distal pulses.  LAB RESULTS No results found for this or any previous visit (from the past 48 hour(s)).  IMAGING [place] Mfm Ob Transvaginal  Result Date: 11/23/2018 ----------------------------------------------------------------------  OBSTETRICS REPORT                       (Signed Final 11/23/2018 10:03 am) ---------------------------------------------------------------------- Patient Info  ID #:       [id number]                          D.O.B.:  October 06, 1991 (27 yrs)  Name:       Denise [name]                 Visit Date: 11/23/2018 09:26 am ---------------------------------------------------------------------- Performed By  Performed By:     Emeline Darling BS,      Ref. Address:     Faculty                    RDMS  Attending:        Noralee Space MD        Location:         [facility]  Referred By:      Catalina Antigua MD  ---------------------------------------------------------------------- Orders   #  Description  Code         Ordered By   1  US MFM OB TRANSVAGINAL               [id number]      [facility]   2  US MFM OB LIMITED                    [id number]     [facility]  ----------------------------------------------------------------------   #  Order #                    Accession #                 Episode #   1  [id number]                  [id number]                  [id number]   2  [id number]                  [id number]                  [id number]  ---------------------------------------------------------------------- Indications   Encounter for cervical length (Hx short cervix Z36.86   w/ last pregnancy)   Cervical shortening, second trimester          O26.872   Obesity complicating pregnancy, second         O99.212   trimester   Poor obstetric history: Previous               O09.299   preeclampsia / eclampsia/gestational HTN   Asthma                                         O99.89 j45.909   Gestational diabetes in pregnancy, diet        O24.410   controlled   [redacted] weeks gestation of pregnancy                Z3A.18  ---------------------------------------------------------------------- Vital Signs  Weight (lb): 256                               Height:        5'6"  BMI:         41.31 ---------------------------------------------------------------------- Fetal Evaluation  Num Of Fetuses:         1  Fetal Heart Rate(bpm):  154  Cardiac Activity:       Observed  Presentation:           Cephalic  Placenta:               Anterior  P. Cord Insertion:      Visualized  Amniotic Fluid  AFI FV:      Within normal limits                              Largest Pocket(cm)                              8.1 ---------------------------------------------------------------------- OB History  Gravidity:    2         Term:   1  Living:  1 ---------------------------------------------------------------------- Gestational Age   LMP:           19w 5d        Date:  07/08/18                 EDD:   04/14/19  Best:          Mackie Pai 5d     Det. ByMarcella Dubs         EDD:   04/21/19                                      (08/25/18) ---------------------------------------------------------------------- Anatomy  Other:  Female gender. ---------------------------------------------------------------------- Cervix Uterus Adnexa  Cervix  Length:            0.2  cm.  Appears dilated, see comments. ---------------------------------------------------------------------- Impression  Patient returned for cervical length measurement. She admits  being irregular with vaginal progesterone and "skipped" last 3  days. She does not have symptoms of pelvic pressure or  vaginal bleeding.  Amniotic fluid is normal and good fetal activity is seen. We  performed transvaginal scan. The cervical canal is completely  dilated and there is hardly any measurable cervix (2  millimeters). I explained the findings and recommended  sterile-speculum examination to assess external os.  Patient prefers a female physician to examine her. Based on  transvaginal ultrasound, I recommended rescue cerclage.  Patient will benefit from rescue cerclage give that she is not  compliant with medications.  She was sent over to the MAU for further evaluation by Dr.  Tawni Levy team. ----------------------------------------------------------------------                  Noralee Space, MD Electronically Signed Final Report   11/23/2018 10:03 am ----------------------------------------------------------------------  [place] Mfm Ob Transvaginal  Result Date: 11/09/2018 ----------------------------------------------------------------------  OBSTETRICS REPORT                       (Signed Final 11/09/2018 09:37 am) ---------------------------------------------------------------------- Patient Info  ID #:       [id number]                          D.O.B.:  1991-05-09 (27 yrs)  Name:       Denise [name]                  Visit Date: 11/09/2018 07:56 am ---------------------------------------------------------------------- Performed By  Performed By:     Tomma Lightning             Ref. Address:     Faculty                    RDMS,RVT  Attending:        Noralee Space MD        Location:         [facility]  Referred By:      Catalina Antigua MD ---------------------------------------------------------------------- Orders   #  Description                          Code         Ordered By   1  [place] MFM OB TRANSVAGINAL  [address].6      [facility]  ----------------------------------------------------------------------   #  Order #                    Accession #                 Episode #   1  [id number]                  [id number]                  [id number]  ---------------------------------------------------------------------- Indications   [redacted] weeks gestation of pregnancy                Z3A.16   Encounter for cervical length (Hx short cervix Z36.86   w/ last pregnancy)   Obesity complicating pregnancy, second         O99.212   trimester   Poor obstetric history: Previous               O09.299   preeclampsia / eclampsia/gestational HTN   Asthma                                         O99.89 j45.909   Gestational diabetes in pregnancy, diet        O24.410   controlled   Cervical shortening, second trimester          O26.872  ---------------------------------------------------------------------- Vital Signs  Weight (lb): 253                               Height:        5'6"  BMI:         40.83        Pulse:  92  BP:          125/82 ---------------------------------------------------------------------- Fetal Evaluation  Num Of Fetuses:         1  Fetal Heart Rate(bpm):  142  Cardiac Activity:       Observed  Presentation:           Variable  Placenta:               Anterior  Amniotic Fluid  AFI FV:      Within normal limits ---------------------------------------------------------------------- OB History   Gravidity:    2         Term:   1  Living:       1 ---------------------------------------------------------------------- Gestational Age  LMP:           17w 5d        Date:  07/08/18                 EDD:   04/14/19  Best:          16w 5d     Det. ByMarcella Dubs         EDD:   04/21/19                                      (08/25/18) ---------------------------------------------------------------------- Cervix Uterus Adnexa  Cervix  Length:            1.4  cm.  Measured transvaginally. ---------------------------------------------------------------------- Impression  Patient returned for cervical length  measurement. We dated  her pregnancy by early ultrasound measurement (5w 6d),  which was not consistent with her LMP date. Her EDD is  04/20/18.  Patient has a history of term delivery, but had short cervix in  that pregnancy. She does not have symptoms of vaginal  discharge or bleeding.  Amniotic fluid is normal and good fetal activity is seen. We  performed transvaginal ultrasound to evaluate the cervix. The  shortest cervical length measurement is 1.4 cm, with no  further shortening on suprapubic or fundal pressures.  Amniotic fluid sludge is seen near the internal os and a small  funnel is noted.  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 It is associated with an increased risk of  miscarriage or preterm delivery. However, her history of term  delivery is encouraging and is likely be associated with a  lower incidence of preterm delivery. Patient has not had any  cervical surgeries since her last delivery.  Studies have shown that in women with no history of preterm  birth and a current short cervical length, treatment vaginal  progesterone (micronized progesterone 200 milligrams daily)  leads to a significant reduction of risk of spontaneous preterm  birth before 34  weeks.  Alternative treatment would be cervical cerclage.  However,  cerclage in women without a history of preterm birth and  short cervix has not shown a statistical reduction in preterm  delivery rates. I discussed the procedure and its possible  complications.  I informed her that cerclage may be a better option if she is  likely to be noncompliant with vaginal progesterone. Patient  understands the importance of daily administration of vaginal  progesterone to have a successful outcome.  Patient opted to have vaginal progesterone.  I recommended vaginal prometrium 200 mg to be taken daily  up to 36 weeks. I encouraged her to call your office or the  Perinatal center, if the patient is noncompliant or unable to  take progesterone. Cerclage may be a better option in that  scenario (before 24 weeks).  I informed her that cerclage or progesterone treatment does  not guarantee carrying the pregnancy to term.  Discussed with Dr. Debroah Loop, who will be e-prescribing  progesterone today. ---------------------------------------------------------------------- Recommendations  -An appointment was made for her to return in 2 weeks for  cervical length measurement. ----------------------------------------------------------------------                  Noralee Space, MD Electronically Signed Final Report   11/09/2018 09:37 am ----------------------------------------------------------------------  [place] Mfm Ob Limited  Result Date: 11/23/2018 ----------------------------------------------------------------------  OBSTETRICS REPORT                       (Signed Final 11/23/2018 10:03 am) ---------------------------------------------------------------------- Patient Info  ID #:       [id number]                          D.O.B.:  1991/09/27 (27 yrs)  Name:       Denise [name]                 Visit Date: 11/23/2018 09:26 am ---------------------------------------------------------------------- Performed By  Performed By:     Emeline Darling BS,      Ref. Address:     Faculty  RDMS  Attending:        Noralee Spaceavi Shankar MD        [facility]  Referred By:      Catalina AntiguaPEGGY                    CONSTANT MD ---------------------------------------------------------------------- Orders   #  Description                          Code         Ordered By   1  US MFM OB TRANSVAGINAL               [id number]      [facility]   2  US MFM OB LIMITED                    76815.01     [facility]  ----------------------------------------------------------------------   #  Order #                    Accession #                 Episode #   1  [id number]                  [id number]                  [id number]   2  [id number]                  [id number]                  [id number]  ---------------------------------------------------------------------- Indications   Encounter for cervical length (Hx short cervix Z36.86   w/ last pregnancy)   Cervical shortening, second trimester          O26.872   Obesity complicating pregnancy, second         O99.212   trimester   Poor obstetric history: Previous               O09.299   preeclampsia / eclampsia/gestational HTN   Asthma                                         O99.89 j45.909   Gestational diabetes in pregnancy, diet        O24.410   controlled   [redacted] weeks gestation of pregnancy                Z3A.18  ---------------------------------------------------------------------- Vital Signs  Weight (lb): 256                               Height:        5'6"  BMI:         41.31 ---------------------------------------------------------------------- Fetal Evaluation  Num Of Fetuses:         1  Fetal Heart Rate(bpm):  154  Cardiac Activity:       Observed  Presentation:           Cephalic  Placenta:               Anterior  P. Cord Insertion:      Visualized  Amniotic Fluid  AFI FV:  Within normal limits                              Largest Pocket(cm)                              8.1  ---------------------------------------------------------------------- OB History  Gravidity:    2         Term:   1  Living:       1 ---------------------------------------------------------------------- Gestational Age  LMP:           19w 5d        Date:  07/08/18                 EDD:   04/14/19  Best:          Mackie Pai 5d     Det. ByMarcella Dubs         EDD:   04/21/19                                      (08/25/18) ---------------------------------------------------------------------- Anatomy  Other:  Female gender. ---------------------------------------------------------------------- Cervix Uterus Adnexa  Cervix  Length:            0.2  cm.  Appears dilated, see comments. ---------------------------------------------------------------------- Impression  Patient returned for cervical length measurement. She admits  being irregular with vaginal progesterone and "skipped" last 3  days. She does not have symptoms of pelvic pressure or  vaginal bleeding.  Amniotic fluid is normal and good fetal activity is seen. We  performed transvaginal scan. The cervical canal is completely  dilated and there is hardly any measurable cervix (2  millimeters). I explained the findings and recommended  sterile-speculum examination to assess external os.  Patient prefers a female physician to examine her. Based on  transvaginal ultrasound, I recommended rescue cerclage.  Patient will benefit from rescue cerclage give that she is not  compliant with medications.  She was sent over to the MAU for further evaluation by Dr.  Tawni Levy team. ----------------------------------------------------------------------                  Noralee Space, MD Electronically Signed Final Report   11/23/2018 10:03 am ----------------------------------------------------------------------   MAU COURSE  ASSESSMENT 1. Short cervix during pregnancy in second trimester   2. Supervision of other normal pregnancy, antepartum   3. History of prior pregnancy  with short cervix, currently pregnant   4. History of gestational hypertension     PLAN Discharge home NPO except water over next 6 hours. Stop drinking at 2 pm. Rescue cerclage planned for 4 pm. Return to hospital at 2:15 to prepare for surgery. Risks include but are not limited to bleeding, infection, injury to surrounding structures, including bowel, bladder and ureters, blood clots, and death.  Likelihood of success is moderate.   Follow-up Information    [facility] Follow up.   Specialty:  Obstetrics and Gynecology Why:  keep next scheduled appointment Contact information: [address] [place] [address] [phone]         Allergies as of 11/23/2018   No Known Allergies     Medication List    TAKE these medications   ACCU-CHEK FASTCLIX LANCETS Misc 1 Units by Percutaneous route 4 (four) times daily.  albuterol 108 (90 Base) MCG/ACT inhaler Commonly known as:  PROAIR HFA Inhale 1-2 puffs into the lungs every 4 (four) hours as needed for wheezing or shortness of breath.   aspirin EC 81 MG tablet Take 1 tablet (81 mg total) by mouth daily. Take after 12 weeks for prevention of preeclampsia later in pregnancy   glucose blood test strip Commonly known as:  ACCU-CHEK GUIDE Use as instructed   multivitamin-prenatal 27-0.8 MG Tabs tablet Take 1 tablet by mouth daily at 12 noon.   progesterone 200 MG capsule Commonly known as:  PROMETRIUM Place 1 capsule (200 mg total) vaginally daily.        Reva Bores, MD 11/23/2018 10:45 AM

## 2018-11-23 NOTE — Op Note (Signed)
Preoperative diagnosis: no measurable cervix   Postoperative diagnosis: Same  Procedure: Rescue Cervical cerclage  Surgeon: Tinnie Gens, M.D.  Anesthesia: spinal Denise Roberts, MD  Findings: 0.5-1 cm open cervix with membranes at os  Estimated blood loss: 25 cc  Specimens: None  Reason for procedure: Denise [name] G2P1001 [redacted]w[redacted]d, with no measurable cervix in MFM. Has been intermittently on Prometrium.  Procedure: Patient was taken to the operating room where spinal analgesia was administered. She was prepped and draped in the usual sterile fashion.  A Foley catheter is used to drain her bladder. A timeout was performed. The patient had SCDs in place. The patient was in dorsal lithotomy.  A weighted speculum was placed inside the vagina.  A Deaver was used anteriorly. Given the appearance of the cervix, the bladder was back-filled with 500 cc sterile saline and membranes pushed back up. The cervix was grasped with an open ring forcep. A 5 mm Mersilene band on a cutting needle was used and to put in a pursestring suture. This was started at 12:00 and exiting at 9:00, starting at 9:00 and exiting at 6:00, starting at 6:00 and exiting at 3:00, starting at 3:00 and exiting at 12:00. A suture was then tied down. All instrument, needle and lap counts were correct x 2. The patient was taken to recovery in stable condition.  Denise Proctor PrattMD 11/23/2018 4:59 PM

## 2018-11-23 NOTE — ED Notes (Signed)
Pt to MAU via wheelchair for cervical evaluation.  Report given to J. Spurlock-Frizzel, Charity fundraiser.

## 2018-11-23 NOTE — Discharge Instructions (Signed)
Cervical Cerclage    Cervical cerclage is a surgical procedure to correct a cervix that opens up and thins out before pregnancy is at term (cervical insufficiency, also called incompetent cervix). This condition can cause labor to start early (prematurely). This procedure involves using stitches to sew the cervix shut during pregnancy.  Your surgeon may use ultrasound equipment to help guide the procedure and monitor your baby. Ultrasound equipment uses sound waves to take images of your cervix and uterus. Your surgeon will assess these images on a monitor in the operating room.  Tell a health care provider about:   Any allergies you have, especially any allergies related to prescribed medicine, stitches, or anesthetic medicines.   All medicines you are taking, including vitamins, herbs, eye drops, creams, and over-the-counter medicines. Bring a list of all of your medicines to your appointment.   Your medical history, including prior labor deliveries.   Any problems you or family members have had with anesthetic medicines.   Any blood disorders you have.   Any surgeries you have had, including prior cervical stitching.   Any medical conditions you have.   Whether you are pregnant or may be pregnant.  What are the risks?  Generally, this is a safe procedure. However, problems may occur, including:   Infection, such as infection of the cervix or amniotic sac.   Vaginal bleeding.   Allergic reactions to medicines.   Damage to other structures or organs, such as tearing (rupture) of membranes or cervical laceration.   Premature contractions including going into early labor and delivery.   Cervical dystocia, which occurs when the cervix is unable to dilate normally during labor.  What happens before the procedure?  Staying hydrated  Follow instructions from your health care provider about hydration, which may include:   Up to 2 hours before the procedure - you may continue to drink clear liquids, such as  water, clear fruit juice, black coffee, and plain tea.  Eating and drinking restrictions  Follow instructions from your health care provider about eating and drinking, which may include:   8 hours before the procedure - stop eating heavy meals or foods such as meat, fried foods, or fatty foods.   6 hours before the procedure - stop eating light meals or foods, such as toast or cereal.   6 hours before the procedure - stop drinking milk or drinks that contain milk.   2 hours before the procedure - stop drinking clear liquids.  Medicines   Ask your health care provider about:  ? Changing or stopping your regular medicines. This is especially important if you are taking diabetes medicines or blood thinners.  ? Taking medicines such as aspirin and ibuprofen. These medicines can thin your blood. Do not take these medicines before your procedure if your health care provider instructs you not to.   You may be given antibiotic medicine to help prevent infection.  General instructions   Do not put on any lotion, deodorant, or perfume.   Remove contact lenses and jewelry.   Ask your health care provider how your surgical site will be marked or identified.   You may have an exam or testing.   You may have a blood or urine sample taken.   Plan to have someone take you home from the hospital or clinic.   If you will be going home right after the procedure, plan to have someone with you for 24 hours.  What happens during the procedure?     To reduce your risk of infection:  ? Your health care team will wash or sanitize their hands.  ? Your skin will be washed with soap.   An IV tube will be inserted into one of your veins.   You may be given one or more of the following:  ? A medicine to help you relax (sedative).  ? A medicine to numb the area (local anesthetic).  ? A medicine to make you fall asleep (general anesthetic).  ? A medicine that is injected into your spine to numb the area below and slightly above the  injection site (spinal anesthetic).  ? A medicine that is injected into an area of your body to numb everything below the injection site (regional anesthetic).   A lubricated instrument (speculum) will be inserted into your vagina. The speculum will be widened to open the walls of your vagina so your surgeon can see your cervix.   Your cervix will be grasped and tightly stitched closed (sutured). To do this, your surgeon will stitch a strong band of thread around your cervix, then the thread will be tightened to hold your cervix shut.  The procedure may vary among health care providers and hospitals.  What happens after the procedure?   Your blood pressure, heart rate, breathing rate, and blood oxygen level will be monitored until the medicines you were given have worn off. You will be monitored for premature contractions.   You may have light bleeding and mild cramping.   You may have to wear compression stockings. These stockings help to prevent blood clots and reduce swelling in your legs.   Do not drive for 24 hours if you received a sedative.   You may be put on bed rest.   You may be given medicine to prevent infection.   You may be given an injection of a hormone (progesterone) to prevent your uterus from tightening (contracting).  Summary   Cervical cerclage is a surgical procedure that involves using stitches to sew the cervix shut during pregnancy.   Your blood pressure, heart rate, breathing rate, and blood oxygen level will be monitored until the medicines you were given have worn off. You will be monitored for premature contractions.   You may need to be on bed rest after the procedure.   Plan to have someone take you home from the hospital or clinic.  This information is not intended to replace advice given to you by your health care provider. Make sure you discuss any questions you have with your health care provider.  Document Released: 10/17/2008 Document Revised: 06/28/2016 Document  Reviewed: 06/20/2016  Elsevier Interactive Patient Education  2019 [employer]

## 2018-11-23 NOTE — Discharge Instructions (Signed)
Cervical Cerclage, Care After    This sheet gives you information about how to care for yourself after your procedure. Your health care provider may also give you more specific instructions. If you have problems or questions, contact your health care provider.  What can I expect after the procedure?  After your procedure, it is common to have:  Cramping in your abdomen.  Mucus discharge for several days.  Painful urination (dysuria).  Small drops of blood coming from your vagina (spotting).  Follow these instructions at home:  Follow instructions from your health care provider about bed rest, if this applies. You may need to be on bed rest for up to 3 days.  Take over-the-counter and prescription medicines only as told by your health care provider.  Do not drive or use heavy machinery while taking prescription pain medicine.  Keep track of your vaginal discharge and watch for any changes. If you notice changes, tell your health care provider.  Avoid physical activities and exercise until your health care provider approves. Ask your health care provider what activities are safe for you.  Until your health care provider approves:  Do not douche.  Do not have sexual intercourse.  Keep all pre-birth (prenatal) visits and all follow-up visits as told by your health care provider. This is important. You will probably have weekly visits to have your cervix checked, and you may need an ultrasound.  Contact a health care provider if:  You have abnormal or bad-smelling vaginal discharge, such as clots.  You develop a rash on your skin. This may look like redness and swelling.  You become light-headed or feel like you are going to faint.  You have abdominal pain that does not get better with medicine.  You have persistent nausea or vomiting.  Get help right away if:  You have vaginal bleeding that is heavier or more frequent than spotting.  You are leaking fluid or have a gush of fluid from your vagina (your water  breaks).  You have a fever or chills.  You faint.  You have uterine contractions. These may feel like:  A back ache.  Lower abdominal pain.  Mild cramps, similar to menstrual cramps.  Tightening or pressure in your abdomen.  You think that your baby is not moving as much as usual, or you cannot feel your baby move.  You have chest pain.  You have shortness of breath.  This information is not intended to replace advice given to you by your health care provider. Make sure you discuss any questions you have with your health care provider.  Document Released: 08/25/2013 Document Revised: 07/03/2016 Document Reviewed: 06/07/2016  Elsevier Interactive Patient Education  2019 [employer] Inc[employer]

## 2018-11-23 NOTE — MAU Note (Signed)
Pt brought down from MFM, "no measurable cervix" on [place].  Pt denies any pain, bleeding, vag d/c.  No complaints.

## 2018-11-23 NOTE — Anesthesia Procedure Notes (Addendum)
Spinal  Patient location during procedure: OR Start time: 11/23/2018 4:19 PM End time: 11/23/2018 4:24 PM Staffing Anesthesiologist: Heather Roberts, MD Performed: anesthesiologist  Preanesthetic Checklist Completed: patient identified, surgical consent, pre-op evaluation, timeout performed, IV checked, risks and benefits discussed and monitors and equipment checked Spinal Block Patient position: sitting Prep: DuraPrep Patient monitoring: cardiac monitor, continuous pulse ox and blood pressure Approach: midline Location: L2-3 Injection technique: single-shot Needle Needle type: Pencan  Needle gauge: 24 G Needle length: 9 cm Additional Notes Functioning IV was confirmed and monitors were applied. Sterile prep and drape, including hand hygiene and sterile gloves were used. The patient was positioned and the spine was prepped. The skin was anesthetized with lidocaine.  Free flow of clear CSF was obtained prior to injecting local anesthetic into the CSF.  The spinal needle aspirated freely following injection.  The needle was carefully withdrawn.  The patient tolerated the procedure well.

## 2018-11-24 ENCOUNTER — Encounter (HOSPITAL_COMMUNITY): Payer: Self-pay | Admitting: Family Medicine

## 2018-11-25 ENCOUNTER — Ambulatory Visit (INDEPENDENT_AMBULATORY_CARE_PROVIDER_SITE_OTHER): Payer: Medicaid Other | Admitting: Obstetrics and Gynecology

## 2018-11-25 ENCOUNTER — Encounter: Payer: Self-pay | Admitting: Certified Nurse Midwife

## 2018-11-25 ENCOUNTER — Encounter: Payer: Self-pay | Admitting: Obstetrics and Gynecology

## 2018-11-25 ENCOUNTER — Ambulatory Visit: Payer: Self-pay | Admitting: Registered"

## 2018-11-25 VITALS — BP 144/90 | HR 86 | Wt 258.8 lb

## 2018-11-25 DIAGNOSIS — Z3480 Encounter for supervision of other normal pregnancy, unspecified trimester: Secondary | ICD-10-CM

## 2018-11-25 DIAGNOSIS — Z3A19 19 weeks gestation of pregnancy: Secondary | ICD-10-CM | POA: Diagnosis not present

## 2018-11-25 DIAGNOSIS — Z3482 Encounter for supervision of other normal pregnancy, second trimester: Secondary | ICD-10-CM

## 2018-11-25 DIAGNOSIS — O26872 Cervical shortening, second trimester: Secondary | ICD-10-CM

## 2018-11-25 DIAGNOSIS — O24419 Gestational diabetes mellitus in pregnancy, unspecified control: Secondary | ICD-10-CM | POA: Diagnosis not present

## 2018-11-25 MED ORDER — PANTOPRAZOLE SODIUM 40 MG PO TBEC: 40.0000 mg | DELAYED_RELEASE_TABLET | Freq: Every day | ORAL | 3 refills | Status: DC

## 2018-11-25 NOTE — Progress Notes (Addendum)
   PRENATAL VISIT NOTE  Subjective:  Denise [name] is a 28 y.o. G2P1001 at [redacted]w[redacted]d being seen today for ongoing prenatal care.  She is currently monitored for the following issues for this high-risk pregnancy and has Asthma; Needle phobia; Short cervix during pregnancy in second trimester; History of gestational hypertension; Supervision of other normal pregnancy, antepartum; History of prior pregnancy with short cervix, currently pregnant; and Gestational diabetes mellitus (GDM) in second trimester on their problem list.  Patient reports no complaints.  Contractions: Not present. Vag. Bleeding: None.  Movement: Present. Denies leaking of fluid.   The following portions of the patient's history were reviewed and updated as appropriate: allergies, current medications, past family history, past medical history, past social history, past surgical history and problem list. Problem list updated.  Objective:   Vitals:   11/25/18 1346 11/25/18 1350  BP: (!) 159/84 (!) 154/91  Pulse: 86   Weight: 258 lb 12.8 oz (117.4 kg)     Fetal Status:     Movement: Present     General:  Alert, oriented and cooperative. Patient is in no acute distress.  Skin: Skin is warm and dry. No rash noted.   Cardiovascular: Normal heart rate noted  Respiratory: Normal respiratory effort, no problems with respiration noted  Abdomen: Soft, gravid, appropriate for gestational age.  Pain/Pressure: Present     Pelvic: Cervical exam performed        Extremities: Normal range of motion.  Edema: None  Mental Status: Normal mood and affect. Normal behavior. Normal judgment and thought content.   Assessment and Plan:  Pregnancy: G2P1001 at [redacted]w[redacted]d  1. Supervision of other normal pregnancy, antepartum Patient is doing well without complaints Elevated BP today- Will monitor very closely as patient has a history of GHTN. Patient left without leaving urine sample Continue ASA which patient had stopped taking  2. Gestational  diabetes mellitus (GDM) in second trimester, gestational diabetes method of control unspecified CBG 76-143 Patient admits to overeating during the holidays Continue diet control  3. Short cervix during pregnancy in second trimester S/p rescue cerclage on 1/6 Continue prometrium  Preterm labor symptoms and general obstetric precautions including but not limited to vaginal bleeding, contractions, leaking of fluid and fetal movement were reviewed in detail with the patient. Please refer to After Visit Summary for other counseling recommendations.  No follow-ups on file.  Future Appointments  Date Time Provider Department Center  11/25/2018  3:15 PM [facility]  12/14/2018  8:30 AM Leftwich-Kirby, Wilmer Floor, CNM [facility] None    Catalina Antigua, MD

## 2018-11-26 ENCOUNTER — Encounter: Payer: Self-pay | Admitting: Advanced Practice Midwife

## 2018-11-27 ENCOUNTER — Encounter: Payer: Self-pay | Admitting: Obstetrics & Gynecology

## 2018-12-01 ENCOUNTER — Encounter (HOSPITAL_COMMUNITY): Payer: Self-pay | Admitting: *Deleted

## 2018-12-01 ENCOUNTER — Inpatient Hospital Stay (HOSPITAL_COMMUNITY)
Admission: AD | Admit: 2018-12-01 | Discharge: 2018-12-01 | Disposition: A | Discharge status: Home / Self Care | Payer: Medicaid Other | Attending: Family Medicine | Admitting: Family Medicine

## 2018-12-01 ENCOUNTER — Encounter: Payer: Self-pay | Admitting: Advanced Practice Midwife

## 2018-12-01 ENCOUNTER — Telehealth: Payer: Self-pay

## 2018-12-01 ENCOUNTER — Other Ambulatory Visit: Payer: Self-pay

## 2018-12-01 DIAGNOSIS — Z3480 Encounter for supervision of other normal pregnancy, unspecified trimester: Secondary | ICD-10-CM

## 2018-12-01 DIAGNOSIS — Z3A19 19 weeks gestation of pregnancy: Secondary | ICD-10-CM | POA: Diagnosis not present

## 2018-12-01 DIAGNOSIS — Z8759 Personal history of other complications of pregnancy, childbirth and the puerperium: Secondary | ICD-10-CM

## 2018-12-01 DIAGNOSIS — O209 Hemorrhage in early pregnancy, unspecified: Secondary | ICD-10-CM | POA: Diagnosis not present

## 2018-12-01 DIAGNOSIS — O4692 Antepartum hemorrhage, unspecified, second trimester: Secondary | ICD-10-CM | POA: Diagnosis not present

## 2018-12-01 DIAGNOSIS — O09299 Supervision of pregnancy with other poor reproductive or obstetric history, unspecified trimester: Secondary | ICD-10-CM

## 2018-12-01 DIAGNOSIS — R1030 Lower abdominal pain, unspecified: Secondary | ICD-10-CM | POA: Diagnosis present

## 2018-12-01 NOTE — MAU Note (Signed)
Had a cerclage placed last Monday, 1/6. Was told she may bleed for 3 days following. Did not bleed until today. Started this morning. Not soaking pads. No pain.

## 2018-12-01 NOTE — Telephone Encounter (Signed)
Return call to pt w/ complaints of red vaginal bleeding Pt denies and leaking of fluid, +FM, not soaking a pad , no clots pt notes cramping.  Pt had rescue cervical cerclage 11/23/2018 Pt advised to go to MAU for evaluation.  Pt voiced understanding

## 2018-12-01 NOTE — MAU Provider Note (Signed)
[facility] Provider Note   CSN: [id number] Arrival date & time: 12/01/18  1703     History   Chief Complaint Chief Complaint  Patient presents with  . Vaginal Bleeding    HPI Denise [name] is a 28 y.o. [id number]@ [redacted]w[redacted]d gestation here today with c/ovaginal bleeding and lower abdominal cramping. Patient reports having had a cerclage placed a week ago and was doing fine until today.    HPI  Past Medical History:  Diagnosis Date  . Allergy   . Arthritis   . Asthma   . Gestational diabetes   . Hx of chlamydia infection 2017  . Pregnancy induced hypertension    Pre-E  . Seasonal allergies   . Trichomonas 2017    Patient Active Problem List   Diagnosis Date Noted  . Gestational diabetes mellitus (GDM) in second trimester 10/22/2018  . Supervision of other normal pregnancy, antepartum 10/19/2018  . History of prior pregnancy with short cervix, currently pregnant 10/19/2018  . History of gestational hypertension 01/26/2018  . Short cervix during pregnancy in second trimester 07/28/2013  . 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 Asthma 04/29/2012    Past Surgical History:  Procedure Laterality Date  . CERVICAL CERCLAGE N/A 11/23/2018   Procedure: CERCLAGE CERVICAL;  Surgeon: Reva Bores, MD;  Location: [facility] BIR[facility] SU[facility];  Service: Gynecology;  Laterality: N/A;  . NO PAST SURGERIES       OB History    Gravida  2   Para  1   Term  1   Preterm      AB      Living  1     SAB      TAB      Ectopic      Multiple      Live Births  1            Home Medications    Prior to Admission medications   Medication Sig Start Date End Date Taking? Authorizing Provider  ACCU-CHEK FASTCLIX LANCETS MISC 1 Units by Percutaneous route 4 (four) times daily. 10/22/18   Constant, Peggy, MD  albuterol (PROAIR HFA) 108 (90 Base) MCG/ACT inhaler Inhale 1-2 puffs into the lungs every 4 (four) hours as needed for wheezing or shortness of breath. Patient not  taking: Reported on 11/25/2018 09/10/18   Magdalene River, PA-C  aspirin EC 81 MG tablet Take 1 tablet (81 mg total) by mouth daily. Take after 12 weeks for prevention of preeclampsia later in pregnancy Patient not taking: Reported on 11/25/2018 10/19/18   Constant, Peggy, MD  glucose blood (ACCU-CHEK GUIDE) test strip Use as instructed 10/30/18   Constant, Peggy, MD  indomethacin (INDOCIN) 50 MG capsule Take 1 capsule (50 mg total) by mouth 4 (four) times daily. 11/23/18   Reva Bores, MD  pantoprazole (PROTONIX) 40 MG tablet Take 1 tablet (40 mg total) by mouth daily. 11/25/18   Constant, Peggy, MD  Prenatal Vit-Fe Fumarate-FA (MULTIVITAMIN-PRENATAL) 27-0.8 MG TABS tablet Take 1 tablet by mouth daily at 12 noon.    [provider]  progesterone (PROMETRIUM) 200 MG capsule Place 1 capsule (200 mg total) vaginally daily. 11/09/18   Adam Phenix, MD    Family History Family History  Problem Relation Age of Onset  . Arthritis Mother   . Diabetes Mother   . Diabetes Maternal Grandmother   . Arthritis Maternal Grandmother     Social History Social History   Tobacco Use  . Smoking status:  Never Smoker  . Smokeless tobacco: Never Used  Substance Use Topics  . Alcohol use: No  . Drug use: No     Allergies   Patient has no known allergies.   Review of Systems Review of Systems  Gastrointestinal: Positive for abdominal pain.  Genitourinary: Positive for vaginal bleeding.  All other systems reviewed and are negative.    Physical Exam Updated Vital Signs BP 139/84 (BP Location: Right Arm)   Pulse 89   Temp 98.9 F (37.2 C) (Oral)   Resp 18   Wt 115.6 kg   LMP 07/08/2018   SpO2 100%   BMI 41.12 kg/m   Physical Exam Vitals signs and nursing note reviewed.  Constitutional:      General: She is not in acute distress.    Appearance: She is well-developed.  HENT:     Head: Normocephalic.  Eyes:     Conjunctiva/sclera: Conjunctivae normal.  Neck:      Musculoskeletal: Neck supple.  Cardiovascular:     Rate and Rhythm: Normal rate.  Pulmonary:     Effort: Pulmonary effort is normal.  Abdominal:     Palpations: Abdomen is soft.     Tenderness: There is no abdominal tenderness.  Genitourinary:    Comments: See Dr. Tawni Levy exam. Musculoskeletal: Normal range of motion.  Skin:    General: Skin is warm and dry.  Neurological:     Mental Status: She is alert and oriented to person, place, and time.  Psychiatric:        Mood and Affect: Mood normal.      ED Treatments / Results  Labs (all labs ordered are listed, but only abnormal results are displayed) Labs Reviewed - No data to display  Radiology No results found.  Procedures Procedures (including critical care time)  Medications Ordered in ED Medications - No data to display   Initial Impression / Assessment and Plan / ED Course  I have reviewed the triage vital signs and the nursing notes. 28 y.o. G2P1001 @ [redacted]w[redacted]d gestation who presents to the MAU with vaginal bleeding and lower abdominal cramping that is mild. Dr. Shawnie Pons in to see the patient and did pelvic exam and bedside U/S. She discussed with the patient clinical and u/s findings and plan of care. Patient agrees with plan. Patient stable for discharge with minimal bleeding. Return precautions discussed.   Final Clinical Impressions(s) / ED Diagnoses   Final diagnoses:  Vaginal bleeding in pregnancy, second trimester    ED Discharge Orders         Ordered    Increase activity slowly     12/01/18 1833

## 2018-12-02 ENCOUNTER — Other Ambulatory Visit (HOSPITAL_COMMUNITY): Payer: Self-pay

## 2018-12-14 ENCOUNTER — Other Ambulatory Visit (HOSPITAL_COMMUNITY): Payer: Self-pay | Admitting: *Deleted

## 2018-12-14 ENCOUNTER — Other Ambulatory Visit: Payer: Self-pay

## 2018-12-14 ENCOUNTER — Ambulatory Visit (HOSPITAL_COMMUNITY)
Admission: RE | Admit: 2018-12-14 | Discharge: 2018-12-14 | Disposition: A | Discharge status: Home / Self Care | Payer: Medicaid Other | Source: Ambulatory Visit | Attending: Obstetrics and Gynecology | Admitting: Obstetrics and Gynecology

## 2018-12-14 ENCOUNTER — Encounter (HOSPITAL_COMMUNITY): Payer: Self-pay

## 2018-12-14 ENCOUNTER — Ambulatory Visit (INDEPENDENT_AMBULATORY_CARE_PROVIDER_SITE_OTHER): Payer: Medicaid Other | Admitting: Advanced Practice Midwife

## 2018-12-14 ENCOUNTER — Ambulatory Visit (HOSPITAL_BASED_OUTPATIENT_CLINIC_OR_DEPARTMENT_OTHER)
Admission: RE | Admit: 2018-12-14 | Discharge: 2018-12-14 | Disposition: A | Discharge status: Home / Self Care | Payer: Medicaid Other | Source: Ambulatory Visit | Attending: Obstetrics and Gynecology | Admitting: Obstetrics and Gynecology

## 2018-12-14 VITALS — BP 140/90 | HR 100 | Wt 261.0 lb

## 2018-12-14 DIAGNOSIS — Z3A21 21 weeks gestation of pregnancy: Secondary | ICD-10-CM

## 2018-12-14 DIAGNOSIS — O99212 Obesity complicating pregnancy, second trimester: Secondary | ICD-10-CM

## 2018-12-14 DIAGNOSIS — O24410 Gestational diabetes mellitus in pregnancy, diet controlled: Secondary | ICD-10-CM | POA: Diagnosis not present

## 2018-12-14 DIAGNOSIS — Z3480 Encounter for supervision of other normal pregnancy, unspecified trimester: Secondary | ICD-10-CM | POA: Insufficient documentation

## 2018-12-14 DIAGNOSIS — O162 Unspecified maternal hypertension, second trimester: Secondary | ICD-10-CM | POA: Diagnosis not present

## 2018-12-14 DIAGNOSIS — O24419 Gestational diabetes mellitus in pregnancy, unspecified control: Secondary | ICD-10-CM | POA: Diagnosis not present

## 2018-12-14 DIAGNOSIS — O26872 Cervical shortening, second trimester: Secondary | ICD-10-CM

## 2018-12-14 DIAGNOSIS — O0992 Supervision of high risk pregnancy, unspecified, second trimester: Secondary | ICD-10-CM | POA: Diagnosis not present

## 2018-12-14 DIAGNOSIS — Z363 Encounter for antenatal screening for malformations: Secondary | ICD-10-CM

## 2018-12-14 DIAGNOSIS — O09292 Supervision of pregnancy with other poor reproductive or obstetric history, second trimester: Secondary | ICD-10-CM

## 2018-12-14 DIAGNOSIS — O169 Unspecified maternal hypertension, unspecified trimester: Secondary | ICD-10-CM | POA: Insufficient documentation

## 2018-12-14 DIAGNOSIS — O26879 Cervical shortening, unspecified trimester: Secondary | ICD-10-CM

## 2018-12-14 DIAGNOSIS — O0990 Supervision of high risk pregnancy, unspecified, unspecified trimester: Secondary | ICD-10-CM

## 2018-12-14 DIAGNOSIS — Z362 Encounter for other antenatal screening follow-up: Secondary | ICD-10-CM

## 2018-12-14 NOTE — Patient Instructions (Signed)

## 2018-12-14 NOTE — Progress Notes (Signed)
   PRENATAL VISIT NOTE  Subjective:  Denise [name] is a 28 y.o. G2P1001 at 33w5dbeing seen today for ongoing prenatal care.  She is currently monitored for the following issues for this high-risk pregnancy and has Asthma; Needle phobia; Short cervix during pregnancy in second trimester; Supervision of other normal pregnancy, antepartum; Gestational diabetes mellitus (GDM) in second trimester; and Hypertension complicating pregnancy on their problem list.  Patient reports no complaints.   . Vag. Bleeding: None.  Movement: Present. Denies leaking of fluid.   The following portions of the patient's history were reviewed and updated as appropriate: allergies, current medications, past family history, past medical history, past social history, past surgical history and problem list. Problem list updated.  Objective:   Vitals:   12/14/18 0830  BP: 140/90  Pulse: 100  Weight: 118.4 kg    Fetal Status: Fetal Heart Rate (bpm): 142   Movement: Present     General:  Alert, oriented and cooperative. Patient is in no acute distress.  Skin: Skin is warm and dry. No rash noted.   Cardiovascular: Normal heart rate noted  Respiratory: Normal respiratory effort, no problems with respiration noted  Abdomen: Soft, gravid, appropriate for gestational age.  Pain/Pressure: Absent     Pelvic: Cervical exam deferred        Extremities: Normal range of motion.  Edema: None  Mental Status: Normal mood and affect. Normal behavior. Normal judgment and thought content.   Assessment and Plan:  Pregnancy: G2P1001 at 282w5d1. Supervision of high risk pregnancy, antepartum --Anticipatory guidance about next visits/weeks of pregnancy given. --Cerclage in place, stable without complaints.  2. Short cervix during pregnancy in second trimester --Rescue cerclage placed 1/6 --Previous pregnancy with short cervix, but term delivery --This pregnancy, serial cervical lengths:  12/2 cervix long and closed  12/23  cervix 1.4 cm, vaginal progesterone started. Pt was not very compliant with medication, missed multiple doses.  1/6 cervical length 0.2 cm, rescue cerclage was placed  3. Gestational diabetes mellitus (GDM) in second trimester, gestational diabetes method of control unspecified --Pt reports glucose wnl fasting and PP.  Did not review log today.  4. Hypertension affecting pregnancy in second trimester --BP at one previous visit at 19 weeks, 130s/80s prior.  Pt reports she was very upset with MFM this morning because when her [place] rescheduled, she as only scheduled for cervical length and not her anatomy, which was originally scheduled together. She now has anatomy [place] today.  She believes her elevated BP is related to stress. --Baseline labs today. Reviewed warning signs/reasons to seek care.   - CBC - Comp Met (CMET) - Protein / creatinine ratio, urine  Preterm labor symptoms and general obstetric precautions including but not limited to vaginal bleeding, contractions, leaking of fluid and fetal movement were reviewed in detail with the patient. Please refer to After Visit Summary for other counseling recommendations.  No follow-ups on file.  Future Appointments  Date Time Provider [place]2/24/2020  8:30 AM Leftwich-Kirby, LiKathie DikeCNM [facility] None    LiFatima BlankCNM

## 2018-12-15 LAB — COMPREHENSIVE METABOLIC PANEL
ALT: 24 IU/L (ref 0–32)
AST: 17 IU/L (ref 0–40)
Albumin/Globulin Ratio: 1.3 (ref 1.2–2.2)
Albumin: 3.9 g/dL (ref 3.9–5.0)
Alkaline Phosphatase: 70 IU/L (ref 39–117)
BUN/Creatinine Ratio: 10 (ref 9–23)
BUN: 8 mg/dL (ref 6–20)
Bilirubin Total: <0.2 mg/dL (ref 0.0–1.2)
CO2: 18 mmol/L — AB (ref 20–29)
CREATININE: 0.79 mg/dL (ref 0.57–1.00)
Calcium: 9.6 mg/dL (ref 8.7–10.2)
Chloride: 105 mmol/L (ref 96–106)
GFR calc Af Amer: 119 mL/min/{1.73_m2} (ref >59)
GFR calc non Af Amer: 104 mL/min/{1.73_m2} (ref >59)
Globulin, Total: 2.9 g/dL (ref 1.5–4.5)
Glucose: 132 mg/dL — ABNORMAL HIGH (ref 65–99)
Potassium: 4.4 mmol/L (ref 3.5–5.2)
Sodium: 136 mmol/L (ref 134–144)
Total Protein: 6.8 g/dL (ref 6.0–8.5)

## 2018-12-15 LAB — CBC
Hematocrit: 37.2 % (ref 34.0–46.6)
Hemoglobin: 12.1 g/dL (ref 11.1–15.9)
MCH: 24.5 pg — ABNORMAL LOW (ref 26.6–33.0)
MCHC: 32.5 g/dL (ref 31.5–35.7)
MCV: 76 fL — ABNORMAL LOW (ref 79–97)
Platelets: 268 10*3/uL (ref 150–450)
RBC: 4.93 x10E6/uL (ref 3.77–5.28)
RDW: 15.4 % (ref 11.7–15.4)
WBC: 4.9 10*3/uL (ref 3.4–10.8)

## 2018-12-15 LAB — PROTEIN / CREATININE RATIO, URINE
Creatinine, Urine: 206.6 mg/dL
Protein, Ur: 22.9 mg/dL
Protein/Creat Ratio: 111 mg/g creat (ref 0–200)

## 2018-12-21 ENCOUNTER — Telehealth: Payer: Self-pay | Admitting: Advanced Practice Midwife

## 2018-12-21 ENCOUNTER — Encounter: Payer: Self-pay | Admitting: Advanced Practice Midwife

## 2018-12-21 DIAGNOSIS — Z3A22 22 weeks gestation of pregnancy: Secondary | ICD-10-CM

## 2018-12-21 DIAGNOSIS — Z20828 Contact with and (suspected) exposure to other viral communicable diseases: Secondary | ICD-10-CM

## 2018-12-21 MED ORDER — OSELTAMIVIR PHOSPHATE 6 MG/ML PO SUSR: 75.0000 mg | Freq: Two times a day (BID) | ORAL | 0 refills | Status: DC

## 2018-12-21 NOTE — Telephone Encounter (Signed)
Patient called frustrated because she states she has left a voicemail message and mychart message that has not been returned.  She wanted to check on her her lab results.  I briefly reviewed labs with her.  She had questions about what was too high for her sugars. She did not keep her appointment for Diabetes Management.  Sharen Counter also received her messages from this morning and plans to return her call after clinic is over.    Patient called back a second time to report her daughter has the flu and strep throat.  She wanted to know if she should be taking Tamiflu.   Misty Stanley will address when she calls patient back this afternoon.

## 2018-12-21 NOTE — Telephone Encounter (Signed)
Returned pt call and MyChart message regarding her recent lab results on 1/27. Reviewed normal preeclampsia labs. Pt denies preeclampsia symptoms.  Pt does report her daughter is diagnosed with the flu and she is concerned because she is pregnant. Tamiflu 75 mg BID x 5 days sent to pt pharmacy for prophylaxis.  Pt states understanding about labs and will pick up Rx for Tamiflu.  Next OB appt 01/11/19, but pt to call office with any warning signs of preeclampsia.

## 2018-12-26 ENCOUNTER — Encounter (HOSPITAL_COMMUNITY): Payer: Self-pay

## 2018-12-26 ENCOUNTER — Inpatient Hospital Stay (HOSPITAL_COMMUNITY)
Admission: AD | Admit: 2018-12-26 | Discharge: 2018-12-26 | Disposition: A | Discharge status: Home / Self Care | Payer: Medicaid Other | Source: Ambulatory Visit | Attending: Obstetrics and Gynecology | Admitting: Obstetrics and Gynecology

## 2018-12-26 DIAGNOSIS — R509 Fever, unspecified: Secondary | ICD-10-CM | POA: Diagnosis present

## 2018-12-26 DIAGNOSIS — G43909 Migraine, unspecified, not intractable, without status migrainosus: Secondary | ICD-10-CM | POA: Diagnosis not present

## 2018-12-26 DIAGNOSIS — Z3480 Encounter for supervision of other normal pregnancy, unspecified trimester: Secondary | ICD-10-CM

## 2018-12-26 DIAGNOSIS — Z3A23 23 weeks gestation of pregnancy: Secondary | ICD-10-CM | POA: Diagnosis not present

## 2018-12-26 DIAGNOSIS — B349 Viral infection, unspecified: Secondary | ICD-10-CM | POA: Diagnosis not present

## 2018-12-26 DIAGNOSIS — J111 Influenza due to unidentified influenza virus with other respiratory manifestations: Secondary | ICD-10-CM | POA: Insufficient documentation

## 2018-12-26 DIAGNOSIS — O99512 Diseases of the respiratory system complicating pregnancy, second trimester: Secondary | ICD-10-CM | POA: Insufficient documentation

## 2018-12-26 DIAGNOSIS — O99352 Diseases of the nervous system complicating pregnancy, second trimester: Secondary | ICD-10-CM | POA: Diagnosis not present

## 2018-12-26 DIAGNOSIS — O162 Unspecified maternal hypertension, second trimester: Secondary | ICD-10-CM | POA: Diagnosis not present

## 2018-12-26 LAB — URINALYSIS, ROUTINE W REFLEX MICROSCOPIC
Bilirubin Urine: NEGATIVE
Glucose, UA: NEGATIVE mg/dL
Hgb urine dipstick: NEGATIVE
Ketones, ur: NEGATIVE mg/dL
NITRITE: NEGATIVE
Protein, ur: NEGATIVE mg/dL
Specific Gravity, Urine: 1.015 (ref 1.005–1.030)
pH: 6.5 (ref 5.0–8.0)

## 2018-12-26 LAB — INFLUENZA PANEL BY PCR (TYPE A & B)
INFLAPCR: NEGATIVE
Influenza B By PCR: POSITIVE — AB

## 2018-12-26 LAB — URINALYSIS, MICROSCOPIC (REFLEX)

## 2018-12-26 MED ORDER — METOCLOPRAMIDE HCL 5 MG/ML IJ SOLN: 10.0000 mg | Freq: Once | INTRAMUSCULAR | Status: DC

## 2018-12-26 MED ORDER — ACETAMINOPHEN 500 MG PO TABS: 1000.0000 mg | ORAL_TABLET | Freq: Once | ORAL | Status: DC

## 2018-12-26 MED ORDER — OSELTAMIVIR PHOSPHATE 6 MG/ML PO SUSR: 75.0000 mg | Freq: Two times a day (BID) | ORAL | 0 refills | Status: AC

## 2018-12-26 MED ORDER — DEXAMETHASONE SODIUM PHOSPHATE 10 MG/ML IJ SOLN: 10.0000 mg | Freq: Once | INTRAMUSCULAR | Status: DC

## 2018-12-26 MED ORDER — ACETAMINOPHEN 160 MG/5ML PO SOLN
650.0000 mg | Freq: Once | ORAL | Status: AC
  Administered 2018-12-26: 650 mg via ORAL
  Filled 2018-12-26: qty 20.3

## 2018-12-26 MED ORDER — SODIUM CHLORIDE 0.9 % IV SOLN: INTRAVENOUS | Status: DC

## 2018-12-26 MED ORDER — DIPHENHYDRAMINE HCL 50 MG/ML IJ SOLN: 25.0000 mg | Freq: Once | INTRAMUSCULAR | Status: DC

## 2018-12-26 NOTE — MAU Note (Addendum)
Pt c/o fever. Hasn't taken her temp. Having cough, congestion, ear hurting. Having chills. All this started last night. Asked pt if she had taken the Tamiflu and she said she didn't because "they didn't mix it right". States she was told to only take it if she had symptoms and she wasn't having symptoms.  Having migraines but hasn't taken anything for it. Said she can't take pills and says she was told that if she can't take the pills she may need the IV fluids that she had before.

## 2018-12-26 NOTE — MAU Note (Signed)
Tried to get IV on pt, Iv went in but wouldn't advance. Pt refused to get stuck again.

## 2018-12-26 NOTE — MAU Provider Note (Signed)
History     CSN: [id number]  Arrival date and time: 12/26/18 1721   First Provider Initiated Contact with Patient 12/26/18 1801      Chief Complaint  Patient presents with  . Fever   HPI  Denise [name] is a 28 y.o. G2P1001 at [redacted]w[redacted]d who presents to MAU today with complaint of fever, headache and congestion since yesterday. She had a flu exposure and was given Tamiflu, but states it was the wrong dosage so she didn't take it. She also states she was told not to take any medications after her cerclage, so she hasn't taken Tylenol for her headache or fever. She denies abdominal pain, contractions, blurred vision, LE edema, vaginal bleeding or LOF. She reports normal fetal movement.    OB History    Gravida  2   Para  1   Term  1   Preterm      AB      Living  1     SAB      TAB      Ectopic      Multiple      Live Births  1           Past Medical History:  Diagnosis Date  . Allergy   . Arthritis   . Asthma   . Gestational diabetes   . Hx of chlamydia infection 2017  . Pregnancy induced hypertension    Pre-E  . Seasonal allergies   . Trichomonas 2017    Past Surgical History:  Procedure Laterality Date  . CERVICAL CERCLAGE N/A 11/23/2018   Procedure: CERCLAGE CERVICAL;  Surgeon: Reva Bores, MD;  Location: [facility] BIRTHING SUITES;  Service: Gynecology;  Laterality: N/A;    Family History  Problem Relation Age of Onset  . Arthritis Mother   . Diabetes Mother   . Diabetes Maternal Grandmother   . Arthritis Maternal Grandmother     Social History   Tobacco Use  . Smoking status: Never Smoker  . Smokeless tobacco: Never Used  Substance Use Topics  . Alcohol use: No  . Drug use: No    Allergies: No Known Allergies  Medications Prior to Admission  Medication Sig Dispense Refill Last Dose  . ACCU-CHEK FASTCLIX LANCETS MISC 1 Units by Percutaneous route 4 (four) times daily. 100 each 12 Taking  . glucose blood (ACCU-CHEK GUIDE) test strip  Use as instructed 100 each 12 Taking  . oseltamivir (TAMIFLU) 6 MG/ML SUSR suspension Take 12.5 mLs (75 mg total) by mouth 2 (two) times daily for 5 days. 125 mL 0   . pantoprazole (PROTONIX) 40 MG tablet Take 1 tablet (40 mg total) by mouth daily. (Patient not taking: Reported on 12/14/2018) 30 tablet 3 Not Taking  . Prenatal Vit-Fe Fumarate-FA (MULTIVITAMIN-PRENATAL) 27-0.8 MG TABS tablet Take 1 tablet by mouth daily at 12 noon.   Taking  . progesterone (PROMETRIUM) 200 MG capsule Place 1 capsule (200 mg total) vaginally daily. 30 capsule 4 Taking    Review of Systems  Constitutional: Positive for chills and fever.  HENT: Positive for congestion, sinus pressure and sore throat.   Eyes: Negative for visual disturbance.  Cardiovascular: Negative for leg swelling.  Gastrointestinal: Negative for abdominal pain.  Genitourinary: Negative for vaginal bleeding and vaginal discharge.  Musculoskeletal: Positive for myalgias.  Neurological: Positive for headaches.   Physical Exam   Blood pressure 120/78, pulse (!) 141, temperature (!) 100.4 F (38 C), temperature source Oral, resp. rate 18, last menstrual  period 07/08/2018, SpO2 100 %.  Physical Exam  Nursing note and vitals reviewed. Constitutional: She is oriented to person, place, and time. She appears well-developed and well-nourished. No distress.  HENT:  Head: Normocephalic and atraumatic.  Neck: Neck supple.  Cardiovascular: Regular rhythm and normal heart sounds. Tachycardia present.  No murmur heard. Respiratory: Effort normal and breath sounds normal. No respiratory distress. She has no wheezes.  GI: Soft. She exhibits no distension and no mass. There is no abdominal tenderness. There is no rebound and no guarding.  Lymphadenopathy:       Head (right side): No submental, no submandibular and no tonsillar adenopathy present.       Head (left side): No submental, no submandibular and no tonsillar adenopathy present.    She has no  cervical adenopathy.  Neurological: She is alert and oriented to person, place, and time.  Skin: Skin is warm and dry. No erythema.  Psychiatric: She has a normal mood and affect.   Results for orders placed or performed during the hospital encounter of 12/26/18 (from the past 24 hour(s))  Urinalysis, Routine w reflex microscopic     Status: Abnormal   Collection Time: 12/26/18  5:38 PM  Result Value Ref Range   Color, Urine YELLOW YELLOW   APPearance CLEAR CLEAR   Specific Gravity, Urine 1.015 1.005 - 1.030   pH 6.5 5.0 - 8.0   Glucose, UA NEGATIVE NEGATIVE mg/dL   Hgb urine dipstick NEGATIVE NEGATIVE   Bilirubin Urine NEGATIVE NEGATIVE   Ketones, ur NEGATIVE NEGATIVE mg/dL   Protein, ur NEGATIVE NEGATIVE mg/dL   Nitrite NEGATIVE NEGATIVE   Leukocytes, UA LARGE (A) NEGATIVE  Urinalysis, Microscopic (reflex)     Status: Abnormal   Collection Time: 12/26/18  5:38 PM  Result Value Ref Range   RBC / HPF 0-5 0 - 5 RBC/hpf   WBC, UA 21-50 0 - 5 WBC/hpf   Bacteria, UA RARE (A) NONE SEEN   Squamous Epithelial / LPF 11-20 0 - 5   Mucus PRESENT    Fetal Monitoring: Baseline: 160 bpm Variability: moderate Accelerations: 10 x 10 Decelerations: none Contractions: none  MAU Course  Procedures None  MDM UA today and flu swab Offered headache cocktail, unable to obtain IV with first stick, patient refused further sticks and could not receive recommended IV fluids or medications for migraine headache 650 mg PO liquid Tylenol given  Discussed patient with Dr. Earlene Plater. Ok to offer headache cocktail and Tylenol despite patients report that Dr.Pratt told her not to take any medications.   Assessment and Plan  A: SIUP at [redacted]w[redacted]d Influenza Migraine   P:  Discharge home Rx for Tamiflu sent to patient's pharmacy Second trimester precautions discussed Patient advised to follow-up with [facility] as scheduled for routine prenatal care or sooner PRN Patient may return to MAU as needed or if  her condition were to change or worsen  [name], PA-C 12/26/2018, 6:01 PM

## 2018-12-26 NOTE — Discharge Instructions (Signed)

## 2019-01-01 ENCOUNTER — Other Ambulatory Visit: Payer: Self-pay

## 2019-01-01 ENCOUNTER — Inpatient Hospital Stay (HOSPITAL_COMMUNITY)
Admission: AD | Admit: 2019-01-01 | Discharge: 2019-01-03 | DRG: 768 | Disposition: A | Discharge status: Home / Self Care | Payer: Medicaid Other | Attending: Obstetrics and Gynecology | Admitting: Obstetrics and Gynecology

## 2019-01-01 ENCOUNTER — Encounter (HOSPITAL_COMMUNITY): Payer: Self-pay

## 2019-01-01 DIAGNOSIS — O24420 Gestational diabetes mellitus in childbirth, diet controlled: Secondary | ICD-10-CM | POA: Diagnosis present

## 2019-01-01 DIAGNOSIS — O3432 Maternal care for cervical incompetence, second trimester: Secondary | ICD-10-CM | POA: Diagnosis present

## 2019-01-01 DIAGNOSIS — J45909 Unspecified asthma, uncomplicated: Secondary | ICD-10-CM | POA: Diagnosis present

## 2019-01-01 DIAGNOSIS — Z3A24 24 weeks gestation of pregnancy: Secondary | ICD-10-CM | POA: Diagnosis not present

## 2019-01-01 DIAGNOSIS — Z8751 Personal history of pre-term labor: Secondary | ICD-10-CM | POA: Insufficient documentation

## 2019-01-01 DIAGNOSIS — O42912 Preterm premature rupture of membranes, unspecified as to length of time between rupture and onset of labor, second trimester: Secondary | ICD-10-CM | POA: Diagnosis not present

## 2019-01-01 DIAGNOSIS — N883 Incompetence of cervix uteri: Secondary | ICD-10-CM | POA: Diagnosis not present

## 2019-01-01 DIAGNOSIS — O0990 Supervision of high risk pregnancy, unspecified, unspecified trimester: Secondary | ICD-10-CM

## 2019-01-01 DIAGNOSIS — O99824 Streptococcus B carrier state complicating childbirth: Secondary | ICD-10-CM | POA: Diagnosis present

## 2019-01-01 DIAGNOSIS — Z8619 Personal history of other infectious and parasitic diseases: Secondary | ICD-10-CM

## 2019-01-01 DIAGNOSIS — O9952 Diseases of the respiratory system complicating childbirth: Secondary | ICD-10-CM | POA: Diagnosis present

## 2019-01-01 DIAGNOSIS — O24419 Gestational diabetes mellitus in pregnancy, unspecified control: Secondary | ICD-10-CM | POA: Diagnosis not present

## 2019-01-01 DIAGNOSIS — O411220 Chorioamnionitis, second trimester, not applicable or unspecified: Secondary | ICD-10-CM | POA: Diagnosis present

## 2019-01-01 DIAGNOSIS — O162 Unspecified maternal hypertension, second trimester: Secondary | ICD-10-CM

## 2019-01-01 DIAGNOSIS — O26873 Cervical shortening, third trimester: Secondary | ICD-10-CM | POA: Diagnosis present

## 2019-01-01 DIAGNOSIS — O26872 Cervical shortening, second trimester: Secondary | ICD-10-CM | POA: Diagnosis present

## 2019-01-01 DIAGNOSIS — O134 Gestational [pregnancy-induced] hypertension without significant proteinuria, complicating childbirth: Secondary | ICD-10-CM | POA: Diagnosis present

## 2019-01-01 DIAGNOSIS — O169 Unspecified maternal hypertension, unspecified trimester: Secondary | ICD-10-CM | POA: Diagnosis present

## 2019-01-01 DIAGNOSIS — Z3480 Encounter for supervision of other normal pregnancy, unspecified trimester: Secondary | ICD-10-CM

## 2019-01-01 DIAGNOSIS — O42012 Preterm premature rupture of membranes, onset of labor within 24 hours of rupture, second trimester: Secondary | ICD-10-CM | POA: Diagnosis not present

## 2019-01-01 DIAGNOSIS — O42919 Preterm premature rupture of membranes, unspecified as to length of time between rupture and onset of labor, unspecified trimester: Secondary | ICD-10-CM | POA: Diagnosis present

## 2019-01-01 LAB — TYPE AND SCREEN
ABO/RH(D): B POS
ANTIBODY SCREEN: NEGATIVE

## 2019-01-01 LAB — CBC
HCT: 37.5 % (ref 36.0–46.0)
Hemoglobin: 12.2 g/dL (ref 12.0–15.0)
MCH: 24.5 pg — ABNORMAL LOW (ref 26.0–34.0)
MCHC: 32.5 g/dL (ref 30.0–36.0)
MCV: 75.5 fL — ABNORMAL LOW (ref 80.0–100.0)
NRBC: 0 % (ref 0.0–0.2)
Platelets: 238 10*3/uL (ref 150–400)
RBC: 4.97 MIL/uL (ref 3.87–5.11)
RDW: 14.9 % (ref 11.5–15.5)
WBC: 7.1 10*3/uL (ref 4.0–10.5)

## 2019-01-01 MED ORDER — SOD CITRATE-CITRIC ACID 500-334 MG/5ML PO SOLN: 30.0000 mL | ORAL | Status: DC | PRN

## 2019-01-01 MED ORDER — ONDANSETRON HCL 4 MG/2ML IJ SOLN: 4.0000 mg | Freq: Four times a day (QID) | INTRAMUSCULAR | Status: DC | PRN

## 2019-01-01 MED ORDER — PRENATAL MULTIVITAMIN CH: 1.0000 | ORAL_TABLET | Freq: Every day | ORAL | Status: DC

## 2019-01-01 MED ORDER — MAGNESIUM SULFATE 40 G IN LACTATED RINGERS - SIMPLE
2.0000 g/h | INTRAVENOUS | Status: DC
  Filled 2019-01-01: qty 500

## 2019-01-01 MED ORDER — LIDOCAINE HCL (PF) 1 % IJ SOLN
30.0000 mL | INTRAMUSCULAR | Status: DC | PRN
  Filled 2019-01-01: qty 30

## 2019-01-01 MED ORDER — MAGNESIUM SULFATE 40 G IN LACTATED RINGERS - SIMPLE: 2.0000 g/h | INTRAVENOUS | Status: DC

## 2019-01-01 MED ORDER — SODIUM CHLORIDE 0.9 % IV SOLN
2.0000 g | Freq: Four times a day (QID) | INTRAVENOUS | Status: DC
  Administered 2019-01-01: 2 g via INTRAVENOUS
  Filled 2019-01-01: qty 2000
  Filled 2019-01-01: qty 2
  Filled 2019-01-01: qty 2000

## 2019-01-01 MED ORDER — ZOLPIDEM TARTRATE 5 MG PO TABS: 5.0000 mg | ORAL_TABLET | Freq: Every evening | ORAL | Status: DC | PRN

## 2019-01-01 MED ORDER — MAGNESIUM SULFATE BOLUS VIA INFUSION
6.0000 g | Freq: Once | INTRAVENOUS | Status: AC
  Administered 2019-01-01: 6 g via INTRAVENOUS
  Filled 2019-01-01: qty 500

## 2019-01-01 MED ORDER — LACTATED RINGERS IV SOLN
INTRAVENOUS | Status: DC
  Administered 2019-01-01: 21:00:00 via INTRAVENOUS

## 2019-01-01 MED ORDER — AZITHROMYCIN 250 MG PO TABS
500.0000 mg | ORAL_TABLET | Freq: Every day | ORAL | Status: DC
  Filled 2019-01-01 (×2): qty 2

## 2019-01-01 MED ORDER — BETAMETHASONE SOD PHOS & ACET 6 (3-3) MG/ML IJ SUSP
12.0000 mg | Freq: Once | INTRAMUSCULAR | Status: DC
  Filled 2019-01-01: qty 2

## 2019-01-01 MED ORDER — TERBUTALINE SULFATE 1 MG/ML IJ SOLN
0.2500 mg | Freq: Once | INTRAMUSCULAR | Status: AC
  Administered 2019-01-01: 0.25 mg via SUBCUTANEOUS

## 2019-01-01 MED ORDER — FENTANYL CITRATE (PF) 100 MCG/2ML IJ SOLN: 50.0000 ug | INTRAMUSCULAR | Status: DC | PRN

## 2019-01-01 MED ORDER — DOCUSATE SODIUM 100 MG PO CAPS: 100.0000 mg | ORAL_CAPSULE | Freq: Every day | ORAL | Status: DC

## 2019-01-01 MED ORDER — AMOXICILLIN 500 MG PO CAPS: 500.0000 mg | ORAL_CAPSULE | Freq: Three times a day (TID) | ORAL | Status: DC

## 2019-01-01 MED ORDER — TERBUTALINE SULFATE 1 MG/ML IJ SOLN
INTRAMUSCULAR | Status: AC
  Administered 2019-01-01: 0.25 mg via SUBCUTANEOUS
  Filled 2019-01-01: qty 1

## 2019-01-01 MED ORDER — MAGNESIUM SULFATE BOLUS VIA INFUSION: 6.0000 g | Freq: Once | INTRAVENOUS | Status: DC

## 2019-01-01 MED ORDER — ACETAMINOPHEN 325 MG PO TABS: 650.0000 mg | ORAL_TABLET | ORAL | Status: DC | PRN

## 2019-01-01 MED ORDER — SODIUM CHLORIDE 0.9 % IV SOLN
500.0000 mg | Freq: Once | INTRAVENOUS | Status: AC
  Administered 2019-01-01: 500 mg via INTRAVENOUS
  Filled 2019-01-01: qty 500

## 2019-01-01 MED ORDER — OXYTOCIN BOLUS FROM INFUSION
500.0000 mL | Freq: Once | INTRAVENOUS | Status: AC
  Administered 2019-01-01: 500 mL via INTRAVENOUS

## 2019-01-01 MED ORDER — CALCIUM CARBONATE ANTACID 500 MG PO CHEW: 2.0000 | CHEWABLE_TABLET | ORAL | Status: DC | PRN

## 2019-01-01 MED ORDER — FENTANYL CITRATE (PF) 100 MCG/2ML IJ SOLN: 100.0000 ug | INTRAMUSCULAR | Status: DC | PRN

## 2019-01-01 MED ORDER — FENTANYL CITRATE (PF) 100 MCG/2ML IJ SOLN
INTRAMUSCULAR | Status: AC
  Administered 2019-01-01: 100 ug
  Filled 2019-01-01: qty 2

## 2019-01-01 MED ORDER — OXYTOCIN 40 UNITS IN NORMAL SALINE INFUSION - SIMPLE MED
2.5000 [IU]/h | INTRAVENOUS | Status: DC
  Filled 2019-01-01: qty 1000

## 2019-01-01 MED ORDER — LACTATED RINGERS IV SOLN: 500.0000 mL | INTRAVENOUS | Status: DC | PRN

## 2019-01-01 MED ORDER — BETAMETHASONE SOD PHOS & ACET 6 (3-3) MG/ML IJ SUSP
12.0000 mg | INTRAMUSCULAR | Status: DC
  Administered 2019-01-01: 12 mg via INTRAMUSCULAR
  Filled 2019-01-01: qty 2

## 2019-01-01 NOTE — MAU Note (Signed)
NICU notified of pt's admission and status.  

## 2019-01-01 NOTE — Progress Notes (Signed)
5 4x18  5 9x9  10 Instruments   2 Injectables  Setup for Philipp Deputy

## 2019-01-01 NOTE — MAU Note (Signed)
CTX since this AM.  Has had light pink discharge starting tonight.  No LOF.  Reports feeling baby kick on the way here but overall less movement than usual.

## 2019-01-01 NOTE — Discharge Summary (Signed)
Postpartum Discharge Summary     Patient Name: Denise [name] DOB: Aug 23, 1991 MRN: [id number]  Date of admission: 01/01/2019 Delivering Provider: Gwenevere Abbot   Date of discharge: 01/03/2019  Admitting diagnosis: ctx Intrauterine pregnancy: [redacted]w[redacted]d     Secondary diagnosis:  Principal Problem:   Preterm premature rupture of membranes (PPROM) delivered Active Problems:   Short cervix with cervical cerclage, antepartum, second trimester   Supervision of high-risk pregnancy   Gestational diabetes mellitus (GDM) in second trimester   Hypertension complicating pregnancy   Chorioamnionitis in second trimester   Group B Streptococcus carrier, delivered  Additional problems: Recently diagnosed with and treated for Influenza B on 12/26/2018     Discharge diagnosis: Preterm Pregnancy Delivered, Gestational Hypertension and GDM A1                                                                                                Post partum procedures:None  Augmentation: None  Complications: Intrauterine Inflammation or infection (Chorioamniotis)  Hospital course:  Onset of Labor With Vaginal Delivery     28 y.o. yo G2P1001 at [redacted]w[redacted]d was admitted in active preterm labor  after PPROM on 01/01/2019.  Her cerclage has to be removed given progressing preterm labor. She did receive one dose of betamethasone, one dose of latency antibiotics (Ampicillin and Azithromycin) and magnesium sulfate; also was seen by Neonatology prior to deliver.   Patient had an uncomplicated labor course as follows:  Membrane Rupture Time/Date: 3:30 PM ,01/01/2019   Intrapartum Procedures: Episiotomy: None [1]                                         Lacerations:  None [1]  Patient had a delivery of a Viable infant and on 01/01/2019 approximately seven hours after PPROM, please see delivery note for further details.  Information for the patient's newborn:  Leaira, Arwine [[id number]]  Delivery Method: Vaginal,  Spontaneous(Filed from Delivery Summary)  Patient had an uncomplicated postpartum course.  She is ambulating, tolerating a regular diet, passing flatus, and urinating well. Patient is discharged home in stable condition on 01/03/19.  Magnesium Sulfate recieved: Yes BMZ received: Yes  Physical exam  Vitals:   01/02/19 1749 01/02/19 1942 01/03/19 0523 01/03/19 0921  BP:  (!) 127/92 122/77 117/74  Pulse:  90 81 80  Resp:  16 16 16   Temp: 97.8 F (36.6 C) 98 F (36.7 C) 97.7 F (36.5 C) 98.1 F (36.7 C)  TempSrc: Axillary Oral Oral Oral  SpO2:  98% 100% 100%  Weight:      Height:       General: alert, cooperative and no distress Lochia: appropriate Uterine Fundus: firm Incision: N/A DVT Evaluation: No evidence of DVT seen on physical exam. Labs: Lab Results  Component Value Date   WBC 7.1 01/01/2019   HGB 12.2 01/01/2019   HCT 37.5 01/01/2019   MCV 75.5 (L) 01/01/2019   PLT 238 01/01/2019   CMP Latest Ref Rng & Units 12/14/2018  Glucose  65 - 99 mg/dL 607(P)  BUN 6 - 20 mg/dL 8  Creatinine 7.10 - 6.26 mg/dL 9.48  Sodium 546 - 270 mmol/L 136  Potassium 3.5 - 5.2 mmol/L 4.4  Chloride 96 - 106 mmol/L 105  CO2 20 - 29 mmol/L 18(L)  Calcium 8.7 - 10.2 mg/dL 9.6  Total Protein 6.0 - 8.5 g/dL 6.8  Total Bilirubin 0.0 - 1.2 mg/dL <3.5  Alkaline Phos 39 - 117 IU/L 70  AST 0 - 40 IU/L 17  ALT 0 - 32 IU/L 24    Discharge instruction: per After Visit Summary and "Baby and Me Booklet".  After visit meds:  Allergies as of 01/03/2019   No Known Allergies     Medication List    STOP taking these medications   ACCU-CHEK FASTCLIX LANCETS Misc   glucose blood test strip Commonly known as:  ACCU-CHEK GUIDE   progesterone 200 MG capsule Commonly known as:  PROMETRIUM     TAKE these medications   albuterol 108 (90 Base) MCG/ACT inhaler Commonly known as:  PROVENTIL HFA;VENTOLIN HFA Inhale 2 puffs into the lungs every 4 (four) hours as needed for wheezing.   ibuprofen  100 MG/5ML suspension Commonly known as:  ADVIL,MOTRIN Take 30 mLs (600 mg total) by mouth every 6 (six) hours as needed for fever, mild pain or moderate pain.   prenatal multivitamin Tabs tablet Take 1 tablet by mouth daily at 12 noon.       Diet: routine diet  Activity: Advance as tolerated. Pelvic rest for 6 weeks.   Followup Appointments needed: 1-2 weeks: Mood and BP check 6  weeks: PP visit and 2 hr GTT  Newborn Data: Live born female  Birth Weight: 730 g  APGAR:4 , 8  Newborn Delivery   Birth date/time:  01/01/2019 22:35:00 Delivery type:  Vaginal, Spontaneous     Baby Feeding: Bottle and Breast Disposition:NICU   01/03/2019 Jaynie Collins, MD

## 2019-01-01 NOTE — MAU Provider Note (Signed)
History     CSN: [id number]  Arrival date and time: 01/01/19 1924   None     Chief Complaint  Patient presents with  . Contractions   Denise [name] is a 28 y.o. G2P1001 at [redacted]w[redacted]d who presents for Contractions.  She states she started feeling pain and discomfort this morning upon waking. She states that the pain has worsened throughout the day, but admits that she did not rest and instead dropped her child off at school and went to [place].  Patient denies vaginal bleeding and states she had LOF upon arrival.  She endorses fetal movement.      OB History    Gravida  2   Para  1   Term  1   Preterm      AB      Living  1     SAB      TAB      Ectopic      Multiple      Live Births  1           Past Medical History:  Diagnosis Date  . Allergy   . Arthritis   . Asthma   . Gestational diabetes   . Hx of chlamydia infection 2017  . Pregnancy induced hypertension    Pre-E  . Seasonal allergies   . Trichomonas 2017    Past Surgical History:  Procedure Laterality Date  . CERVICAL CERCLAGE N/A 11/23/2018   Procedure: CERCLAGE CERVICAL;  Surgeon: Reva Bores, MD;  Location: [facility] BIRTHING SUITES;  Service: Gynecology;  Laterality: N/A;    Family History  Problem Relation Age of Onset  . Arthritis Mother   . Diabetes Mother   . Diabetes Maternal Grandmother   . Arthritis Maternal Grandmother     Social History   Tobacco Use  . Smoking status: Never Smoker  . Smokeless tobacco: Never Used  Substance Use Topics  . Alcohol use: No  . Drug use: No    Allergies: No Known Allergies  Medications Prior to Admission  Medication Sig Dispense Refill Last Dose  . ACCU-CHEK FASTCLIX LANCETS MISC 1 Units by Percutaneous route 4 (four) times daily. 100 each 12 Taking  . glucose blood (ACCU-CHEK GUIDE) test strip Use as instructed 100 each 12 Taking  . pantoprazole (PROTONIX) 40 MG tablet Take 1 tablet (40 mg total) by mouth daily. (Patient not taking:  Reported on 12/14/2018) 30 tablet 3 Not Taking  . Prenatal Vit-Fe Fumarate-FA (MULTIVITAMIN-PRENATAL) 27-0.8 MG TABS tablet Take 1 tablet by mouth daily at 12 noon.   Taking  . progesterone (PROMETRIUM) 200 MG capsule Place 1 capsule (200 mg total) vaginally daily. 30 capsule 4 Taking    Review of Systems  Constitutional: Negative for chills and fever.  Gastrointestinal: Positive for abdominal pain. Negative for nausea and vomiting.  Genitourinary: Negative for dysuria.   Physical Exam   Blood pressure 115/83, pulse (!) 116, temperature 98.4 F (36.9 C), resp. rate 20, height 5\' 6"  (1.676 m), weight 114.8 kg, last menstrual period 07/08/2018, SpO2 99 %.  Physical Exam  Constitutional: She is oriented to person, place, and time. She appears well-developed and well-nourished.  HENT:  Head: Normocephalic and atraumatic.  Eyes: Conjunctivae are normal.  Neck: Normal range of motion.  Cardiovascular: Normal rate.  Respiratory: Effort normal.  GI: Soft.  Genitourinary:    Vaginal discharge and bleeding present.  There is bleeding in the vagina.    Genitourinary Comments: Sterile Speculum Exam: -Introitus:  Appears -Vaginal Vault: Moderate amt pinkish watery discharge in vault -Cervix: Appears ~3cm, Fetal hair apparent. Sutures noted at Anterior Lip  -Bimanual Exam: Unable to Assess  Cerclage removed by Dr. Sherryl Manges   Musculoskeletal: Normal range of motion.  Neurological: She is alert and oriented to person, place, and time.  Skin: Skin is warm and dry.  Psychiatric: She has a normal mood and affect. Her behavior is normal.    MAU Course  Procedures Results for orders placed or performed during the hospital encounter of 01/01/19 (from the past 24 hour(s))  CBC     Status: Abnormal   Collection Time: 01/01/19  8:01 PM  Result Value Ref Range   WBC 7.1 4.0 - 10.5 K/uL   RBC 4.97 3.87 - 5.11 MIL/uL   Hemoglobin 12.2 12.0 - 15.0 g/dL   HCT 56.2 56.3 - 89.3 %   MCV 75.5 (L)  80.0 - 100.0 fL   MCH 24.5 (L) 26.0 - 34.0 pg   MCHC 32.5 30.0 - 36.0 g/dL   RDW 73.4 28.7 - 68.1 %   Platelets 238 150 - 400 K/uL   nRBC 0.0 0.0 - 0.2 %  Type and screen [facility]     Status: None   Collection Time: 01/01/19  8:01 PM  Result Value Ref Range   ABO/RH(D) B POS    Antibody Screen NEG    Sample Expiration      01/04/2019 Performed at [facility], [address], [place], [place] [address]     MDM Pelvic Exam Labs BS [place] Assessment and Plan  28 year old G2P1001 at 24.2 weeks Contractions Rescue Cerclage   -Exam findings discussed -Start IV and draw labs -BS [place] confirms cephalic -Fern Negative -Dr. Sherryl Manges notified and informed of findings *Advised to obtain Amnisure and will assess for need to remove sutures. -Unable to perform Amnisure due to blood in specimen.  8:23 PM -Dr. Emelda Fear at bedside; cerclage removed -Give Terbutaline 0.25mg  IM now -Start Magnesium 6/2 now -Will admit to [facility] with admission orders per protocol. -Orders placed  Cherre Robins MSN, CNM 01/01/2019, 8:09 PM

## 2019-01-01 NOTE — Consult Note (Signed)
Neonatology Consult to Antenatal Patient:  I was asked by Dr. Emelda Fear to see this patient in order to provide antenatal counseling due to SROM and onset of preterm labor at 24 2/7 weeks.  Ms. Cirrincione was admitted this evening at 24 2/7 weeks after having onset of labor at about 0300 today. She has diet-controlled GDM, chronic HTN, obesity, and  incompetent cervix with cerclage placement. She has been on progesterone (taking intermittently), a baby aspirin, and an albuterol inhaler. She had PCR-positive Influenza B on 2/8, treated with tamiflu. She presented 3 cm dilated and completely effaced this evening, and the cerclage had to be removed. She is currently having frequent, painful contractions. She is getting BMZ, Magnesium sulfate, Terbutaline, and IV Ampicillin and Azithromycin. The baby is female, her second child.   I spoke with the patient and the baby's father. Discussion was brief due to patient's level of pain with contractions. We discussed the worst case of delivery in the next 1-2 days, including usual DR management, expected respiratory complications and need for support, IV access, LOS, Mortality and Morbidity, and long term outcomes. She did not have any questions at this time. I offered a NICU tour to the father of the baby, but he declined at this time. I will have a more extended conversation with them when the mother is more comfortable.  Thank you for asking me to see this patient.  Doretha Sou, MD Neonatologist  The total length of face-to-face or floor/unit time for this encounter was 20 minutes. Counseling and/or coordination of care was 12 minutes of the above.

## 2019-01-01 NOTE — H&P (Signed)
FACULTY PRACTICE ANTEPARTUM ADMISSION HISTORY AND PHYSICAL NOTE   History of Present Illness: Denise [name] is a 28 y.o. G2P1001 at [redacted]w[redacted]d admitted for Preterm labor, PROM. She presented to MAU after reported contractions "all Day" presented with active contractions, with watery fluid noted upon arrival to MAU, bloody .Evaluation by MAU provider showed speculum exam noted at 3/100/ with vertex firmly applied to the cervix.  FHR is 150 with variability, no decelerations, . Patient reports uterine contraction  activity as regular, every 1-2 minutes. Patient reports  vaginal bleeding as watery fluid with blood and small clots. Patient describes fluid per vagina as Other watery clear with blood. Fetal presentation is cephalic.  Patient Active Problem List   Diagnosis Date Noted  . Preterm premature rupture of membranes with onset of labor within 24 hours of rupture 01/01/2019  . Preterm premature rupture of membranes 01/01/2019  . Hypertension complicating pregnancy 12/14/2018  . Gestational diabetes mellitus (GDM) in second trimester 10/22/2018  . Supervision of other normal pregnancy, antepartum 10/19/2018  . Short cervix during pregnancy in second trimester 07/28/2013  . 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 Asthma 04/29/2012    Past Medical History:  Diagnosis Date  . Allergy   . Arthritis   . Asthma   . Gestational diabetes   . Hx of chlamydia infection 2017  . Pregnancy induced hypertension    Pre-E  . Seasonal allergies   . Trichomonas 2017    Past Surgical History:  Procedure Laterality Date  . CERVICAL CERCLAGE N/A 11/23/2018   Procedure: CERCLAGE CERVICAL;  Surgeon: Reva Bores, MD;  Location: [facility];  Service: Gynecology;  Laterality: N/A;    OB History  Gravida Para Term Preterm AB Living  2 1 1     1   SAB TAB Ectopic Multiple Live Births          1    # Outcome Date GA Lbr Len/2nd Weight Sex Delivery Anes PTL Lv  2 Current           1 Term 10/28/13 [redacted]w[redacted]d  05:35 / 00:19 2880 g F Vag-Spont None  LIV     Birth Comments: Newborn Screen: Normal, Hgb, FA  NBS Barcode: [id number]    Social History   Socioeconomic History  . Marital status: Single    Spouse name: Not on file  . Number of children: 1  . Years of education: Not on file  . Highest education level: Not on file  Occupational History  . Occupation: Childcare  Social Needs  . Financial resource strain: Somewhat hard  . Food insecurity:    Worry: Sometimes true    Inability: Sometimes true  . Transportation needs:    Medical: No    Non-medical: No  Tobacco Use  . Smoking status: Never Smoker  . Smokeless tobacco: Never Used  Substance and Sexual Activity  . Alcohol use: No  . Drug use: No  . Sexual activity: Yes    Partners: Male    Birth control/protection: None    Comment: with monagamous partner   Lifestyle  . Physical activity:    Days per week: 3 days    Minutes per session: 30 min  . Stress: Not on file  Relationships  . Social connections:    Talks on phone: More than three times a week    Gets together: Twice a week    Attends religious service: More than 4 times per year    Active member of club or organization:  Yes    Attends meetings of clubs or organizations: More than 4 times per year    Relationship status: Never married  Other Topics Concern  . Not on file  Social History Narrative   Pt is from [place]. She lives in [place] with her mother and child.     Family History  Problem Relation Age of Onset  . Arthritis Mother   . Diabetes Mother   . Diabetes Maternal Grandmother   . Arthritis Maternal Grandmother     No Known Allergies  Medications Prior to Admission  Medication Sig Dispense Refill Last Dose  . ACCU-CHEK FASTCLIX LANCETS MISC 1 Units by Percutaneous route 4 (four) times daily. 100 each 12 Taking  . glucose blood (ACCU-CHEK GUIDE) test strip Use as instructed 100 each 12 Taking  . pantoprazole (PROTONIX) 40 MG tablet  Take 1 tablet (40 mg total) by mouth daily. (Patient not taking: Reported on 12/14/2018) 30 tablet 3 Not Taking  . Prenatal Vit-Fe Fumarate-FA (MULTIVITAMIN-PRENATAL) 27-0.8 MG TABS tablet Take 1 tablet by mouth daily at 12 noon.   Taking  . progesterone (PROMETRIUM) 200 MG capsule Place 1 capsule (200 mg total) vaginally daily. 30 capsule 4 Taking    Review of Systems - contracting thru the day. did not come in til 8 pm.  Vitals:  BP 127/83   Pulse (!) 125   Temp 99 F (37.2 C) (Oral)   Resp 20   Ht 5\' 6"  (1.676 m)   Wt 114.8 kg   LMP 07/08/2018   SpO2 100%   BMI 40.85 kg/m  Physical Examination: CONSTITUTIONAL: Well-developed, well-nourished female in no acute distress.  HENT:  Normocephalic, atraumatic, External right and left ear normal. Oropharynx is clear and moist EYES: Conjunctivae and EOM are normal. Pupils are equal, round, and reactive to light. No scleral icterus.  NECK: Normal range of motion, supple, no masses SKIN: Skin is warm and dry. No rash noted. Not diaphoretic. No erythema. No pallor. NEUROLGIC: Alert and oriented to person, place, and time. Normal reflexes, muscle tone coordination. No cranial nerve deficit noted. PSYCHIATRIC: Normal mood and affect. Normal behavior. Normal judgment and thought content. CARDIOVASCULAR: Normal heart rate noted, regular rhythm RESPIRATORY: Effort and breath sounds normal, no problems with respiration noted ABDOMEN: Soft, nontender, nondistended, gravid. MUSCULOSKELETAL: Normal range of motion. No edema and no tenderness. 2+ distal pulses.  Cervix: Mersilene cerclage is removed over concern of active labor and impact on vertex.: Evaluated by sterile speculum exam., Position: not felt, Dilation: 3cm and Thickness: thin, and found to be 3 cm/ 100%/-1 and fetal presentation is cephalic. Membranes:ruptured Fetal Monitoring:Baseline: 150 bpm, Variability: Fair (1-6 bpm), Accelerations: Non-reactive but appropriate for gestational age  and Decelerations: Absent Tocometer: contractions q 2 mins  Labs:  Results for orders placed or performed during the hospital encounter of 01/01/19 (from the past 24 hour(s))  CBC   Collection Time: 01/01/19  8:01 PM  Result Value Ref Range   WBC 7.1 4.0 - 10.5 K/uL   RBC 4.97 3.87 - 5.11 MIL/uL   Hemoglobin 12.2 12.0 - 15.0 g/dL   HCT 40.9 81.1 - 91.4 %   MCV 75.5 (L) 80.0 - 100.0 fL   MCH 24.5 (L) 26.0 - 34.0 pg   MCHC 32.5 30.0 - 36.0 g/dL   RDW 78.2 95.6 - 21.3 %   Platelets 238 150 - 400 K/uL   nRBC 0.0 0.0 - 0.2 %  Type and screen [facility]   Collection  Time: 01/01/19  8:01 PM  Result Value Ref Range   ABO/RH(D) B POS    Antibody Screen NEG    Sample Expiration      01/04/2019 Performed at [facility], [address] [place], [place] [address]     Imaging Studies: Kore[place] Mfm Ob Transvaginal  Result Date: 12/14/2018 ----------------------------------------------------------------------  OBSTETRICS REPORT                       (Signed Final 12/14/2018 08:23 am) ---------------------------------------------------------------------- Patient Info  ID #:       [id number]                          D.O.B.:  1991/06/18 (27 yrs)  Name:       Denise [name]                 Visit Date: 12/14/2018 07:48 am ---------------------------------------------------------------------- Performed By  Performed By:     Percell BostonHeather Waken          Ref. Address:     Faculty                    RDMS  Attending:        Noralee Spaceavi Shankar MD        [facility]  Referred By:      Catalina AntiguaPEGGY                    CONSTANT MD ---------------------------------------------------------------------- Orders   #  Description                          Code         Ordered By   1  US MFM OB TRANSVAGINAL               [id number]      PEGGY CONSTANT  ----------------------------------------------------------------------   #  Order #                    Accession #                 Episode #   1   [id number]                  [id number]                  [id number]  ---------------------------------------------------------------------- Indications   [redacted] weeks gestation of pregnancy                Z3A.21   Encounter for cervical length (Hx short cervix Z36.86   w/ last pregnancy) (cerclage placed 11/23/18)   Cervical shortening, second trimester          O26.872   Obesity complicating pregnancy, second         O99.212   trimester   Poor obstetric history: Previous               O09.299   preeclampsia / eclampsia/gestational HTN   Asthma                                         O99.89 j45.909   Gestational diabetes in pregnancy, diet        O24.410  controlled  ---------------------------------------------------------------------- Vital Signs                                                 Height:        5'6" ---------------------------------------------------------------------- Fetal Evaluation  Num Of Fetuses:         1  Fetal Heart Rate(bpm):  143  Cardiac Activity:       Observed  Presentation:           Cephalic ---------------------------------------------------------------------- OB History  Gravidity:    2         Term:   1  Living:       1 ---------------------------------------------------------------------- Gestational Age  LMP:           22w 5d        Date:  07/08/18                 EDD:   04/14/19  Best:          Larene [place] 5d     Det. ByMarcella Dubs         EDD:   04/21/19                                      (08/25/18) ---------------------------------------------------------------------- Cervix Uterus Adnexa  Cervix  Length:            0.4  cm.  Measured transvaginally. Cerclage visualized. Appears funnelled to  level of cerclage. ---------------------------------------------------------------------- Impression  Patient had rescue cerclage on 11/23/18 and she does not  have symptoms of vaginal bleeding or uterine contractions.  On previous ultrasound, the cervical canal was dilated and on  examination  (OR), the external os was open and the  membranes were visible.  Amniotic fluid is normal and good fetal heart activity is seen.  On transvaginal ultrasound, funneling was seen till the  cerclage. The residual-closed portion of the cervix measures  4 millimeters.  I reassured the patient that there is a marked improvement in  the findings.  Patient could not stay for fetal anatomy scan and would like  to return after her prenatal visit appointment. ---------------------------------------------------------------------- Recommendations  -Fetal anatomy scan later today. ----------------------------------------------------------------------                  Noralee Space, MD Electronically Signed Final Report   12/14/2018 08:23 am ----------------------------------------------------------------------  [place] Mfm Ob Comp + 14 Wk  Result Date: 12/14/2018 ----------------------------------------------------------------------  OBSTETRICS REPORT                       (Signed Final 12/14/2018 02:03 pm) ---------------------------------------------------------------------- Patient Info  ID #:       [id number]                          D.O.B.:  [birth year] (27 yrs)  Name:       Denise [name]                 Visit Date: 12/14/2018 12:28 pm ---------------------------------------------------------------------- Performed By  Performed By:     Marcellina Millin          Ref. Address:     Faculty  RDMS  Attending:        Noralee Space MD        Location:         [facility]  Referred By:      Catalina Antigua MD ---------------------------------------------------------------------- Orders   #  Description                          Code         Ordered By   1  [place] MFM OB COMP + 14 WK               76805.01     PEGGY CONSTANT  ----------------------------------------------------------------------   #  Order #                    Accession #                 Episode #   1  [id number]                   [id number]                  [id number]  ---------------------------------------------------------------------- Indications   Encounter for antenatal screening for          Z36.3   malformations   [redacted] weeks gestation of pregnancy                Z3A.21   Encounter for cervical length (Hx short cervix Z36.86   w/ last pregnancy) (cerclage placed 11/23/18)   Cervical shortening, second trimester          O26.872   Obesity complicating pregnancy, second         O99.212   trimester   Poor obstetric history: Previous               O09.299   preeclampsia / eclampsia/gestational HTN   Asthma                                         O99.89 j45.909   Gestational diabetes in pregnancy, diet        O24.410   controlled  ---------------------------------------------------------------------- Vital Signs                                                 Height:        5'6" ---------------------------------------------------------------------- Fetal Evaluation  Num Of Fetuses:         1  Fetal Heart Rate(bpm):  149  Cardiac Activity:       Observed  Presentation:           Cephalic  Placenta:               Anterior  P. Cord Insertion:      Visualized  Amniotic Fluid  AFI FV:      Within normal limits ---------------------------------------------------------------------- Biometry  BPD:      53.5  mm     G. Age:  22w 2d         69  %    CI:  72.78   %    70 - 86                                                          FL/HC:      18.2   %    18.4 - 20.2  HC:      199.4  mm     G. Age:  22w 1d         56  %    HC/AC:      1.19        1.06 - 1.25  AC:      167.2  mm     G. Age:  21w 5d         44  %    FL/BPD:     67.7   %    71 - 87  FL:       36.2  mm     G. Age:  21w 3d         33  %    FL/AC:      21.7   %    20 - 24  HUM:      36.1  mm     G. Age:  22w 4d         69  %  CER:      23.2  mm     G. Age:  21w 4d         46  %  Est. FW:     444  gm           1 lb     45  %  ---------------------------------------------------------------------- OB History  Gravidity:    2         Term:   1  Living:       1 ---------------------------------------------------------------------- Gestational Age  LMP:           22w 5d        Date:  07/08/18                 EDD:   04/14/19  U/S Today:     21w 6d                                        EDD:   04/20/19  Best:          [place]arene [place] 5d     Det. By:  Marcella Dubs         EDD:   04/21/19                                      (08/25/18) ---------------------------------------------------------------------- Anatomy  Cranium:               Appears normal         Aortic Arch:            Not well visualized  Cavum:                 Appears normal         Ductal Arch:  Not well visualized  Ventricles:            Appears normal         Diaphragm:              Appears normal  Choroid Plexus:        Appears normal         Stomach:                Appears normal, left                                                                        sided  Cerebellum:            Appears normal         Abdomen:                Appears normal  Posterior Fossa:       Appears normal         Abdominal Wall:         Appears nml (cord                                                                        insert, abd wall)  Nuchal Fold:           Not applicable (>20    Cord Vessels:           Appears normal ([redacted]                         wks GA)                                        vessel cord)  Face:                  Appears normal         Kidneys:                Appear normal                         (orbits and profile)  Lips:                  Appears normal         Bladder:                Appears normal  Thoracic:              Appears normal         Spine:                  Appears normal  Heart:                 Not well visualized    Upper Extremities:      Appears normal  RVOT:                  Not well visualized    Lower Extremities:      Appears normal  LVOT:                   Appears normal  Other:  Fetus appears to be a female. Heels and 5th digit visualized. Nasal          bone visualized. Technically difficult due to fetal position. ---------------------------------------------------------------------- Cervix Uterus Adnexa  Cervix  See previous ultrasound. ---------------------------------------------------------------------- Impression  Patient returned for fetal anatomy scan.  She had opted not to screen for fetal aneuploidies.  We performed fetal anatomy scan. No makers of  aneuploidies or fetal structural defects are seen. Fetal  biometry is consistent with her previously-established dates.  Amniotic fluid is normal and good fetal activity is seen.  Maternal obesity imposes limitations on the resolution of  images, and failure to detect fetal anomalies is more common  in obese pregnant women. A ---------------------------------------------------------------------- Recommendations  -An appointment was made for her to return in 4 weeks for  completion of fetal anatomy. ----------------------------------------------------------------------                  Noralee Space, MD Electronically Signed Final Report   12/14/2018 02:03 pm ----------------------------------------------------------------------    Assessment and Plan: Patient Active Problem List   Diagnosis Date Noted  . Preterm premature rupture of membranes with onset of labor within 24 hours of rupture 01/01/2019  . Preterm premature rupture of membranes 01/01/2019  . Hypertension complicating pregnancy 12/14/2018  . Gestational diabetes mellitus (GDM) in second trimester 10/22/2018  . Supervision of other normal pregnancy, antepartum 10/19/2018  . Short cervix during pregnancy in second trimester 07/28/2013  . 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 Asthma 04/29/2012   Admit to Labor and delivery Betamethasone x 2 doses Begun on Ampicillin 2 g q 6h, Azithromycin po  Magnesium sulfate for CP prophylaxis Will add  Indocin if labor not stopped by Magnesium sulfate   Tilda Burrow, MD  Attending Obstetrician & Gynecologist Faculty Practice, [facility]

## 2019-01-02 ENCOUNTER — Encounter (HOSPITAL_COMMUNITY): Payer: Self-pay

## 2019-01-02 LAB — RPR: RPR Ser Ql: NONREACTIVE

## 2019-01-02 LAB — CULTURE, BETA STREP (GROUP B ONLY)

## 2019-01-02 LAB — HEPATITIS B SURFACE ANTIGEN: Hepatitis B Surface Ag: NEGATIVE

## 2019-01-02 MED ORDER — TETANUS-DIPHTH-ACELL PERTUSSIS 5-2.5-18.5 LF-MCG/0.5 IM SUSP: 0.5000 mL | Freq: Once | INTRAMUSCULAR | Status: DC

## 2019-01-02 MED ORDER — WITCH HAZEL-GLYCERIN EX PADS: 1.0000 "application " | MEDICATED_PAD | CUTANEOUS | Status: DC | PRN

## 2019-01-02 MED ORDER — DIBUCAINE 1 % RE OINT: 1.0000 "application " | TOPICAL_OINTMENT | RECTAL | Status: DC | PRN

## 2019-01-02 MED ORDER — SIMETHICONE 80 MG PO CHEW: 80.0000 mg | CHEWABLE_TABLET | ORAL | Status: DC | PRN

## 2019-01-02 MED ORDER — DIPHENHYDRAMINE HCL 25 MG PO CAPS: 25.0000 mg | ORAL_CAPSULE | Freq: Four times a day (QID) | ORAL | Status: DC | PRN

## 2019-01-02 MED ORDER — IBUPROFEN 600 MG PO TABS: 600.0000 mg | ORAL_TABLET | Freq: Four times a day (QID) | ORAL | Status: DC

## 2019-01-02 MED ORDER — BENZOCAINE-MENTHOL 20-0.5 % EX AERO: 1.0000 "application " | INHALATION_SPRAY | CUTANEOUS | Status: DC | PRN

## 2019-01-02 MED ORDER — ONDANSETRON HCL 4 MG PO TABS: 4.0000 mg | ORAL_TABLET | ORAL | Status: DC | PRN

## 2019-01-02 MED ORDER — IBUPROFEN 100 MG/5ML PO SUSP
600.0000 mg | Freq: Four times a day (QID) | ORAL | Status: DC
  Administered 2019-01-02: 20 mg via ORAL
  Administered 2019-01-03: 600 mg via ORAL
  Filled 2019-01-02 (×11): qty 30

## 2019-01-02 MED ORDER — SENNOSIDES-DOCUSATE SODIUM 8.6-50 MG PO TABS: 2.0000 | ORAL_TABLET | ORAL | Status: DC

## 2019-01-02 MED ORDER — ACETAMINOPHEN 325 MG PO TABS: 650.0000 mg | ORAL_TABLET | ORAL | Status: DC | PRN

## 2019-01-02 MED ORDER — PRENATAL MULTIVITAMIN CH
1.0000 | ORAL_TABLET | Freq: Every day | ORAL | Status: DC
  Administered 2019-01-02: 1 via ORAL
  Filled 2019-01-02 (×2): qty 1

## 2019-01-02 MED ORDER — COCONUT OIL OIL
1.0000 "application " | TOPICAL_OIL | Status: DC | PRN
  Administered 2019-01-03: 1 via TOPICAL
  Filled 2019-01-02: qty 120

## 2019-01-02 MED ORDER — MEASLES, MUMPS & RUBELLA VAC IJ SOLR
0.5000 mL | Freq: Once | INTRAMUSCULAR | Status: DC
  Filled 2019-01-02: qty 0.5

## 2019-01-02 MED ORDER — ONDANSETRON HCL 4 MG/2ML IJ SOLN: 4.0000 mg | INTRAMUSCULAR | Status: DC | PRN

## 2019-01-02 NOTE — Lactation Note (Signed)
This note was copied from a baby's chart. Lactation Consultation Note: Initial visit with this mom of NICU baby born at 72 w 2 d weight 1 lb 9.8 oz. Mom has pumped one time. Did not obtain any Colostrum. Reports she know how to do hand expression but did not obtain any Colostrum. Encouragement given. Reviewed importance of frequent pumping and hand expression to promote good milk supply for baby. Mom breast fed her first baby for 6 months then reports milk supply dropped off and she was not able to make enough milk for her. Has WIC- I will send referral to them for pump for home. Reviewed setup, use and cleaning of pump pieces. BF brochure and NICU booklet given to mom. No questions at present. Reviewed our phone number to call with questions/concerns and when ready to put baby to the breast.   Patient Name: Boy Faatimah Yoss [id number] Date: 01/02/2019 Reason for consult: Initial assessment;NICU baby;Preterm <34wks;Infant < 6lbs   Maternal Data Formula Feeding for Exclusion: Yes Reason for exclusion: Admission to Intensive Care Unit (ICU) post-partum Has patient been taught Hand Expression?: Yes Does the patient have breastfeeding experience prior to this delivery?: Yes  Feeding    LATCH Score                   Interventions    Lactation Tools Discussed/Used WIC Program: Yes Pump Review: Setup, frequency, and cleaning Initiated by:: RN Date initiated:: 01/02/19   Consult Status Consult Status: Follow-up Date: 01/03/19 Follow-up type: In-patient    Pamelia Hoit 01/02/2019, 8:29 AM

## 2019-01-02 NOTE — Progress Notes (Signed)
CRITICAL VALUE ALERT  Critical Value:  GBS Posititve  Date & Time Notied:  13:58 Message Taken By: Abigail Butts, RN Provider Notified: NICU nurse notified

## 2019-01-02 NOTE — Progress Notes (Signed)
Post Partum Day 1 Subjective: no complaints, up ad lib, voiding and tolerating PO  Objective: Blood pressure (!) 143/82, pulse 84, temperature 97.7 F (36.5 C), temperature source Oral, resp. rate 18, height 5\' 6"  (1.676 m), weight 114.8 kg, last menstrual period 07/08/2018, SpO2 98 %, unknown if currently breastfeeding. Temp:  [97.7 F (36.5 C)-99.4 F (37.4 C)] 97.7 F (36.5 C) (02/15 0512) Pulse Rate:  [84-128] 84 (02/15 0512) Resp:  [18-20] 18 (02/15 0512) BP: (104-156)/(71-127) 143/82 (02/15 0512) SpO2:  [96 %-100 %] 98 % (02/15 0512) Weight:  [114.8 kg] 114.8 kg (02/14 1937)  Physical Exam:  General: alert, cooperative and no distress Lochia: appropriate Uterine Fundus: tiny, difficult to assess Incision:  DVT Evaluation: No evidence of DVT seen on physical exam.  Recent Labs    01/01/19 2001  HGB 12.2  HCT 37.5    Assessment/Plan: Plan for discharge tomorrow   LOS: 1 day   Tilda Burrow 01/02/2019, 6:46 AM

## 2019-01-02 NOTE — Clinical Social Work Maternal (Signed)
CLINICAL SOCIAL WORK MATERNAL/CHILD NOTE  Patient Details  Name: Denise [name] MRN: [id number] Date of Birth: [birth year]  Date:  01/02/2019  Clinical Social Worker Initiating Note:  Laurey Arrow Date/Time: Initiated:  01/02/19/1150     Child's Name:  Denise [name]   Biological Parents:  Mother(FOB is Elliot Dally 09/25/1973)   Need for Interpreter:  None   Reason for Referral:  Parental Support of Premature Babies < 32 weeks/or Critically Ill babies   Address:  [address] [place] [address]    Phone number:  [phone] (home)     Additional phone number: FOB's number is [phone]  Household Members/Support Persons (HM/SP):   Household Member/Support Person 1(MOB reported living with her parents and her older daughter. )   HM/SP Name Relationship DOB or Age  HM/SP -1 Lacheryl Niesen daughter 10/28/2013  HM/SP -2        HM/SP -3        HM/SP -4        HM/SP -5        HM/SP -6        HM/SP -7        HM/SP -8          Natural Supports (not living in the home):  (MOB reported having the support of her parents and FOB. )   Professional Supports: None   Employment: Unemployed   Type of Work:     Education:  Some Therapist, occupational arranged:    Museum/gallery curator Resources:  [employer]   Other Resources:  [employer], Physicist, medical    Cultural/Religious Considerations Which May Impact Care:  None reported  Strengths:  Ability to meet basic needs , Home prepared for child    Psychotropic Medications:         Pediatrician:       Pediatrician List:   [place]      Pediatrician Fax Number:    Risk Factors/Current Problems:  Transportation (CSW provided MOB with information to apply for [employer]. )   Cognitive State:  Able to Concentrate , Insightful , Linear Thinking    Mood/Affect:  Relaxed , Happy , Interested ,  Calm    CSW Assessment: CSW met with MOB in room 303 to complete an assessment for NICU admission.  When CSW arrived, MOB and FOB were watching TV.  CSW explained CSW's role and MOB gave CSW permission to complete assessment while FOB was present.  FOB appeared to be supportive of MOB and was engaged during the assessment.  MOB was polite and was receptive to meeting with CSW.   CSW asked about their thoughts and feelings regarding infant's NICU admission and MOB reported, "It was scary and I really wasn't expecting to have my baby so early." CSW validated and normalized MOB's thoughts and feelings and discussed common emotions often experienced related to a NICU admission as well as during the first couple weeks of the postpartum period. CSW provided education regarding the baby blues period vs. perinatal mood disorders, discussed treatment and gave resources for mental health follow up if concerns arise.  CSW recommends self-evaluation during the postpartum time period using the New Mom Checklist from Postpartum Progress and encouraged MOB to contact a medical professional if symptoms are noted at any time.  MOB denied having any PMAD symptoms with MOB's  older daughter and expressed feeling supported by her support team.     MOB and FOB appeared to have a good understanding of baby's medical situation at this time as they were able to update CSW on his status.  CSW assessed for barriers to MOB and FOB visiting with infant and MOB indicated that transportation maybe a issue.  CSW provided MOB with information to apply for Medicaid Transportation and MOB agreed.  CSW also provided MOB with information regarding infant's eligibility  to apply for SSI benefits.  CSW agreed to provide family with application process information within the next 2 weeks.   CSW will continue to provide resources and supports to family while infant remains in NICU.    CSW Plan/Description:  Perinatal Mood and Anxiety Disorder  (PMADs) Education, Psychosocial Support and Ongoing Assessment of Needs, Other Patient/Family Education, Supplemental Security Income (SSI) Information, Other Information/Referral to [place], MSW, [employer] Clinical Social Work [phone]   Dimple Nanas, Angelica 01/02/2019, 11:56 AM

## 2019-01-03 DIAGNOSIS — Z8619 Personal history of other infectious and parasitic diseases: Secondary | ICD-10-CM

## 2019-01-03 DIAGNOSIS — O99824 Streptococcus B carrier state complicating childbirth: Secondary | ICD-10-CM

## 2019-01-03 MED ORDER — IBUPROFEN 100 MG/5ML PO SUSP: 600.0000 mg | Freq: Four times a day (QID) | ORAL | 1 refills | Status: DC | PRN

## 2019-01-03 NOTE — Lactation Note (Signed)
This note was copied from a baby's chart. Lactation Consultation Note  Patient Name: Denise [name] PYPPJ'K Date: 01/03/2019  Baby Denise now 58 hours old in NICU extreme prematurity. Mom being d/c today.   NICU referral sent but mom has not heard back from [facility].  Left message on designated WIC vm.  Mom reports she has not gotten anything yet with pumping.  Mom reports just started pumping yesterday. Mom has not been doing any hand expression or hands on pumping.  Reviewed Importance of adding it to pumping for baby in NICU.  Watched [employer] expression hands on pumping video with mom. Urged pumping 8-12 times day.  Strongly encouraged pumping 10-12 times day while stimulating and establishing milk supply. .  Mom reports she does not have a way to pump when she goes home.  Got harmony manual pump for her and demo how to use it.  Mom reports that was the pump that she used last time with her daughter so she is familiar with it .  Mom reports she was never able to pump and get more than 2 oz at a pumping session with her daughter. Reemphasized how the pump is not usually able to get out all the milk that is there and she really needed to add her hands to pumping.  Discussed pumping at baby's bedside.  Mom reports she is not comfortable doing that even with the partitions.Discussed [employer]/sts when able and then going to pump room to pump right after.  Praised her commitment to pumping.  Urged her to call lactation as needed.   Maternal Data    Feeding    LATCH Score                   Interventions    Lactation Tools Discussed/Used     Consult Status      Denise [name] 01/03/2019, 1:03 PM

## 2019-01-03 NOTE — Discharge Instructions (Signed)
Vaginal Delivery, Care After °Refer to this sheet in the next few weeks. These instructions provide you with information about caring for yourself after vaginal delivery. Your health care provider may also give you more specific instructions. Your treatment has been planned according to current medical practices, but problems sometimes occur. Call your health care provider if you have any problems or questions. °What can I expect after the procedure? °After vaginal delivery, it is common to have: °· Some bleeding from your vagina. °· Soreness in your abdomen, your vagina, and the area of skin between your vaginal opening and your anus (perineum). °· Pelvic cramps. °· Fatigue. °Follow these instructions at home: °Medicines °· Take over-the-counter and prescription medicines only as told by your health care provider. °· If you were prescribed an antibiotic medicine, take it as told by your health care provider. Do not stop taking the antibiotic until it is finished. °Driving ° °· Do not drive or operate heavy machinery while taking prescription pain medicine. °· Do not drive for 24 hours if you received a sedative. °Lifestyle °· Do not drink alcohol. This is especially important if you are breastfeeding or taking medicine to relieve pain. °· Do not use tobacco products, including cigarettes, chewing tobacco, or e-cigarettes. If you need help quitting, ask your health care provider. °Eating and drinking °· Drink at least 8 eight-ounce glasses of water every day unless you are told not to by your health care provider. If you choose to breastfeed your baby, you may need to drink more water than this. °· Eat high-fiber foods every day. These foods may help prevent or relieve constipation. High-fiber foods include: °? Whole grain cereals and breads. °? Brown rice. °? Beans. °? Fresh fruits and vegetables. °Activity °· Return to your normal activities as told by your health care provider. Ask your health care provider what  activities are safe for you. °· Rest as much as possible. Try to rest or take a nap when your baby is sleeping. °· Do not lift anything that is heavier than your baby or 10 lb (4.5 kg) until your health care provider says that it is safe. °· Talk with your health care provider about when you can engage in sexual activity. This may depend on your: °? Risk of infection. °? Rate of healing. °? Comfort and desire to engage in sexual activity. °Vaginal Care °· If you have an episiotomy or a vaginal tear, check the area every day for signs of infection. Check for: °? More redness, swelling, or pain. °? More fluid or blood. °? Warmth. °? Pus or a bad smell. °· Do not use tampons or douches until your health care provider says this is safe. °· Watch for any blood clots that may pass from your vagina. These may look like clumps of dark red, brown, or black discharge. °General instructions °· Keep your perineum clean and dry as told by your health care provider. °· Wear loose, comfortable clothing. °· Wipe from front to back when you use the toilet. °· Ask your health care provider if you can shower or take a bath. If you had an episiotomy or a perineal tear during labor and delivery, your health care provider may tell you not to take baths for a certain length of time. °· Wear a bra that supports your breasts and fits you well. °· If possible, have someone help you with household activities and help care for your baby for at least a few days after you   leave the hospital. °· Keep all follow-up visits for you and your baby as told by your health care provider. This is important. °Contact a health care provider if: °· You have: °? Vaginal discharge that has a bad smell. °? Difficulty urinating. °? Pain when urinating. °? A sudden increase or decrease in the frequency of your bowel movements. °? More redness, swelling, or pain around your episiotomy or vaginal tear. °? More fluid or blood coming from your episiotomy or vaginal  tear. °? Pus or a bad smell coming from your episiotomy or vaginal tear. °? A fever. °? A rash. °? Little or no interest in activities you used to enjoy. °? Questions about caring for yourself or your baby. °· Your episiotomy or vaginal tear feels warm to the touch. °· Your episiotomy or vaginal tear is separating or does not appear to be healing. °· Your breasts are painful, hard, or turn red. °· You feel unusually sad or worried. °· You feel nauseous or you vomit. °· You pass large blood clots from your vagina. If you pass a blood clot from your vagina, save it to show to your health care provider. Do not flush blood clots down the toilet without having your health care provider look at them. °· You urinate more than usual. °· You are dizzy or light-headed. °· You have not breastfed at all and you have not had a menstrual period for 12 weeks after delivery. °· You have stopped breastfeeding and you have not had a menstrual period for 12 weeks after you stopped breastfeeding. °Get help right away if: °· You have: °? Pain that does not go away or does not get better with medicine. °? Chest pain. °? Difficulty breathing. °? Blurred vision or spots in your vision. °? Thoughts about hurting yourself or your baby. °· You develop pain in your abdomen or in one of your legs. °· You develop a severe headache. °· You faint. °· You bleed from your vagina so much that you fill two sanitary pads in one hour. °This information is not intended to replace advice given to you by your health care provider. Make sure you discuss any questions you have with your health care provider. °Document Released: 11/01/2000 Document Revised: 04/17/2016 Document Reviewed: 11/19/2015 °Elsevier Interactive Patient Education © 2019 Elsevier Inc. ° °

## 2019-01-03 NOTE — Progress Notes (Signed)
OB Note Patient note in room. Will come back later to round  Cornelia Copa MD [facility]ending [facility] [employer] (Faculty Practice) 01/03/2019 Time: [phone]

## 2019-01-07 ENCOUNTER — Ambulatory Visit: Payer: Self-pay

## 2019-01-07 NOTE — Lactation Note (Signed)
This note was copied from a baby's chart. Lactation Consultation Note  Patient Name: Denise [name] Date: 01/07/2019  [name] is 56.29/61 days old.  Mom is concerned about her milk supply.  She initially had a slow start with pumping.  She is currently pumping 6 times/24 hours and obtaining 30 mls each session.  Mom is using a symphony pump.  She states she watches milk and feels anxious about her supply.  Recommended pumping at least 8 times/24 hours, hands on pumping and relaxing during pumping without watching the milk.  Encouraged to give supply a few more days and call us prn.   Maternal Data    Feeding    LATCH Score                   Interventions    Lactation Tools Discussed/Used     Consult Status      Huston Foley 01/07/2019, 11:23 AM

## 2019-01-11 ENCOUNTER — Encounter: Payer: Self-pay | Admitting: Advanced Practice Midwife

## 2019-01-12 ENCOUNTER — Encounter (HOSPITAL_COMMUNITY): Payer: Self-pay

## 2019-01-12 ENCOUNTER — Encounter: Payer: Self-pay | Admitting: Obstetrics and Gynecology

## 2019-01-12 ENCOUNTER — Ambulatory Visit (HOSPITAL_COMMUNITY): Payer: No Typology Code available for payment source

## 2019-01-12 ENCOUNTER — Ambulatory Visit (INDEPENDENT_AMBULATORY_CARE_PROVIDER_SITE_OTHER): Payer: BLUE CROSS/BLUE SHIELD | Admitting: Obstetrics and Gynecology

## 2019-01-12 DIAGNOSIS — Z1389 Encounter for screening for other disorder: Secondary | ICD-10-CM

## 2019-01-12 MED ORDER — ENALAPRIL MALEATE 5 MG PO TABS: 5.0000 mg | ORAL_TABLET | Freq: Every day | ORAL | 0 refills | Status: DC

## 2019-01-12 NOTE — Progress Notes (Signed)
Post Partum Exam  Denise [name] is a 28 y.o. G41P1102 female who presents for a postpartum visit. She is 1.5 weeks postpartum following a spontaneous vaginal delivery. I have fully reviewed the prenatal and intrapartum course. The delivery was at [redacted]w[redacted]d gestational weeks.  Anesthesia: none. Postpartum course has been unremarkable Baby's course has been unremarkable. Baby is feeding by Feeding Tube breast milk. Bleeding spotting. Bowel function is normal. Bladder function is normal. Patient is not sexually active. Contraception method is none. Postpartum depression screening:neg    Review of Systems Pertinent items are noted in HPI.    Objective:  Blood pressure (!) 141/89, pulse 84, weight 244 lb 9.6 oz (110.9 kg), last menstrual period 07/08/2018, unknown if currently breastfeeding.  General:  alert, cooperative and no distress  NEURO: Alert and oriented x 3        Assessment:    Normal postpartum exam.   Plan:   1. Patient doing well without evidence of postpartum depression 2. Patient with elevated BP today without si/sx of preeclampsia. Rx vasotec provided.  3. Follow up in: As scheduled in march for postpartum visit and BP re-evaluation or as needed.

## 2019-02-08 ENCOUNTER — Encounter: Payer: Self-pay | Admitting: Obstetrics and Gynecology

## 2019-02-08 ENCOUNTER — Ambulatory Visit (INDEPENDENT_AMBULATORY_CARE_PROVIDER_SITE_OTHER): Payer: Medicaid Other | Admitting: Obstetrics and Gynecology

## 2019-02-08 NOTE — Progress Notes (Signed)
   TELEHEALTH VIRTUAL OBSTETRICS VISIT ENCOUNTER NOTE  I connected with Denise [name] on 02/08/19 at 10:30 AM EDT by telephone and verified that I am speaking with the correct person using two identifiers.   I discussed the limitations, risks, security and privacy concerns of performing an evaluation and management service by telephone and the availability of in person appointments. I also discussed with the patient that there may be a patient responsible charge related to this service. The patient expressed understanding and agreed to proceed.   atient stated that she is currently on her cycle, pt did not want to answer some of the postpartum questions. Declined BC. PP depression screen 0  Post Partum Exam  Denise [name] is a 28 y.o. G19P1102 female who presents for a postpartum visit. She is 4 weeks postpartum following a spontaneous vaginal delivery. I have fully reviewed the prenatal and intrapartum course. The delivery was at 24 gestational weeks due to PPROM.  Anesthesia: none. Postpartum course has been complicated. Baby's course has been complicated due to being in NICU.[name] is feeding by both breast and bottle - formula when she decides to stop pumping. Bleeding staining only. Bowel function is normal. Bladder function is normal. Patient is sexually active. Contraception method is none. Postpartum depression screening:neg    Last pap smear done 2014 and was Normal  Review of Systems Pertinent items are noted in HPI.    Objective:  Last menstrual period 07/08/2018, unknown if currently breastfeeding.  General:  alert and cooperative        Assessment:    Normal postpartum evaluation. Pap smear not done at today's visit.   Plan:   1. Contraception: none. Patient states that she would welcome a pregnancy at this time. Informed patient that we recommend spacing pregnancy to 18 months 2. Patient did not take the prescribed vasotec. Patient does not plan to come in for 2 hour  glucola stating that "if I am diabetic and have high blood pressure, it is what it is and I will deal with it". Informed patient that a lot of measures can be taken to prevent these disease or their progression. Patient was advised to modify her lifestyle my consuming healthier options and to incorporate weekly 150 minutes of cardio into her regimen. Patient verbalized understanding. Patient is also overdue on her pap smear and plans to follow up with her PCP 3. Follow up in: 4 weeks for annual exam or as needed.

## 2020-04-06 ENCOUNTER — Ambulatory Visit (INDEPENDENT_AMBULATORY_CARE_PROVIDER_SITE_OTHER): Payer: Medicaid Other | Admitting: Family Medicine

## 2020-04-06 ENCOUNTER — Other Ambulatory Visit: Payer: Self-pay

## 2020-04-06 ENCOUNTER — Encounter: Payer: Self-pay | Admitting: Family Medicine

## 2020-04-06 VITALS — BP 136/93 | HR 93 | Temp 98.0°F | Ht 66.0 in | Wt 260.2 lb

## 2020-04-06 DIAGNOSIS — M79641 Pain in right hand: Secondary | ICD-10-CM | POA: Diagnosis not present

## 2020-04-06 DIAGNOSIS — Z8709 Personal history of other diseases of the respiratory system: Secondary | ICD-10-CM | POA: Diagnosis not present

## 2020-04-06 MED ORDER — ALBUTEROL SULFATE HFA 108 (90 BASE) MCG/ACT IN AERS: 2.0000 | INHALATION_SPRAY | RESPIRATORY_TRACT | 3 refills | Status: DC | PRN

## 2020-04-06 MED ORDER — DICLOFENAC SODIUM 75 MG PO TBEC: 75.0000 mg | DELAYED_RELEASE_TABLET | Freq: Two times a day (BID) | ORAL | 0 refills | Status: DC

## 2020-04-06 NOTE — Patient Instructions (Addendum)
   Wear a splint on your right wrist.  I would advise continued use of it for the next for 5 days, then just use it at nighttime if it is improving.  Take diclofenac 75 mg 1 twice daily for pain and inflammation.  You should not take any Aleve (naproxen) or Advil (ibuprofen) while taking the diclofenac because it is a stronger medicine in the same category as they are.  In addition to this you can take Tylenol (acetaminophen) 500 mg 2 pills 3 times daily maximum.  Do not exceed 3000 mg in 24 hours.  If not improving we will probably need to refer you to either a neurologist because of the burning sensation or an orthopedic hand doctor.  Return if needed.  Your symptoms do not suggest any strokelike activity to me.  However if something abruptly changes or gets worse never hesitate to go to the emergency room if needed.  If you have lab work done today you will be contacted with your lab results within the next 2 weeks.  If you have not heard from [place] then please contact us. The fastest way to get your results is to register for My Chart.   IF you received an x-ray today, you will receive an invoice from [facility]. Please contact [facility] at [phone] with questions or concerns regarding your invoice.   IF you received labwork today, you will receive an invoice from [place]. Please contact [facility] at [phone] with questions or concerns regarding your invoice.   Our billing staff will not be able to assist you with questions regarding bills from these companies.  You will be contacted with the lab results as soon as they are available. The fastest way to get your results is to activate your My Chart account. Instructions are located on the last page of this paperwork. If you have not heard from [place] regarding the results in 2 weeks, please contact this office.

## 2020-04-06 NOTE — Addendum Note (Signed)
Addended by: Maille Halliwell H on: 04/06/2020 01:46 PM   Modules accepted: Orders

## 2020-04-06 NOTE — Progress Notes (Signed)
Patient ID: Denise [name], female    DOB: 14-Aug-1991  Age: 29 y.o. MRN: [id number]  Chief Complaint  Patient presents with  . Arm Pain    Pt stated that she has been having some Rt arm pain for the past 2 days. It started on 518/2021 that morning and the pain has gotten worse and her fingers are numb as well.    Subjective:   29 year old lady who has been having pain in her right hand hand and arm for the last couple of days.  She awakened with severe pain in her right hand in the middle 3 fingers and into the thumb.  It hurts to use it.  She checked herself out to be certain she was not having any other stroke symptoms.  She is tied it through for 48 hours but it continued to her her a lot last night and this morning.  It is hard for her to position herself to get comfortable, to reach over and adjust the radio in the car, etc.  She is not employed.  She just does odds and ends around the house.  She does have 2 children, including a 15-year-old with prematurity related special needs.  Otherwise she has been healthy.  Current allergies, medications, problem list, past/family and social histories reviewed.  Objective:  BP (!) 136/93 (BP Location: Left Arm, Patient Position: Sitting, Cuff Size: Large)   Pulse 93   Temp 98 F (36.7 C) (Temporal)   Ht 5\' 6"  (1.676 m)   Wt 260 lb 3.2 oz (118 kg)   LMP 04/03/2020   SpO2 97%   BMI 42.00 kg/m   No major acute distress.  Mild blood pressure elevation is noted today.  Right hand appears grossly normal.  Good capillary refill.  Strength is fair considering the fact that it hurts her.  She is tender in the thumb and middle 3 fingers.  Apposition is fair.  Negative Tinel's sign.  Good radial pulse.  Arm strength is adequate.  Assessment & Plan:   Assessment: 1. Pain of right hand   2. History of asthma       Plan: See instructions.  She needs a new prescription for her asthma medicine while she is here.  No orders of the defined types  were placed in this encounter.   Meds ordered this encounter  Medications  . albuterol (VENTOLIN HFA) 108 (90 Base) MCG/ACT inhaler    Sig: Inhale 2 puffs into the lungs every 4 (four) hours as needed for wheezing.    Dispense:  18 g    Refill:  3  . diclofenac (VOLTAREN) 75 MG EC tablet    Sig: Take 1 tablet (75 mg total) by mouth 2 (two) times daily.    Dispense:  30 tablet    Refill:  0         Patient Instructions     Wear a splint on your right wrist.  I would advise continued use of it for the next for 5 days, then just use it at nighttime if it is improving.  Take diclofenac 75 mg 1 twice daily for pain and inflammation.  You should not take any Aleve (naproxen) or Advil (ibuprofen) while taking the diclofenac because it is a stronger medicine in the same category as they are.  In addition to this you can take Tylenol (acetaminophen) 500 mg 2 pills 3 times daily maximum.  Do not exceed 3000 mg in 24 hours.  If not improving  we will probably need to refer you to either a neurologist because of the burning sensation or an orthopedic hand doctor.  Return if needed.  Your symptoms do not suggest any strokelike activity to me.  However if something abruptly changes or gets worse never hesitate to go to the emergency room if needed.  If you have lab work done today you will be contacted with your lab results within the next 2 weeks.  If you have not heard from [place] then please contact us. The fastest way to get your results is to register for My Chart.   IF you received an x-ray today, you will receive an invoice from [facility]. Please contact [facility] at [phone] with questions or concerns regarding your invoice.   IF you received labwork today, you will receive an invoice from [place]. Please contact [facility] at [phone] with questions or concerns regarding your invoice.   Our billing staff will not be able to assist you with questions  regarding bills from these companies.  You will be contacted with the lab results as soon as they are available. The fastest way to get your results is to activate your My Chart account. Instructions are located on the last page of this paperwork. If you have not heard from [place] regarding the results in 2 weeks, please contact this office.        Return if symptoms worsen or fail to improve.   Janace Hoard, MD 04/06/2020

## 2020-04-28 ENCOUNTER — Ambulatory Visit: Payer: Medicaid Other | Admitting: Family Medicine

## 2020-05-18 DIAGNOSIS — F4323 Adjustment disorder with mixed anxiety and depressed mood: Secondary | ICD-10-CM | POA: Diagnosis not present

## 2020-05-26 ENCOUNTER — Other Ambulatory Visit: Payer: Self-pay

## 2020-05-26 ENCOUNTER — Emergency Department
Admission: EM | Admit: 2020-05-26 | Discharge: 2020-05-27 | Disposition: A | Discharge status: Home / Self Care | Payer: Medicaid Other | Attending: Emergency Medicine | Admitting: Emergency Medicine

## 2020-05-26 ENCOUNTER — Encounter: Payer: Self-pay | Admitting: *Deleted

## 2020-05-26 DIAGNOSIS — J45909 Unspecified asthma, uncomplicated: Secondary | ICD-10-CM | POA: Insufficient documentation

## 2020-05-26 DIAGNOSIS — Z7984 Long term (current) use of oral hypoglycemic drugs: Secondary | ICD-10-CM | POA: Insufficient documentation

## 2020-05-26 DIAGNOSIS — R0689 Other abnormalities of breathing: Secondary | ICD-10-CM | POA: Diagnosis not present

## 2020-05-26 DIAGNOSIS — I10 Essential (primary) hypertension: Secondary | ICD-10-CM | POA: Diagnosis not present

## 2020-05-26 DIAGNOSIS — R000 Tachycardia, unspecified: Secondary | ICD-10-CM | POA: Diagnosis not present

## 2020-05-26 DIAGNOSIS — F419 Anxiety disorder, unspecified: Secondary | ICD-10-CM | POA: Diagnosis not present

## 2020-05-26 DIAGNOSIS — R064 Hyperventilation: Secondary | ICD-10-CM | POA: Diagnosis not present

## 2020-05-26 DIAGNOSIS — E1165 Type 2 diabetes mellitus with hyperglycemia: Secondary | ICD-10-CM | POA: Diagnosis not present

## 2020-05-26 DIAGNOSIS — R069 Unspecified abnormalities of breathing: Secondary | ICD-10-CM | POA: Diagnosis not present

## 2020-05-26 DIAGNOSIS — E119 Type 2 diabetes mellitus without complications: Secondary | ICD-10-CM

## 2020-05-26 LAB — URINALYSIS, COMPLETE (UACMP) WITH MICROSCOPIC
Bacteria, UA: NONE SEEN
Bilirubin Urine: NEGATIVE
Glucose, UA: >=500 mg/dL — AB
Hgb urine dipstick: NEGATIVE
Ketones, ur: 5 mg/dL — AB
Nitrite: NEGATIVE
Protein, ur: NEGATIVE mg/dL
Specific Gravity, Urine: 1.036 — ABNORMAL HIGH (ref 1.005–1.030)
pH: 5 (ref 5.0–8.0)

## 2020-05-26 LAB — BASIC METABOLIC PANEL
Anion gap: 14 (ref 5–15)
BUN: 13 mg/dL (ref 6–20)
CO2: 24 mmol/L (ref 22–32)
Calcium: 9.9 mg/dL (ref 8.9–10.3)
Chloride: 96 mmol/L — ABNORMAL LOW (ref 98–111)
Creatinine, Ser: 1.1 mg/dL — ABNORMAL HIGH (ref 0.44–1.00)
GFR calc Af Amer: >60 mL/min (ref >60)
GFR calc non Af Amer: >60 mL/min (ref >60)
Glucose, Bld: 629 mg/dL (ref 70–99)
Potassium: 4.9 mmol/L (ref 3.5–5.1)
Sodium: 134 mmol/L — ABNORMAL LOW (ref 135–145)

## 2020-05-26 LAB — CBC
HCT: 44.9 % (ref 36.0–46.0)
Hemoglobin: 14.4 g/dL (ref 12.0–15.0)
MCH: 22.7 pg — ABNORMAL LOW (ref 26.0–34.0)
MCHC: 32.1 g/dL (ref 30.0–36.0)
MCV: 70.9 fL — ABNORMAL LOW (ref 80.0–100.0)
Platelets: 398 10*3/uL (ref 150–400)
RBC: 6.33 MIL/uL — ABNORMAL HIGH (ref 3.87–5.11)
RDW: 15.9 % — ABNORMAL HIGH (ref 11.5–15.5)
WBC: 5.2 10*3/uL (ref 4.0–10.5)
nRBC: 0 % (ref 0.0–0.2)

## 2020-05-26 LAB — POCT PREGNANCY, URINE: Preg Test, Ur: NEGATIVE

## 2020-05-26 LAB — GLUCOSE, CAPILLARY: Glucose-Capillary: 552 mg/dL (ref 70–99)

## 2020-05-26 MED ORDER — LACTATED RINGERS IV BOLUS
1000.0000 mL | Freq: Once | INTRAVENOUS | Status: AC
  Administered 2020-05-26: 1000 mL via INTRAVENOUS

## 2020-05-26 MED ORDER — LACTATED RINGERS IV BOLUS
1000.0000 mL | Freq: Once | INTRAVENOUS | Status: AC
  Administered 2020-05-27: 1000 mL via INTRAVENOUS
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
## 2020-05-27 LAB — GLUCOSE, CAPILLARY
Glucose-Capillary: 312 mg/dL — ABNORMAL HIGH (ref 70–99)
Glucose-Capillary: 367 mg/dL — ABNORMAL HIGH (ref 70–99)
Glucose-Capillary: 428 mg/dL — ABNORMAL HIGH (ref 70–99)

## 2020-05-27 MED ORDER — INSULIN ASPART 100 UNIT/ML ~~LOC~~ SOLN
5.0000 [IU] | Freq: Once | SUBCUTANEOUS | Status: AC
  Administered 2020-05-27: 5 [IU] via INTRAVENOUS
  Filled 2020-05-27: qty 1

## 2020-05-27 MED ORDER — METFORMIN HCL 500 MG PO TABS: 500.0000 mg | ORAL_TABLET | Freq: Two times a day (BID) | ORAL | 11 refills | Status: DC

## 2020-05-29 LAB — GLUCOSE, CAPILLARY: Glucose-Capillary: >600 mg/dL (ref 70–99)

## 2020-06-01 ENCOUNTER — Ambulatory Visit: Payer: Medicaid Other | Admitting: Family Medicine

## 2020-06-01 ENCOUNTER — Other Ambulatory Visit: Payer: Self-pay

## 2020-06-01 ENCOUNTER — Encounter: Payer: Self-pay | Admitting: Family Medicine

## 2020-06-01 VITALS — BP 140/96 | HR 78 | Temp 98.0°F | Ht 66.0 in | Wt 244.6 lb

## 2020-06-01 DIAGNOSIS — Z6839 Body mass index (BMI) 39.0-39.9, adult: Secondary | ICD-10-CM

## 2020-06-01 DIAGNOSIS — I10 Essential (primary) hypertension: Secondary | ICD-10-CM

## 2020-06-01 DIAGNOSIS — E119 Type 2 diabetes mellitus without complications: Secondary | ICD-10-CM

## 2020-06-01 MED ORDER — LOSARTAN POTASSIUM 50 MG PO TABS: 50.0000 mg | ORAL_TABLET | Freq: Every day | ORAL | 3 refills | Status: DC

## 2020-06-01 NOTE — Progress Notes (Signed)
Patient ID: Denise [name], female    DOB: Jun 25, 1991  Age: 29 y.o. MRN: [id number]  Chief Complaint  Patient presents with  . Follow-up    Pt stated that she had a panic atack on 05/26/2020 and ended up in th ER. When they checked her they told her that her BS was 548, then went up again to 620 and then up again to 720 and told her to F/U with her primary on her Diabetes    Subjective:   Patient is here having been to the emergency room and having had a blood sugar over 600.  She had had a panic attack which was the start of all of this.  There is a lot of diabetes in her family and she knows some about it.  She has started dieting.  Actually she had begun on low-fat diet but she has changed over to more a diabetic diet in the last few days.  She feels okay otherwise.  Anxiety.  Needle phobia.  Current allergies, medications, problem list, past/family and social histories reviewed.  Objective:  BP (!) 140/96 (BP Location: Right Arm, Patient Position: Sitting, Cuff Size: Large)   Pulse 78   Temp 98 F (36.7 C) (Temporal)   Ht _0  (1.676 m)   Wt 244 lb 9.6 oz (110.9 kg)   LMP 05/02/2020   SpO2 98%   BMI 39.48 kg/m  Obese young lady in no acute distress.  We have a 25-minute discussion but did not do an exam on her today.  Assessment & Plan:   Assessment: 1. New onset type 2 diabetes mellitus ([place])   2. Essential hypertension   3. Class 2 severe obesity with serious comorbidity and body mass index (BMI) of 39.0 to 39.9 in adult, unspecified obesity type ([place])       Plan: See instructions.  Went through a lot of things in detail with her.  Orders Placed This Encounter  Procedures  . Microalbumin, urine  . CBC  . CMP14+EGFR  . Lipid panel  . TSH  . Hemoglobin A1c    Meds ordered this encounter  Medications  . losartan (COZAAR) 50 MG tablet    Sig: Take 1 tablet (50 mg total) by mouth daily.    Dispense:  90 tablet    Refill:  3         Patient Instructions     Study the American diabetes Association website diabetes.org.  Continue to work hard on watching your diet and getting regular exercise  Take Metformin 500 mg 2 pills 2 times daily at breakfast and supper  Drink plenty of fluids  We will let you know the results of your additional labs in a few days  Take losartan 50 mg 1 daily for blood pressure  Return in 1 month   If you have lab work done today you will be contacted with your lab results within the next 2 weeks.  If you have not heard from [place] then please contact us. The fastest way to get your results is to register for My Chart.   IF you received an x-ray today, you will receive an invoice from [facility]. Please contact [facility] at [phone] with questions or concerns regarding your invoice.   IF you received labwork today, you will receive an invoice from [place]. Please contact [facility] at [phone] with questions or concerns regarding your invoice.   Our billing staff will not be able to assist you with questions regarding  bills from these companies.  You will be contacted with the lab results as soon as they are available. The fastest way to get your results is to activate your My Chart account. Instructions are located on the last page of this paperwork. If you have not heard from [place] regarding the results in 2 weeks, please contact this office.        Return in about 4 weeks (around 06/29/2020) for Diabetes mellitus and hypertension.   Ruben Reason, MD 06/01/2020

## 2020-06-01 NOTE — Patient Instructions (Addendum)
  Study the American diabetes Association website diabetes.org.  Continue to work hard on watching your diet and getting regular exercise  Take Metformin 500 mg 2 pills 2 times daily at breakfast and supper  Drink plenty of fluids  We will let you know the results of your additional labs in a few days  Take losartan 50 mg 1 daily for blood pressure  Return in 1 month   If you have lab work done today you will be contacted with your lab results within the next 2 weeks.  If you have not heard from [place] then please contact us. The fastest way to get your results is to register for My Chart.   IF you received an x-ray today, you will receive an invoice from [facility]. Please contact [facility] at [phone] with questions or concerns regarding your invoice.   IF you received labwork today, you will receive an invoice from [place]. Please contact [facility] at [phone] with questions or concerns regarding your invoice.   Our billing staff will not be able to assist you with questions regarding bills from these companies.  You will be contacted with the lab results as soon as they are available. The fastest way to get your results is to activate your My Chart account. Instructions are located on the last page of this paperwork. If you have not heard from [place] regarding the results in 2 weeks, please contact this office.

## 2020-06-02 ENCOUNTER — Other Ambulatory Visit: Payer: Self-pay | Admitting: Family Medicine

## 2020-06-02 ENCOUNTER — Encounter: Payer: Self-pay | Admitting: Family Medicine

## 2020-06-02 DIAGNOSIS — E119 Type 2 diabetes mellitus without complications: Secondary | ICD-10-CM

## 2020-06-02 LAB — CMP14+EGFR
ALT: 20 IU/L (ref 0–32)
AST: 18 IU/L (ref 0–40)
Albumin/Globulin Ratio: 1.6 (ref 1.2–2.2)
Albumin: 4.2 g/dL (ref 3.9–5.0)
Alkaline Phosphatase: 114 IU/L (ref 48–121)
BUN/Creatinine Ratio: 11 (ref 9–23)
BUN: 8 mg/dL (ref 6–20)
Bilirubin Total: 0.3 mg/dL (ref 0.0–1.2)
CO2: 20 mmol/L (ref 20–29)
Calcium: 8.9 mg/dL (ref 8.7–10.2)
Chloride: 102 mmol/L (ref 96–106)
Creatinine, Ser: 0.76 mg/dL (ref 0.57–1.00)
GFR calc Af Amer: 123 mL/min/1.73
GFR calc non Af Amer: 107 mL/min/1.73
Globulin, Total: 2.7 g/dL (ref 1.5–4.5)
Glucose: 251 mg/dL — ABNORMAL HIGH (ref 65–99)
Potassium: 4.9 mmol/L (ref 3.5–5.2)
Sodium: 134 mmol/L (ref 134–144)
Total Protein: 6.9 g/dL (ref 6.0–8.5)

## 2020-06-02 LAB — CBC
Hematocrit: 41.1 % (ref 34.0–46.6)
Hemoglobin: 12.8 g/dL (ref 11.1–15.9)
MCH: 22.5 pg — ABNORMAL LOW (ref 26.6–33.0)
MCHC: 31.1 g/dL — ABNORMAL LOW (ref 31.5–35.7)
MCV: 72 fL — ABNORMAL LOW (ref 79–97)
Platelets: 333 10*3/uL (ref 150–450)
RBC: 5.69 x10E6/uL — ABNORMAL HIGH (ref 3.77–5.28)
RDW: 15.7 % — ABNORMAL HIGH (ref 11.7–15.4)
WBC: 3.8 10*3/uL (ref 3.4–10.8)

## 2020-06-02 LAB — TSH: TSH: 1.12 u[IU]/mL (ref 0.450–4.500)

## 2020-06-02 LAB — LIPID PANEL
Chol/HDL Ratio: 2.2 ratio (ref 0.0–4.4)
Cholesterol, Total: 158 mg/dL (ref 100–199)
HDL: 71 mg/dL (ref >39)
LDL Chol Calc (NIH): 70 mg/dL (ref 0–99)
Triglycerides: 92 mg/dL (ref 0–149)
VLDL Cholesterol Cal: 17 mg/dL (ref 5–40)

## 2020-06-02 LAB — MICROALBUMIN, URINE: Microalbumin, Urine: 108.9 ug/mL

## 2020-06-02 LAB — HEMOGLOBIN A1C
Est. average glucose Bld gHb Est-mCnc: 369 mg/dL
Hgb A1c MFr Bld: 14.5 % — ABNORMAL HIGH (ref 4.8–5.6)

## 2020-06-02 MED ORDER — DAPAGLIFLOZIN PROPANEDIOL 5 MG PO TABS: 5.0000 mg | ORAL_TABLET | Freq: Every day | ORAL | 3 refills | Status: DC

## 2020-06-02 NOTE — Progress Notes (Signed)
Call patient:Diabetic tests confirm a very high hemoglobin A1c of 14.5.  Desired goal in a diabetic is down around 7.  This will come down over the next few months as we treat your diabetes.  You also have a little protein in your urine which can indicate risk of diabetes damaging the kidneys, so it is important we follow-up on this over the next 3 to 6 months.  Watch your diet and take your medicines as directed and things will get better.  The other labs, including your other kidney function tests, are good.Denise [name]. Alwyn Ren, MD

## 2020-06-23 ENCOUNTER — Ambulatory Visit: Payer: Medicaid Other | Admitting: Registered Nurse

## 2020-06-23 ENCOUNTER — Other Ambulatory Visit: Payer: Self-pay

## 2020-06-23 ENCOUNTER — Encounter: Payer: Self-pay | Admitting: Registered Nurse

## 2020-06-23 VITALS — BP 120/80 | HR 95 | Temp 97.5°F | Resp 18 | Ht 66.0 in | Wt 244.8 lb

## 2020-06-23 DIAGNOSIS — E119 Type 2 diabetes mellitus without complications: Secondary | ICD-10-CM

## 2020-06-23 LAB — POCT GLYCOSYLATED HEMOGLOBIN (HGB A1C): Hemoglobin A1C: 14 % — AB (ref 4.0–5.6)

## 2020-06-23 LAB — GLUCOSE, POCT (MANUAL RESULT ENTRY): POC Glucose: 304 mg/dl — AB (ref 70–99)

## 2020-06-23 MED ORDER — SIMVASTATIN 20 MG PO TABS: 20.0000 mg | ORAL_TABLET | Freq: Every day | ORAL | 3 refills | Status: DC

## 2020-06-23 MED ORDER — METFORMIN HCL 1000 MG PO TABS: 1000.0000 mg | ORAL_TABLET | Freq: Two times a day (BID) | ORAL | 0 refills | Status: DC

## 2020-06-23 NOTE — Patient Instructions (Signed)
° ° ° °  If you have lab work done today you will be contacted with your lab results within the next 2 weeks.  If you have not heard from us then please contact us. The fastest way to get your results is to register for My Chart. ° ° °IF you received an x-ray today, you will receive an invoice from [facility]. Please contact [facility] at [phone] with questions or concerns regarding your invoice.  ° °IF you received labwork today, you will receive an invoice from [facility]. Please contact [facility] at [phone] with questions or concerns regarding your invoice.  ° °Our billing staff will not be able to assist you with questions regarding bills from these companies. ° °You will be contacted with the lab results as soon as they are available. The fastest way to get your results is to activate your My Chart account. Instructions are located on the last page of this paperwork. If you have not heard from us regarding the results in 2 weeks, please contact this office. °  ° ° ° °

## 2020-06-23 NOTE — Progress Notes (Signed)
Established Patient Office Visit  Subjective:  Patient ID: Denise [name], female    DOB: May 21, 1991  Age: 29 y.o. MRN: [id number]  CC:  Chief Complaint  Patient presents with  . Follow-up    4 week follow up for diabetes . patient states she would like to discuss getting a medication for kidney    HPI Denise [name] presents for 1 mo follow up for newly dx t2dm.  Taking medications as prescribed: metformin 500mg  PO bid and farxiga 5mg  PO qd. No AEs noted. Feeling better since starting She is monitoring her sugars - only once has had reading over 300, otherwise has been high 100s and into mid 200s.  Vision is improving but still a concern - occasionally blurry. Does note that she has been better able to determine what foods to eat and not to eat - this has helped her a lot. Drinking close to 1 gallon of water daily - occasionally diet sodas.   Past Medical History:  Diagnosis Date  . Allergy   . Arthritis   . Asthma   . Gestational diabetes   . Hx of chlamydia infection 2017  . Pregnancy induced hypertension    Pre-E  . Seasonal allergies   . Trichomonas 2017    Past Surgical History:  Procedure Laterality Date  . CERVICAL CERCLAGE N/A 11/23/2018   Procedure: CERCLAGE CERVICAL;  Surgeon: 2018, MD;  Location: [facility] BIRTHING SUITES;  Service: Gynecology;  Laterality: N/A;    Family History  Problem Relation Age of Onset  . Arthritis Mother   . Diabetes Mother   . Diabetes Maternal Grandmother   . Arthritis Maternal Grandmother     Social History   Socioeconomic History  . Marital status: Single    Spouse name: Not on file  . Number of children: 1  . Years of education: Not on file  . Highest education level: Not on file  Occupational History  . Occupation: Childcare  Tobacco Use  . Smoking status: Never Smoker  . Smokeless tobacco: Never Used  Vaping Use  . Vaping Use: Never used  Substance and Sexual Activity  . Alcohol use: No  . Drug use: No    . Sexual activity: Yes    Partners: Male    Birth control/protection: None    Comment: with monagamous partner   Other Topics Concern  . Not on file  Social History Narrative   Pt is from [place]. She lives in [place] with her mother and child.    Social Determinants of Health   Financial Resource Strain:   . Difficulty of Paying Living Expenses:   Food Insecurity:   . Worried About [place] in the Last Year:   . [place] in the Last Year:   Transportation Needs:   . Programme researcher, broadcasting/film/video (Medical):   Barista Lack of Transportation (Non-Medical):   Physical Activity:   . Days of Exercise per Week:   . Minutes of Exercise per Session:   Stress:   . Feeling of Stress :   Social Connections:   . Frequency of Communication with Friends and Family:   . Frequency of Social Gatherings with Friends and Family:   . Attends Religious Services:   . Active Member of Clubs or Organizations:   . Attends Freight forwarder Meetings:   Marland Kitchen Marital Status:   Intimate Partner Violence:   . Fear of Current or Ex-Partner:   . Emotionally Abused:   .  Physically Abused:   . Sexually Abused:     Outpatient Medications Prior to Visit  Medication Sig Dispense Refill  . albuterol (VENTOLIN HFA) 108 (90 Base) MCG/ACT inhaler Inhale 2 puffs into the lungs every 4 (four) hours as needed for wheezing. 18 g 3  . dapagliflozin propanediol (FARXIGA) 5 MG TABS tablet Take 1 tablet (5 mg total) by mouth daily before breakfast. 30 tablet 3  . diclofenac (VOLTAREN) 75 MG EC tablet Take 1 tablet (75 mg total) by mouth 2 (two) times daily. 30 tablet 0  . losartan (COZAAR) 50 MG tablet Take 1 tablet (50 mg total) by mouth daily. 90 tablet 3  . metFORMIN (GLUCOPHAGE) 500 MG tablet Take 1 tablet (500 mg total) by mouth 2 (two) times daily with a meal. 60 tablet 11  . ibuprofen (ADVIL,MOTRIN) 100 MG/5ML suspension Take 30 mLs (600 mg total) by mouth every 6 (six) hours as needed for fever,  mild pain or moderate pain. 900 mL 1   No facility-administered medications prior to visit.    No Known Allergies  ROS Review of Systems  Constitutional: Negative.   HENT: Negative.   Eyes: Positive for visual disturbance. Negative for photophobia, pain, discharge, redness and itching.  Respiratory: Negative.   Cardiovascular: Negative.   Gastrointestinal: Negative.   Endocrine: Negative.   Genitourinary: Negative.   Musculoskeletal: Negative.   Skin: Negative.   Allergic/Immunologic: Negative.   Neurological: Negative.   Hematological: Negative.   Psychiatric/Behavioral: Negative.   All other systems reviewed and are negative.     Objective:    Physical Exam Vitals and nursing note reviewed.  Constitutional:      General: She is not in acute distress.    Appearance: Normal appearance. She is obese. She is not ill-appearing, toxic-appearing or diaphoretic.  Cardiovascular:     Rate and Rhythm: Normal rate and regular rhythm.  Pulmonary:     Effort: Pulmonary effort is normal. No respiratory distress.  Neurological:     General: No focal deficit present.     Mental Status: She is alert and oriented to person, place, and time. Mental status is at baseline.  Psychiatric:        Mood and Affect: Mood normal.        Behavior: Behavior normal.        Thought Content: Thought content normal.        Judgment: Judgment normal.     BP 120/80   Pulse 95   Temp (!) 97.5 F (36.4 C) (Temporal)   Resp 18   Ht 5\' 6"  (1.676 m)   Wt 244 lb 12.8 oz (111 kg)   SpO2 97%   BMI 39.51 kg/m  Wt Readings from Last 3 Encounters:  06/23/20 244 lb 12.8 oz (111 kg)  06/01/20 244 lb 9.6 oz (110.9 kg)  05/26/20 239 lb (108.4 kg)     Health Maintenance Due  Topic Date Due  . Hepatitis C Screening  Never done  . INFLUENZA VACCINE  06/18/2020    There are no preventive care reminders to display for this patient.  Lab Results  Component Value Date   TSH 1.120 06/01/2020    Lab Results  Component Value Date   WBC 3.8 06/01/2020   HGB 12.8 06/01/2020   HCT 41.1 06/01/2020   MCV 72 (L) 06/01/2020   PLT 333 06/01/2020   Lab Results  Component Value Date   NA 134 06/01/2020   K 4.9 06/01/2020   CO2 20 06/01/2020  GLUCOSE 251 (H) 06/01/2020   BUN 8 06/01/2020   CREATININE 0.76 06/01/2020   BILITOT 0.3 06/01/2020   ALKPHOS 114 06/01/2020   AST 18 06/01/2020   ALT 20 06/01/2020   PROT 6.9 06/01/2020   ALBUMIN 4.2 06/01/2020   CALCIUM 8.9 06/01/2020   ANIONGAP 14 05/26/2020   Lab Results  Component Value Date   CHOL 158 06/01/2020   Lab Results  Component Value Date   HDL 71 06/01/2020   Lab Results  Component Value Date   LDLCALC 70 06/01/2020   Lab Results  Component Value Date   TRIG 92 06/01/2020   Lab Results  Component Value Date   CHOLHDL 2.2 06/01/2020   Lab Results  Component Value Date   HGBA1C 14.0 (A) 06/23/2020      Assessment & Plan:   Problem List Items Addressed This Visit    None    Visit Diagnoses    New onset type 2 diabetes mellitus (HCC)    -  Primary   Relevant Medications   metFORMIN (GLUCOPHAGE) 1000 MG tablet   simvastatin (ZOCOR) 20 MG tablet   Other Relevant Orders   POCT glycosylated hemoglobin (Hb A1C) (Completed)   POCT glucose (manual entry) (Completed)      Meds ordered this encounter  Medications  . metFORMIN (GLUCOPHAGE) 1000 MG tablet    Sig: Take 1 tablet (1,000 mg total) by mouth 2 (two) times daily with a meal.    Dispense:  180 tablet    Refill:  0    Order Specific Question:   Supervising Provider    Answer:   Neva Seat, JEFFREY R [[id number]]  . simvastatin (ZOCOR) 20 MG tablet    Sig: Take 1 tablet (20 mg total) by mouth at bedtime.    Dispense:  90 tablet    Refill:  3    Order Specific Question:   Supervising Provider    Answer:   Neva Seat, JEFFREY R [[id number]]    Follow-up: No follow-ups on file.   PLAN  A1c still very high, as expected. Sugar is 304  Increase metformin  to 1000mg  PO bid  Start simvastatin 20mg  PO qd with dinner  Continue strict control of diet and regular exercise  Refer to ophthalmology for t2dm eye exam  Patient encouraged to call clinic with any questions, comments, or concerns.  , NP

## 2020-06-27 ENCOUNTER — Other Ambulatory Visit: Payer: Self-pay | Admitting: Registered Nurse

## 2020-06-27 ENCOUNTER — Other Ambulatory Visit: Payer: Self-pay

## 2020-06-27 DIAGNOSIS — E119 Type 2 diabetes mellitus without complications: Secondary | ICD-10-CM

## 2020-06-27 NOTE — Telephone Encounter (Signed)
Pt needs refill for Accu check guide test strips/ A510 /please advise  Pt has 3 left    [id number] [place], [place] - [address] Phone:  [phone]  Fax:  [phone]

## 2020-06-28 MED ORDER — ACCU-CHEK GUIDE VI STRP
ORAL_STRIP | 4 refills | Status: DC
Start: 2020-06-28 — End: 2021-02-20

## 2020-07-11 ENCOUNTER — Ambulatory Visit: Payer: Medicaid Other | Admitting: Family Medicine

## 2020-07-11 ENCOUNTER — Other Ambulatory Visit: Payer: Self-pay

## 2020-07-11 ENCOUNTER — Encounter: Payer: Self-pay | Admitting: Family Medicine

## 2020-07-11 VITALS — BP 123/84 | HR 77 | Temp 97.6°F | Ht 66.0 in | Wt 247.0 lb

## 2020-07-11 DIAGNOSIS — J0110 Acute frontal sinusitis, unspecified: Secondary | ICD-10-CM | POA: Diagnosis not present

## 2020-07-11 DIAGNOSIS — E1165 Type 2 diabetes mellitus with hyperglycemia: Secondary | ICD-10-CM | POA: Diagnosis not present

## 2020-07-11 MED ORDER — AMOXICILLIN-POT CLAVULANATE 875-125 MG PO TABS: 1.0000 | ORAL_TABLET | Freq: Two times a day (BID) | ORAL | 0 refills | Status: DC

## 2020-07-11 MED ORDER — FLUCONAZOLE 150 MG PO TABS
150.0000 mg | ORAL_TABLET | Freq: Once | ORAL | 0 refills | Status: AC
Start: 2020-07-11 — End: 2020-07-11

## 2020-07-11 NOTE — Patient Instructions (Addendum)
° °  Stop farxiga Continue working on diet and exercise Continue checking blood glucose, premeal goal ~ 130 Reach out sooner if blood glucose is rising again  If you have lab work done today you will be contacted with your lab results within the next 2 weeks.  If you have not heard from [place] then please contact us. The fastest way to get your results is to register for My Chart.   IF you received an x-ray today, you will receive an invoice from [facility]. Please contact [facility] at [phone] with questions or concerns regarding your invoice.   IF you received labwork today, you will receive an invoice from [place]. Please contact [facility] at [phone] with questions or concerns regarding your invoice.   Our billing staff will not be able to assist you with questions regarding bills from these companies.  You will be contacted with the lab results as soon as they are available. The fastest way to get your results is to activate your My Chart account. Instructions are located on the last page of this paperwork. If you have not heard from [place] regarding the results in 2 weeks, please contact this office.

## 2020-07-11 NOTE — Progress Notes (Signed)
8/24/20218:32 AM  Denise [name] [birth year], 29 y.o., female [id number]  Chief Complaint  Patient presents with  . Eye Problem    told due to the farxiga, she is having pressure in the face and eyes with drainage. Also having nausea and headache. Has not taken farxiga in 3 days    HPI:   Patient is a 29 y.o. female with past medical history significant for uncontrolled DM2 who presents today for followup  Seen on aug 6th by Kateri Plummer NP She started metformin and farxiga in august - she was doing well, her cbgs have come down into the 100s However then she started feeling jittery, palpitations, hands tingling - has checked cbgs and it was fine She stopped farxiga for couple of days in case cbgs were too low and decided to restart as she cbgs was doing well On day when she restarted farxiga - she got very nauseous and started vomiting She has then stopped taking farxiga She last took it 5 days ago - all these sx resolved, here cbgs have remained 170-180s (premeals) She also has a sinus infection (reports increased pressure/headache right side, thick nasal drainage)  She has been working on her diet - cutting back on portions, increased veggie intake, limiting sweets, has started exercising - walking/jogging          Lab Results  Component Value Date   HGBA1C 14.0 (A) 06/23/2020   HGBA1C 14.5 (H) 06/01/2020   Lab Results  Component Value Date   LDLCALC 70 06/01/2020   CREATININE 0.76 06/01/2020    Depression screen PHQ 2/9 06/23/2020 06/01/2020 04/06/2020  Decreased Interest 0 0 0  Down, Depressed, Hopeless 0 0 0  PHQ - 2 Score 0 0 0    Fall Risk  06/23/2020 06/01/2020 04/06/2020 10/19/2018 01/26/2018  Falls in the past year? 0 0 0 0 No  Number falls in past yr: 0 0 0 - -  Injury with Fall? 0 0 0 - -  Follow up Falls evaluation completed Falls evaluation completed Falls evaluation completed - -     No Known Allergies  Prior to Admission medications   Medication Sig Start Date  End Date Taking? Authorizing Provider  albuterol (VENTOLIN HFA) 108 (90 Base) MCG/ACT inhaler Inhale 2 puffs into the lungs every 4 (four) hours as needed for wheezing. 04/06/20  Yes Peyton Najjar, MD  diclofenac (VOLTAREN) 75 MG EC tablet Take 1 tablet (75 mg total) by mouth 2 (two) times daily. 04/06/20  Yes Peyton Najjar, MD  glucose blood (ACCU-CHEK GUIDE) test strip Check blood sugars once daily before meals 06/28/20  Yes Janeece Agee, NP  losartan (COZAAR) 50 MG tablet Take 1 tablet (50 mg total) by mouth daily. 06/01/20  Yes Peyton Najjar, MD  metFORMIN (GLUCOPHAGE) 1000 MG tablet Take 1 tablet (1,000 mg total) by mouth 2 (two) times daily with a meal. 06/23/20 09/21/20 Yes Janeece Agee, NP  simvastatin (ZOCOR) 20 MG tablet Take 1 tablet (20 mg total) by mouth at bedtime. 06/23/20  Yes Janeece Agee, NP  dapagliflozin propanediol (FARXIGA) 5 MG TABS tablet Take 1 tablet (5 mg total) by mouth daily before breakfast. Patient not taking: Reported on 07/11/2020 06/02/20   Peyton Najjar, MD    Past Medical History:  Diagnosis Date  . Allergy   . Arthritis   . Asthma   . Gestational diabetes   . Hx of chlamydia infection 2017  . Pregnancy induced hypertension    Pre-E  .  Seasonal allergies   . Trichomonas 2017    Past Surgical History:  Procedure Laterality Date  . CERVICAL CERCLAGE N/A 11/23/2018   Procedure: CERCLAGE CERVICAL;  Surgeon: Reva [place]ores, MD;  Location: [facility] BIRTHING SUITES;  Service: Gynecology;  Laterality: N/A;    Social History   Tobacco Use  . Smoking status: Never Smoker  . Smokeless tobacco: Never Used  Substance Use Topics  . Alcohol use: No    Family History  Problem Relation Age of Onset  . Arthritis Mother   . Diabetes Mother   . Diabetes Maternal Grandmother   . Arthritis Maternal Grandmother     Review of Systems  Constitutional: Negative for chills and fever.  Respiratory: Negative for cough and shortness of breath.   Cardiovascular:  Positive for palpitations. Negative for chest pain and leg swelling.  Gastrointestinal: Positive for nausea and vomiting. Negative for abdominal pain.   Per hpi  OBJECTIVE:  Today's Vitals   07/11/20 0824  BP: 123/84  Pulse: 77  Temp: 97.6 F (36.4 C)  SpO2: 98%  Weight: 247 lb (112 kg)  Height: 5\' 6"  (1.676 m)   Body mass index is 39.87 kg/m.   Physical Exam Vitals and nursing note reviewed.  Constitutional:      Appearance: She is well-developed.  HENT:     Head: Normocephalic and atraumatic.     Right Ear: Hearing, tympanic membrane, ear canal and external ear normal.     Left Ear: Hearing, tympanic membrane, ear canal and external ear normal.     Nose:     Right Sinus: Frontal sinus tenderness present. No maxillary sinus tenderness.     Left Sinus: No maxillary sinus tenderness or frontal sinus tenderness.  Eyes:     Extraocular Movements: Extraocular movements intact.     Conjunctiva/sclera: Conjunctivae normal.     Pupils: Pupils are equal, round, and reactive to light.  Cardiovascular:     Rate and Rhythm: Normal rate and regular rhythm.     Heart sounds: Normal heart sounds. No murmur heard.  No friction rub. No gallop.   Pulmonary:     Effort: Pulmonary effort is normal.     Breath sounds: Normal breath sounds. No wheezing, rhonchi or rales.  Musculoskeletal:     Cervical back: Neck supple.  Lymphadenopathy:     Cervical: No cervical adenopathy.  Skin:    General: Skin is warm and dry.  Neurological:     Mental Status: She is alert and oriented to person, place, and time.     No results found for this or any previous visit (from the past 24 hour(s)).  No results found.   ASSESSMENT and PLAN  1. Acute non-recurrent frontal sinusitis Discussed supportive measures, new meds r/se/b and RTC precautions.   2. Uncontrolled type 2 diabetes mellitus with hyperglycemia (HCC) D/c farxiga due to side effects. cbgs are improved. Cont metformin. Cont with  diet and exercise. Cont home glucose monitoring. RTC precautions given  Other orders - amoxicillin-clavulanate (AUGMENTIN) 875-125 MG tablet; Take 1 tablet by mouth 2 (two) times daily. - fluconazole (DIFLUCAN) 150 MG tablet; Take 1 tablet (150 mg total) by mouth once for 1 dose.  Return in about 2 months (around 09/10/2020) for DM.    09/12/2020, MD [facility] at [facility] [address] [place], [place] Ph.  [phone] Fax [phone]

## 2020-07-17 ENCOUNTER — Ambulatory Visit (INDEPENDENT_AMBULATORY_CARE_PROVIDER_SITE_OTHER): Payer: Medicaid Other | Admitting: Registered Nurse

## 2020-07-17 ENCOUNTER — Other Ambulatory Visit: Payer: Self-pay

## 2020-07-17 DIAGNOSIS — Z111 Encounter for screening for respiratory tuberculosis: Secondary | ICD-10-CM | POA: Diagnosis not present

## 2020-07-19 ENCOUNTER — Other Ambulatory Visit: Payer: Self-pay

## 2020-07-19 ENCOUNTER — Ambulatory Visit: Payer: Medicaid Other

## 2020-07-19 ENCOUNTER — Ambulatory Visit: Payer: Medicaid Other | Admitting: Registered Nurse

## 2020-07-19 DIAGNOSIS — Z111 Encounter for screening for respiratory tuberculosis: Secondary | ICD-10-CM

## 2020-07-19 LAB — TB SKIN TEST
Induration: 0 mm
TB Skin Test: NEGATIVE

## 2020-07-19 NOTE — Progress Notes (Signed)
TB READ.

## 2020-08-02 DIAGNOSIS — Z20822 Contact with and (suspected) exposure to covid-19: Secondary | ICD-10-CM | POA: Diagnosis not present

## 2020-08-02 DIAGNOSIS — Z03818 Encounter for observation for suspected exposure to other biological agents ruled out: Secondary | ICD-10-CM | POA: Diagnosis not present

## 2020-08-18 DIAGNOSIS — F4323 Adjustment disorder with mixed anxiety and depressed mood: Secondary | ICD-10-CM | POA: Diagnosis not present

## 2020-08-23 ENCOUNTER — Ambulatory Visit: Payer: Medicaid Other | Admitting: Registered Nurse

## 2020-08-23 ENCOUNTER — Other Ambulatory Visit: Payer: Self-pay

## 2020-08-23 ENCOUNTER — Encounter: Payer: Self-pay | Admitting: Registered Nurse

## 2020-08-23 VITALS — BP 125/82 | HR 77 | Temp 97.6°F | Resp 18 | Ht 66.0 in | Wt 250.2 lb

## 2020-08-23 DIAGNOSIS — Z23 Encounter for immunization: Secondary | ICD-10-CM | POA: Diagnosis not present

## 2020-08-23 DIAGNOSIS — E1165 Type 2 diabetes mellitus with hyperglycemia: Secondary | ICD-10-CM

## 2020-08-23 LAB — POCT GLYCOSYLATED HEMOGLOBIN (HGB A1C): Hemoglobin A1C: 9.2 % — AB (ref 4.0–5.6)

## 2020-08-23 NOTE — Progress Notes (Signed)
Established Patient Office Visit  Subjective:  Patient ID: Denise [name], female    DOB: 03-05-1991  Age: 29 y.o. MRN: [id number]  CC:  Chief Complaint  Patient presents with   Follow-up    Patient is here for 2 month follow up. Patient has no other concerns just needs a flu vaccine    HPI Denise [name] presents for t2dm follow up   Was seen on 06/23/20 with an a1c of 14.0 Has been taking metformin 1000mg  PO bid and watching diet We had originally started her on farxiga as well - unforunately had nv with this and stopped Symptoms resolved  Home sugars between 100-150 usually. Lowest was 79. Highest was 180s.  No symptoms of hyper or hypoglycemia No symptoms of EOD or complications Feeling well overall.   Past Medical History:  Diagnosis Date   Allergy    Arthritis    Asthma    Gestational diabetes    Hx of chlamydia infection 2017   Pregnancy induced hypertension    Pre-E   Seasonal allergies    Trichomonas 2017    Past Surgical History:  Procedure Laterality Date   CERVICAL CERCLAGE N/A 11/23/2018   Procedure: CERCLAGE CERVICAL;  Surgeon: 01/22/2019, MD;  Location: [facility] BIRTHING SUITES;  Service: Gynecology;  Laterality: N/A;    Family History  Problem Relation Age of Onset   Arthritis Mother    Diabetes Mother    Diabetes Maternal Grandmother    Arthritis Maternal Grandmother     Social History   Socioeconomic History   Marital status: Single    Spouse name: Not on file   Number of children: 1   Years of education: Not on file   Highest education level: Not on file  Occupational History   Occupation: Childcare  Tobacco Use   Smoking status: Never Smoker   Smokeless tobacco: Never Used  [facility] Use: Never used  Substance and Sexual Activity   Alcohol use: No   Drug use: No   Sexual activity: Yes    Partners: Male    Birth control/protection: None    Comment: with monagamous partner   Other Topics  Concern   Not on file  Social History Narrative   Pt is from [employer] [place]. She lives in [place] with her mother and child.    Social Determinants of Health   Financial Resource Strain:    Difficulty of Paying Living Expenses: Not on file  Food Insecurity:    Worried About [place] in the Last Year: Not on file   Programme researcher, broadcasting/film/video of Food in the Last Year: Not on file  Transportation Needs:    Lack of Transportation (Medical): Not on file   Lack of Transportation (Non-Medical): Not on file  Physical Activity:    Days of Exercise per Week: Not on file   Minutes of Exercise per Session: Not on file  Stress:    Feeling of Stress : Not on file  Social Connections:    Frequency of Communication with Friends and Family: Not on file   Frequency of Social Gatherings with Friends and Family: Not on file   Attends Religious Services: Not on file   Active Member of Clubs or Organizations: Not on file   Attends [employer] Meetings: Not on file   Marital Status: Not on file  Intimate Partner Violence:    Fear of Current or Ex-Partner: Not on file   Emotionally Abused:  Not on file   Physically Abused: Not on file   Sexually Abused: Not on file    Outpatient Medications Prior to Visit  Medication Sig Dispense Refill   albuterol (VENTOLIN HFA) 108 (90 Base) MCG/ACT inhaler Inhale 2 puffs into the lungs every 4 (four) hours as needed for wheezing. 18 g 3   amoxicillin-clavulanate (AUGMENTIN) 875-125 MG tablet Take 1 tablet by mouth 2 (two) times daily. 14 tablet 0   diclofenac (VOLTAREN) 75 MG EC tablet Take 1 tablet (75 mg total) by mouth 2 (two) times daily. 30 tablet 0   glucose blood (ACCU-CHEK GUIDE) test strip Check blood sugars once daily before meals 100 each 4   losartan (COZAAR) 50 MG tablet Take 1 tablet (50 mg total) by mouth daily. 90 tablet 3   metFORMIN (GLUCOPHAGE) 1000 MG tablet Take 1 tablet (1,000 mg total) by mouth 2 (two) times  daily with a meal. 180 tablet 0   simvastatin (ZOCOR) 20 MG tablet Take 1 tablet (20 mg total) by mouth at bedtime. 90 tablet 3   No facility-administered medications prior to visit.    Allergies  Allergen Reactions   Farxiga [Dapagliflozin] Nausea And Vomiting    ROS Review of Systems  Constitutional: Negative.   HENT: Negative.   Eyes: Negative.   Respiratory: Negative.   Cardiovascular: Negative.   Gastrointestinal: Negative.   Genitourinary: Negative.   Musculoskeletal: Negative.   Skin: Negative.   Neurological: Negative.   Psychiatric/Behavioral: Negative.       Objective:    Physical Exam Vitals and nursing note reviewed.  Constitutional:      General: She is not in acute distress.    Appearance: Normal appearance. She is normal weight. She is not ill-appearing, toxic-appearing or diaphoretic.  Cardiovascular:     Rate and Rhythm: Normal rate and regular rhythm.     Heart sounds: Normal heart sounds. No murmur heard.  No friction rub. No gallop.   Pulmonary:     Effort: Pulmonary effort is normal. No respiratory distress.     Breath sounds: Normal breath sounds. No stridor. No wheezing, rhonchi or rales.  Chest:     Chest wall: No tenderness.  Skin:    General: Skin is warm and dry.  Neurological:     General: No focal deficit present.     Mental Status: She is alert and oriented to person, place, and time. Mental status is at baseline.  Psychiatric:        Mood and Affect: Mood normal.        Behavior: Behavior normal.        Thought Content: Thought content normal.        Judgment: Judgment normal.     BP 125/82    Pulse 77    Temp 97.6 F (36.4 C) (Temporal)    Resp 18    Ht 5\' 6"  (1.676 m)    Wt 250 lb 3.2 oz (113.5 kg)    LMP 07/30/2020    SpO2 98%    BMI 40.38 kg/m  Wt Readings from Last 3 Encounters:  08/23/20 250 lb 3.2 oz (113.5 kg)  07/11/20 247 lb (112 kg)  06/23/20 244 lb 12.8 oz (111 kg)     There are no preventive care reminders  to display for this patient.  There are no preventive care reminders to display for this patient.  Lab Results  Component Value Date   TSH 1.120 06/01/2020   Lab Results  Component Value Date  WBC 3.8 06/01/2020   HGB 12.8 06/01/2020   HCT 41.1 06/01/2020   MCV 72 (L) 06/01/2020   PLT 333 06/01/2020   Lab Results  Component Value Date   NA 134 06/01/2020   K 4.9 06/01/2020   CO2 20 06/01/2020   GLUCOSE 251 (H) 06/01/2020   BUN 8 06/01/2020   CREATININE 0.76 06/01/2020   BILITOT 0.3 06/01/2020   ALKPHOS 114 06/01/2020   AST 18 06/01/2020   ALT 20 06/01/2020   PROT 6.9 06/01/2020   ALBUMIN 4.2 06/01/2020   CALCIUM 8.9 06/01/2020   ANIONGAP 14 05/26/2020   Lab Results  Component Value Date   CHOL 158 06/01/2020   Lab Results  Component Value Date   HDL 71 06/01/2020   Lab Results  Component Value Date   LDLCALC 70 06/01/2020   Lab Results  Component Value Date   TRIG 92 06/01/2020   Lab Results  Component Value Date   CHOLHDL 2.2 06/01/2020   Lab Results  Component Value Date   HGBA1C 9.2 (A) 08/23/2020      Assessment & Plan:   Problem List Items Addressed This Visit    None    Visit Diagnoses    Uncontrolled type 2 diabetes mellitus with hyperglycemia (HCC)    -  Primary   Relevant Orders   POCT glycosylated hemoglobin (Hb A1C) (Completed)   Flu vaccine need       Relevant Orders   Flu Vaccine QUAD 6+ mos PF IM (Fluarix Quad PF) (Completed)      No orders of the defined types were placed in this encounter.   Follow-up: No follow-ups on file.   PLAN  a1c down to 9.2. great improvement  Continue metformin 1000mg  PO bid   Continue losartan and simvastatin  Return in 3 mo for t2dm check  Patient encouraged to call clinic with any questions, comments, or concerns.  , NP

## 2020-08-23 NOTE — Patient Instructions (Signed)
° ° ° °  If you have lab work done today you will be contacted with your lab results within the next 2 weeks.  If you have not heard from us then please contact us. The fastest way to get your results is to register for My Chart. ° ° °IF you received an x-ray today, you will receive an invoice from [facility]. Please contact [facility] at [phone] with questions or concerns regarding your invoice.  ° °IF you received labwork today, you will receive an invoice from [facility]. Please contact [facility] at [phone] with questions or concerns regarding your invoice.  ° °Our billing staff will not be able to assist you with questions regarding bills from these companies. ° °You will be contacted with the lab results as soon as they are available. The fastest way to get your results is to activate your My Chart account. Instructions are located on the last page of this paperwork. If you have not heard from us regarding the results in 2 weeks, please contact this office. °  ° ° ° °

## 2020-08-26 DIAGNOSIS — H5213 Myopia, bilateral: Secondary | ICD-10-CM | POA: Diagnosis not present

## 2020-08-28 IMAGING — US US MFM OB LIMITED
1 series · 14 of 28 positions shown · non-contrast
Comparison: none

[Series 1: us mfm ob limited · 28 acquisitions, 14 frames shown]
[im 2/28]
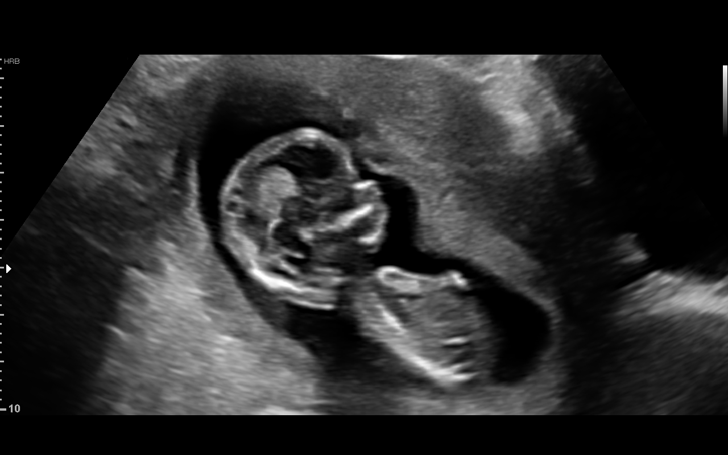
[im 4/28]
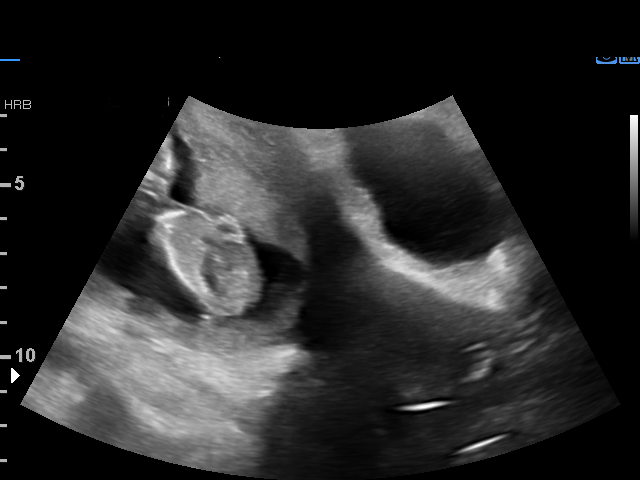
[im 6/28]
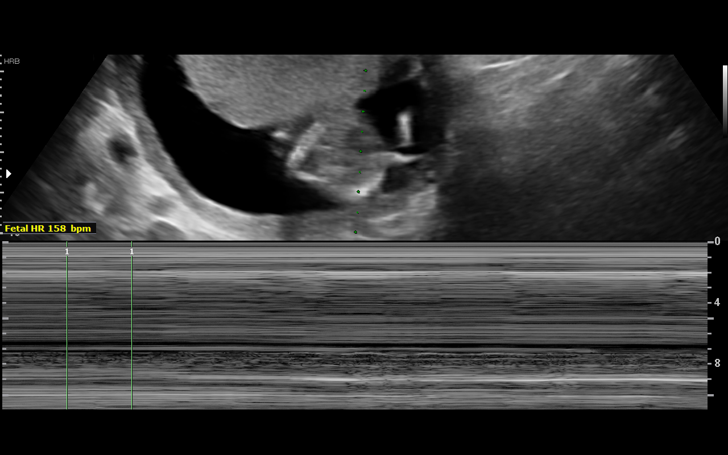
[im 8/28]
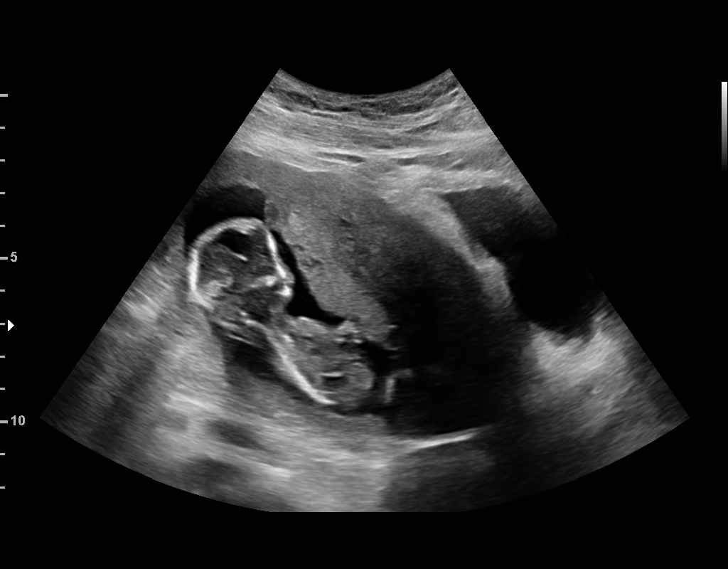
[im 10/28]
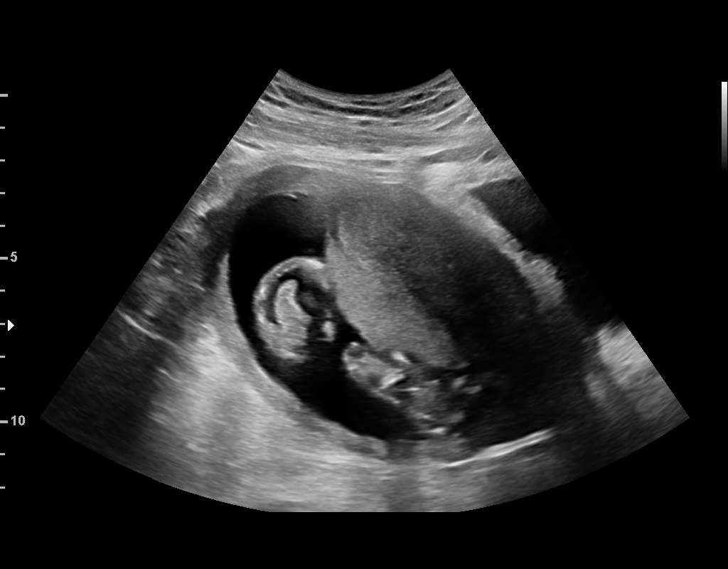
[im 12/28]
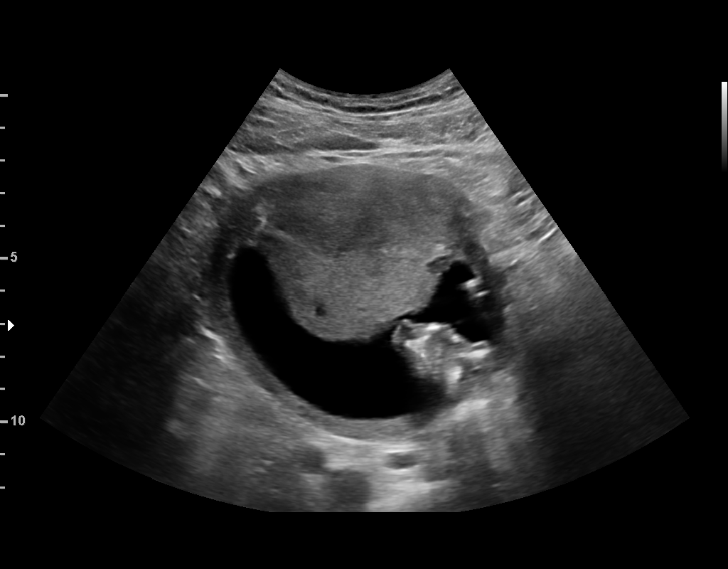
[im 14/28]
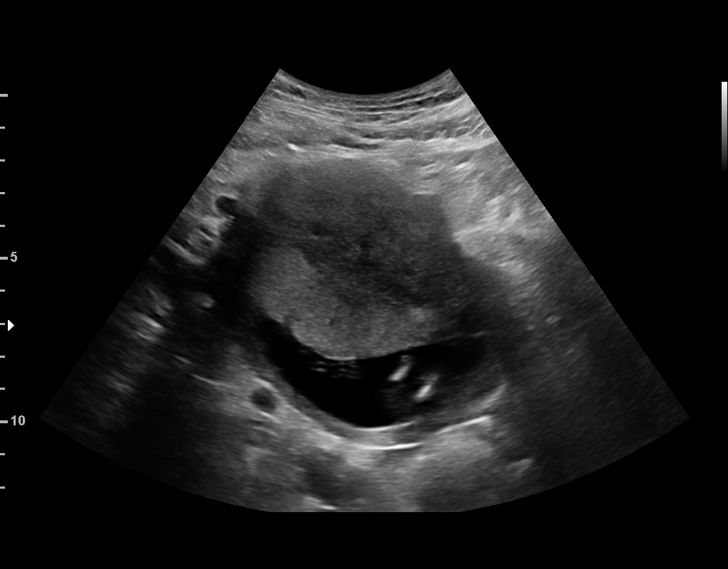
[im 16/28]
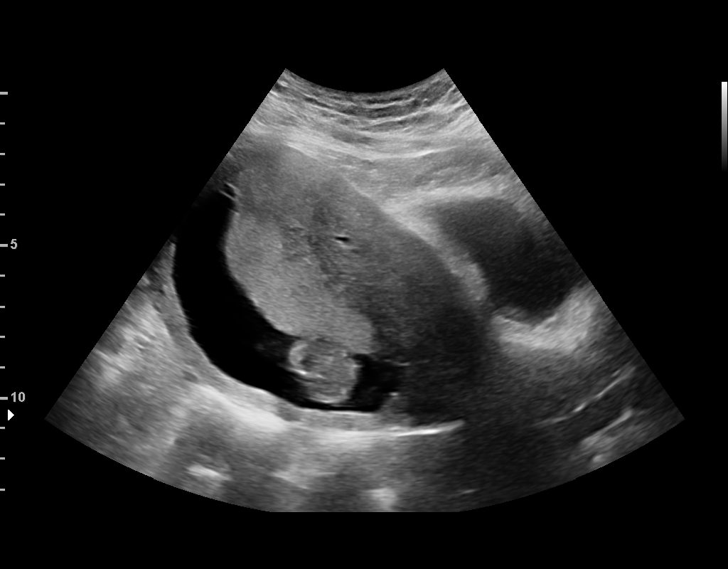
[im 18/28]
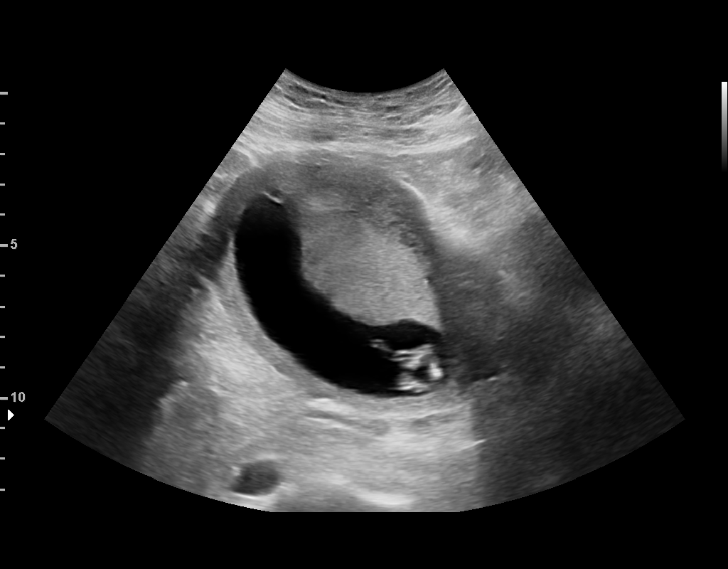
[im 20/28]
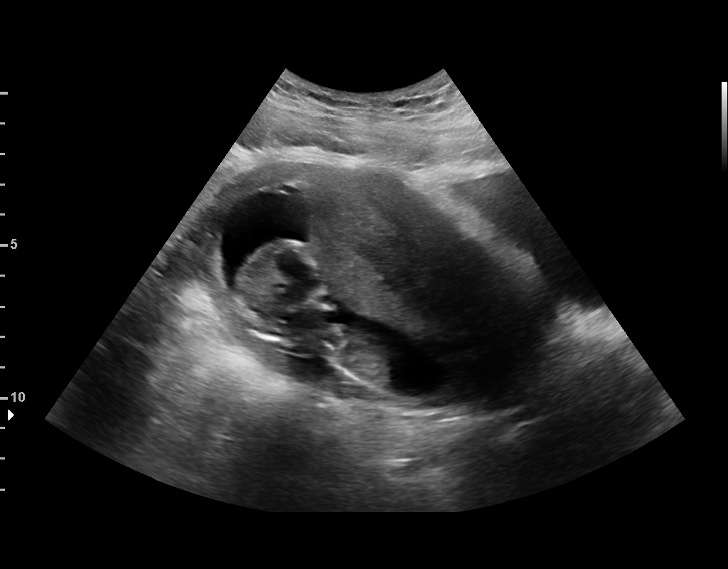
[im 22/28]
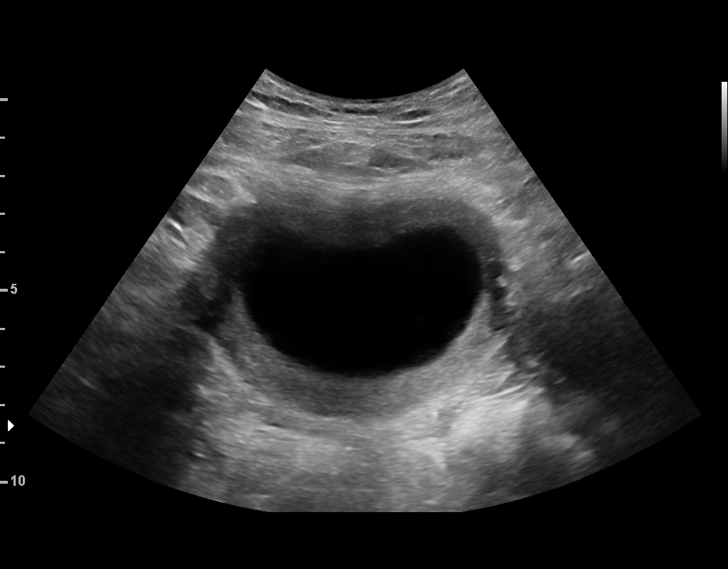
[im 24/28]
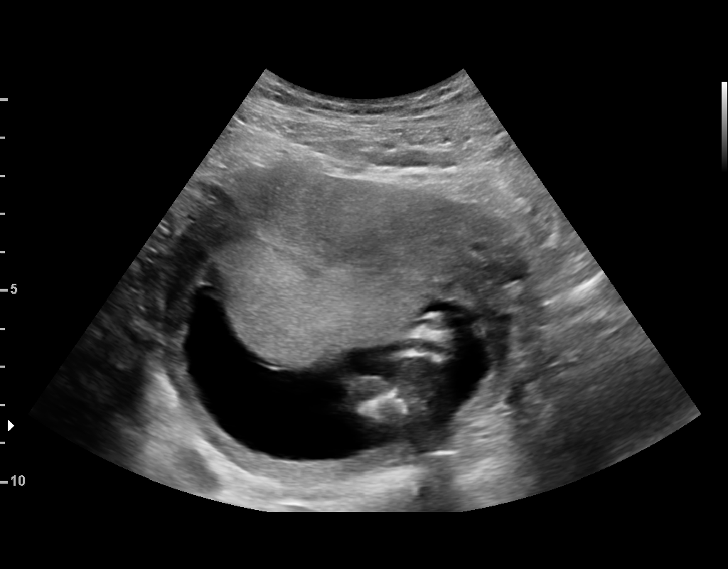
[im 26/28]
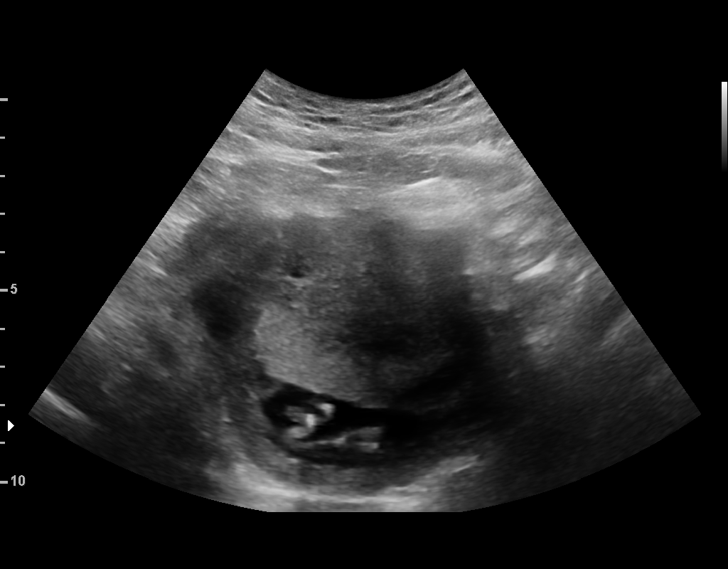
[im 28/28]
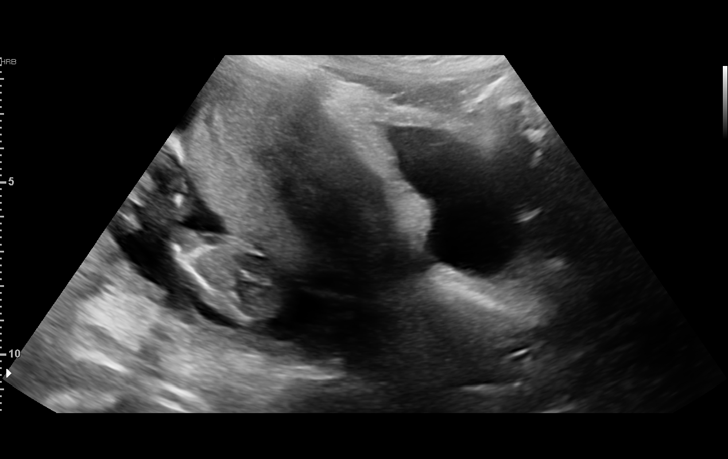

[14 of 28 positions shown; findings below may reference images not displayed]

1  US MFM OB LIMITED                    76815.01     KUSUM CINTRON
 ----------------------------------------------------------------------

 ----------------------------------------------------------------------
Indications

  Encounter for cervical length (Hx short cervix
  w/ last pregnancy)
  14 weeks gestation of pregnancy
 ----------------------------------------------------------------------
Vital Signs

 BMI:
Fetal Evaluation

 Num Of Fetuses:         1
 Cardiac Activity:       Observed
 Presentation:           Breech

 Amniotic Fluid
 AFI FV:      Within normal limits
OB History

 Gravidity:    2         Term:   1
 Living:       1
Gestational Age

 LMP:           14w 5d        Date:  07/08/18                 EDD:   04/14/19
 Best:          14w 5d     Det. By:  LMP  (07/08/18)          EDD:   04/14/19
Cervix Uterus Adnexa

 Cervix
 Appears closed
Impression

 History of short cervix on mid-trimester scan in her previous
 pregnancy. She had, however, a term delivery.

 A limited ultrasound study was performed. Amniotic fluid is
 normal and good fetal activity is seen. On transabdominal
 scan, the cervix looks long and closed.

 I reassured the patient that given her history of term delivery,
 the likelihood of preterm delivery is small.

Recommendations

 -An appointment was made for her to return in 3 weeks for
 transvaginal assessment of cervix.
                 Tekle, Artis

## 2020-08-29 DIAGNOSIS — F4323 Adjustment disorder with mixed anxiety and depressed mood: Secondary | ICD-10-CM | POA: Diagnosis not present

## 2020-09-05 DIAGNOSIS — F4323 Adjustment disorder with mixed anxiety and depressed mood: Secondary | ICD-10-CM | POA: Diagnosis not present

## 2020-09-12 DIAGNOSIS — F4321 Adjustment disorder with depressed mood: Secondary | ICD-10-CM | POA: Diagnosis not present

## 2020-09-16 ENCOUNTER — Other Ambulatory Visit: Payer: Self-pay | Admitting: Registered Nurse

## 2020-09-16 DIAGNOSIS — E119 Type 2 diabetes mellitus without complications: Secondary | ICD-10-CM

## 2020-09-16 NOTE — Telephone Encounter (Signed)
Requested Prescriptions  Pending Prescriptions Disp Refills   metFORMIN (GLUCOPHAGE) 1000 MG tablet [Pharmacy Med Name: METFORMIN HCL 1,000 MG TABLET] 180 tablet 0    Sig: TAKE 1 TABLET BY MOUTH TWICE A DAY WITH MEALS     Endocrinology:  Diabetes - Biguanides Failed - 09/16/2020  8:30 AM      Failed - HBA1C is between 0 and 7.9 and within 180 days    Hemoglobin A1C  Date Value Ref Range Status  08/23/2020 9.2 (A) 4.0 - 5.6 % Final   Hgb A1c MFr Bld  Date Value Ref Range Status  06/01/2020 14.5 (H) 4.8 - 5.6 % Final    Comment:             Prediabetes: 5.7 - 6.4          Diabetes: >6.4          Glycemic control for adults with diabetes: <7.0          Passed - Cr in normal range and within 360 days    Creatinine, Ser  Date Value Ref Range Status  06/01/2020 0.76 0.57 - 1.00 mg/dL Final         Passed - AA eGFR in normal range and within 360 days    GFR calc Af Amer  Date Value Ref Range Status  06/01/2020 123 >59 mL/min/1.73 Final    Comment:    **[facility] currently reports eGFR in compliance with the current**   recommendations of the [employer]. [facility] will   update reporting as new guidelines are published from the NKF-ASN   Task force.    GFR calc non Af Amer  Date Value Ref Range Status  06/01/2020 107 >59 mL/min/1.73 Final         Passed - Valid encounter within last 6 months    Recent Outpatient Visits          3 weeks ago Uncontrolled type 2 diabetes mellitus with hyperglycemia ([place])   [facility], Richard, NP   2 months ago Acute non-recurrent frontal sinusitis   [facility] at [place], MD   2 months ago New onset type 2 diabetes mellitus [facility])   [facility], Delfino Lovett, NP   3 months ago New onset type 2 diabetes mellitus [facility])   Primary Care at [facility], Fenton Malling, MD   5 months ago Pain of right hand   Primary Care at [facility], Fenton Malling, MD      Future Appointments             In 2 months Maximiano Coss, NP Primary Care at [place], [facility]

## 2020-09-20 DIAGNOSIS — F4321 Adjustment disorder with depressed mood: Secondary | ICD-10-CM | POA: Diagnosis not present

## 2020-10-10 DIAGNOSIS — F4321 Adjustment disorder with depressed mood: Secondary | ICD-10-CM | POA: Diagnosis not present

## 2020-10-16 ENCOUNTER — Encounter: Payer: Medicaid Other | Admitting: Family Medicine

## 2020-10-25 DIAGNOSIS — F4321 Adjustment disorder with depressed mood: Secondary | ICD-10-CM | POA: Diagnosis not present

## 2020-11-02 DIAGNOSIS — F4321 Adjustment disorder with depressed mood: Secondary | ICD-10-CM | POA: Diagnosis not present

## 2020-11-13 DIAGNOSIS — F4321 Adjustment disorder with depressed mood: Secondary | ICD-10-CM | POA: Diagnosis not present

## 2020-11-22 DIAGNOSIS — F4321 Adjustment disorder with depressed mood: Secondary | ICD-10-CM | POA: Diagnosis not present

## 2020-11-24 ENCOUNTER — Encounter: Payer: Self-pay | Admitting: Registered Nurse

## 2020-11-24 ENCOUNTER — Ambulatory Visit: Payer: Medicaid Other | Admitting: Registered Nurse

## 2020-11-24 ENCOUNTER — Other Ambulatory Visit: Payer: Self-pay

## 2020-11-24 VITALS — BP 140/82 | HR 88 | Temp 93.3°F | Ht 66.0 in | Wt 256.8 lb

## 2020-11-24 DIAGNOSIS — G479 Sleep disorder, unspecified: Secondary | ICD-10-CM | POA: Diagnosis not present

## 2020-11-24 DIAGNOSIS — E1165 Type 2 diabetes mellitus with hyperglycemia: Secondary | ICD-10-CM

## 2020-11-24 DIAGNOSIS — F419 Anxiety disorder, unspecified: Secondary | ICD-10-CM

## 2020-11-24 LAB — POCT GLYCOSYLATED HEMOGLOBIN (HGB A1C): Hemoglobin A1C: 8.4 % — AB (ref 4.0–5.6)

## 2020-11-24 LAB — CMP14+EGFR
ALT: 15 IU/L (ref 0–32)
AST: 15 IU/L (ref 0–40)
Albumin/Globulin Ratio: 1.2 (ref 1.2–2.2)
Albumin: 4.1 g/dL (ref 3.9–5.0)
Alkaline Phosphatase: 90 IU/L (ref 44–121)
BUN/Creatinine Ratio: 8 — ABNORMAL LOW (ref 9–23)
BUN: 7 mg/dL (ref 6–20)
Bilirubin Total: <0.2 mg/dL (ref 0.0–1.2)
CO2: 21 mmol/L (ref 20–29)
Calcium: 9.5 mg/dL (ref 8.7–10.2)
Chloride: 100 mmol/L (ref 96–106)
Creatinine, Ser: 0.87 mg/dL (ref 0.57–1.00)
GFR calc Af Amer: 105 mL/min/{1.73_m2} (ref >59)
GFR calc non Af Amer: 91 mL/min/{1.73_m2} (ref >59)
Globulin, Total: 3.3 g/dL (ref 1.5–4.5)
Glucose: 205 mg/dL — ABNORMAL HIGH (ref 65–99)
Potassium: 4.9 mmol/L (ref 3.5–5.2)
Sodium: 136 mmol/L (ref 134–144)
Total Protein: 7.4 g/dL (ref 6.0–8.5)

## 2020-11-24 LAB — CBC WITH DIFFERENTIAL
Basophils Absolute: 0 10*3/uL (ref 0.0–0.2)
Basos: 1 %
EOS (ABSOLUTE): 0.2 10*3/uL (ref 0.0–0.4)
Eos: 4 %
Hematocrit: 37.9 % (ref 34.0–46.6)
Hemoglobin: 12.1 g/dL (ref 11.1–15.9)
Immature Grans (Abs): 0 10*3/uL (ref 0.0–0.1)
Immature Granulocytes: 0 %
Lymphocytes Absolute: 1.4 10*3/uL (ref 0.7–3.1)
Lymphs: 33 %
MCH: 23 pg — ABNORMAL LOW (ref 26.6–33.0)
MCHC: 31.9 g/dL (ref 31.5–35.7)
MCV: 72 fL — ABNORMAL LOW (ref 79–97)
Monocytes Absolute: 0.4 10*3/uL (ref 0.1–0.9)
Monocytes: 9 %
Neutrophils Absolute: 2.3 10*3/uL (ref 1.4–7.0)
Neutrophils: 53 %
RBC: 5.26 x10E6/uL (ref 3.77–5.28)
RDW: 14.3 % (ref 11.7–15.4)
WBC: 4.3 10*3/uL (ref 3.4–10.8)

## 2020-11-24 NOTE — Patient Instructions (Signed)
° ° ° °  If you have lab work done today you will be contacted with your lab results within the next 2 weeks.  If you have not heard from us then please contact us. The fastest way to get your results is to register for My Chart. ° ° °IF you received an x-ray today, you will receive an invoice from [facility]. Please contact  Radiology at [phone] with questions or concerns regarding your invoice.  ° °IF you received labwork today, you will receive an invoice from [facility]. Please contact [facility] at [phone] with questions or concerns regarding your invoice.  ° °Our billing staff will not be able to assist you with questions regarding bills from these companies. ° °You will be contacted with the lab results as soon as they are available. The fastest way to get your results is to activate your My Chart account. Instructions are located on the last page of this paperwork. If you have not heard from us regarding the results in 2 weeks, please contact this office. °  ° ° ° °
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
## 2020-11-25 ENCOUNTER — Encounter: Payer: Self-pay | Admitting: Registered Nurse

## 2020-11-25 MED ORDER — HYDROXYZINE HCL 10 MG PO TABS: 10.0000 mg | ORAL_TABLET | Freq: Three times a day (TID) | ORAL | 0 refills | Status: DC | PRN

## 2020-11-25 MED ORDER — TRAZODONE HCL 50 MG PO TABS: 25.0000 mg | ORAL_TABLET | Freq: Every evening | ORAL | 3 refills | Status: DC | PRN

## 2020-11-29 DIAGNOSIS — F4321 Adjustment disorder with depressed mood: Secondary | ICD-10-CM | POA: Diagnosis not present

## 2020-12-26 DIAGNOSIS — F4321 Adjustment disorder with depressed mood: Secondary | ICD-10-CM | POA: Diagnosis not present

## 2020-12-30 ENCOUNTER — Other Ambulatory Visit: Payer: Self-pay | Admitting: Registered Nurse

## 2020-12-30 DIAGNOSIS — G479 Sleep disorder, unspecified: Secondary | ICD-10-CM

## 2020-12-30 NOTE — Telephone Encounter (Signed)
Requested medication (s) are due for refill today:   Yes  Requested medication (s) are on the active medication list:   Yes  Future visit scheduled:   Yes in 1 mo with Janeece Agee   Last ordered: 11/25/2020 #30, 3 refills  Clinic note:  Returning because pharmacy is requesting a DX Code and a 90 day supply.   Requested Prescriptions  Pending Prescriptions Disp Refills   traZODone (DESYREL) 50 MG tablet [Pharmacy Med Name: TRAZODONE 50 MG TABLET] 90 tablet 2    Sig: TAKE 0.5-1 TABLETS BY MOUTH AT BEDTIME AS NEEDED FOR SLEEP.      Psychiatry: Antidepressants - Serotonin Modulator Passed - 12/30/2020 12:31 PM      Passed - Valid encounter within last 6 months    Recent Outpatient Visits           1 month ago Uncontrolled type 2 diabetes mellitus with hyperglycemia (HCC)   [facility], Richard, NP   4 months ago Uncontrolled type 2 diabetes mellitus with hyperglycemia [facility])   [facility], Gerlene Burdock, NP   5 months ago Acute non-recurrent frontal sinusitis   Primary Care at [facility], Meda Coffee, MD   6 months ago New onset type 2 diabetes mellitus [facility])   [facility], Gerlene Burdock, NP   7 months ago New onset type 2 diabetes mellitus [facility])   Primary Care at [facility], Sandria Bales, MD       Future Appointments             In 1 month Janeece Agee, NP Primary Care at [place], [facility]

## 2021-01-01 NOTE — Telephone Encounter (Signed)
Patient also needs a new referral for Psychiatry because the one she was referred to is booked until the middle of the summer. Medication is not appropriate for refill.    Patient is requesting a refill of the following medications: Requested Prescriptions   Pending Prescriptions Disp Refills  . traZODone (DESYREL) 50 MG tablet [Pharmacy Med Name: TRAZODONE 50 MG TABLET] 90 tablet 2    Sig: TAKE 0.5-1 TABLETS BY MOUTH AT BEDTIME AS NEEDED FOR SLEEP.    Date of patient request: 12/30/2020 Last office visit: 11/24/2020 Date of last refill: 11/25/2020 Last refill amount: 30 tablets 3 refill Follow up time period per chart: 02/20/2021

## 2021-01-04 DIAGNOSIS — F4321 Adjustment disorder with depressed mood: Secondary | ICD-10-CM | POA: Diagnosis not present

## 2021-01-09 DIAGNOSIS — F4321 Adjustment disorder with depressed mood: Secondary | ICD-10-CM | POA: Diagnosis not present

## 2021-01-17 DIAGNOSIS — F4321 Adjustment disorder with depressed mood: Secondary | ICD-10-CM | POA: Diagnosis not present

## 2021-01-31 DIAGNOSIS — F4321 Adjustment disorder with depressed mood: Secondary | ICD-10-CM | POA: Diagnosis not present

## 2021-02-14 DIAGNOSIS — F4321 Adjustment disorder with depressed mood: Secondary | ICD-10-CM | POA: Diagnosis not present

## 2021-02-20 ENCOUNTER — Encounter: Payer: Self-pay | Admitting: Registered Nurse

## 2021-02-20 ENCOUNTER — Other Ambulatory Visit: Payer: Self-pay

## 2021-02-20 ENCOUNTER — Ambulatory Visit: Payer: Medicaid Other | Admitting: Registered Nurse

## 2021-02-20 VITALS — BP 128/84 | HR 81 | Temp 98.1°F | Resp 16 | Ht 66.0 in | Wt 248.4 lb

## 2021-02-20 DIAGNOSIS — F4321 Adjustment disorder with depressed mood: Secondary | ICD-10-CM | POA: Diagnosis not present

## 2021-02-20 DIAGNOSIS — F419 Anxiety disorder, unspecified: Secondary | ICD-10-CM | POA: Diagnosis not present

## 2021-02-20 DIAGNOSIS — G479 Sleep disorder, unspecified: Secondary | ICD-10-CM | POA: Diagnosis not present

## 2021-02-20 DIAGNOSIS — M79641 Pain in right hand: Secondary | ICD-10-CM

## 2021-02-20 DIAGNOSIS — Z8709 Personal history of other diseases of the respiratory system: Secondary | ICD-10-CM | POA: Diagnosis not present

## 2021-02-20 DIAGNOSIS — I10 Essential (primary) hypertension: Secondary | ICD-10-CM | POA: Diagnosis not present

## 2021-02-20 DIAGNOSIS — E119 Type 2 diabetes mellitus without complications: Secondary | ICD-10-CM

## 2021-02-20 DIAGNOSIS — E1165 Type 2 diabetes mellitus with hyperglycemia: Secondary | ICD-10-CM | POA: Diagnosis not present

## 2021-02-20 LAB — POCT GLYCOSYLATED HEMOGLOBIN (HGB A1C): Hemoglobin A1C: 11.3 % — AB (ref 4.0–5.6)

## 2021-02-20 MED ORDER — METFORMIN HCL 1000 MG PO TABS: 1.0000 | ORAL_TABLET | Freq: Two times a day (BID) | ORAL | 1 refills | Status: DC

## 2021-02-20 MED ORDER — HYDROXYZINE HCL 10 MG PO TABS: 10.0000 mg | ORAL_TABLET | Freq: Three times a day (TID) | ORAL | 2 refills | Status: DC | PRN

## 2021-02-20 MED ORDER — DULAGLUTIDE 0.75 MG/0.5ML ~~LOC~~ SOAJ: 0.7500 mg | SUBCUTANEOUS | 0 refills | Status: DC

## 2021-02-20 MED ORDER — LOSARTAN POTASSIUM 50 MG PO TABS: 50.0000 mg | ORAL_TABLET | Freq: Every day | ORAL | 3 refills | Status: DC

## 2021-02-20 MED ORDER — ALBUTEROL SULFATE HFA 108 (90 BASE) MCG/ACT IN AERS: 2.0000 | INHALATION_SPRAY | RESPIRATORY_TRACT | 3 refills | Status: DC | PRN

## 2021-02-20 MED ORDER — DICLOFENAC SODIUM 1 % EX GEL: 2.0000 g | Freq: Four times a day (QID) | CUTANEOUS | 0 refills | Status: DC

## 2021-02-20 MED ORDER — ACCU-CHEK GUIDE VI STRP: ORAL_STRIP | 4 refills | Status: DC

## 2021-02-20 MED ORDER — SIMVASTATIN 20 MG PO TABS: 20.0000 mg | ORAL_TABLET | Freq: Every day | ORAL | 3 refills | Status: DC

## 2021-02-20 MED ORDER — TRAZODONE HCL 50 MG PO TABS: ORAL_TABLET | ORAL | 2 refills | Status: DC

## 2021-02-20 NOTE — Progress Notes (Signed)
Established Patient Office Visit  Subjective:  Patient ID: Denise [name], female    DOB: [birth year]  Age: 30 y.o. MRN: [id number]  CC:  Chief Complaint  Patient presents with  . Diabetes    Pt doing okay has been losing weight no concerns at this time  . Hand Pain    Pt reports hand pain x 3 weeks, was cleaning thinks she jammed the finger, pointer on Rt hand. No notable swelling, no heat or tenderness to touch, has been resting to try helping this but no improvement     HPI Denise [name] presents for t2dm  Last A1c:  Lab Results  Component Value Date   HGBA1C 8.4 (A) 11/24/2020    Currently taking: .med No new complications Reports good compliance with medications Diet has been improved. Fewer fried foods  Exercise habits have been steady.  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 Not seeking worker's comp. First digit in R hand. Pain. Limited ROM due to pain. No swelling or discoloration. No changes to sensation.  Hx of carpal tunnel, this feels different.   Past Medical History:  Diagnosis Date  . Allergy   . Arthritis   . Asthma   . Gestational diabetes   . Hx of chlamydia infection 2017  . Pregnancy induced hypertension    Pre-E  . Seasonal allergies   . Trichomonas 2017    Past Surgical History:  Procedure Laterality Date  . CERVICAL CERCLAGE N/A 11/23/2018   Procedure: CERCLAGE CERVICAL;  Surgeon: Reva Bores, MD;  Location: [facility] BIRTHING SUITES;  Service: Gynecology;  Laterality: N/A;    Family History  Problem Relation Age of Onset  . Arthritis Mother   . Diabetes Mother   . Diabetes Maternal Grandmother   . Arthritis Maternal Grandmother     Social History   Socioeconomic History  . Marital status: Single    Spouse name: Not on file  . Number of children: 1  . Years of education: Not on file  . Highest education level: Not on file  Occupational History  .  Occupation: Childcare  Tobacco Use  . Smoking status: Never Smoker  . Smokeless tobacco: Never Used  Vaping Use  . Vaping Use: Never used  Substance and Sexual Activity  . Alcohol use: No  . Drug use: No  . Sexual activity: Yes    Partners: Male    Birth control/protection: None    Comment: with monagamous partner   Other Topics Concern  . Not on file  Social History Narrative   Pt is from [place]. She lives in [place] with her mother and child.    Social Determinants of Health   Financial Resource Strain: Not on file  Food Insecurity: Not on file  Transportation Needs: Not on file  Physical Activity: Not on file  Stress: Not on file  Social Connections: Not on file  Intimate Partner Violence: Not on file    Outpatient Medications Prior to Visit  Medication Sig Dispense Refill  . albuterol (VENTOLIN HFA) 108 (90 Base) MCG/ACT inhaler Inhale 2 puffs into the lungs every 4 (four) hours as needed for wheezing. 18 g 3  . glucose blood (ACCU-CHEK GUIDE) test strip Check blood sugars once daily before meals 100 each 4  . hydrOXYzine (ATARAX/VISTARIL) 10 MG tablet Take 1 tablet (10 mg total) by mouth 3 (three) times daily as needed. 30  tablet 0  . losartan (COZAAR) 50 MG tablet Take 1 tablet (50 mg total) by mouth daily. 90 tablet 3  . metFORMIN (GLUCOPHAGE) 1000 MG tablet TAKE 1 TABLET BY MOUTH TWICE A DAY WITH MEALS 180 tablet 0  . simvastatin (ZOCOR) 20 MG tablet Take 1 tablet (20 mg total) by mouth at bedtime. 90 tablet 3  . traZODone (DESYREL) 50 MG tablet TAKE 0.5-1 TABLETS BY MOUTH AT BEDTIME AS NEEDED FOR SLEEP. 90 tablet 2   No facility-administered medications prior to visit.    Allergies  Allergen Reactions  . Marcelline Deist [Dapagliflozin] Nausea And Vomiting    ROS Review of Systems  Constitutional: Negative.   HENT: Negative.   Eyes: Negative.   Respiratory: Negative.   Cardiovascular: Negative.   Gastrointestinal: Negative.   Endocrine: Negative.    Genitourinary: Negative.   Musculoskeletal: Negative.   Skin: Negative.   Allergic/Immunologic: Negative.   Neurological: Negative.   Hematological: Negative.   Psychiatric/Behavioral: Negative.   All other systems reviewed and are negative.     Objective:    Physical Exam Vitals and nursing note reviewed.  Constitutional:      General: She is not in acute distress.    Appearance: Normal appearance. She is normal weight. She is not ill-appearing, toxic-appearing or diaphoretic.  Cardiovascular:     Rate and Rhythm: Normal rate and regular rhythm.     Heart sounds: Normal heart sounds. No murmur heard. No friction rub. No gallop.   Pulmonary:     Effort: Pulmonary effort is normal. No respiratory distress.     Breath sounds: Normal breath sounds. No stridor. No wheezing, rhonchi or rales.  Chest:     Chest wall: No tenderness.  Musculoskeletal:        General: Swelling, tenderness and signs of injury present. No deformity. Normal range of motion.     Right lower leg: No edema.     Left lower leg: No edema.     Comments: First digit R hand  Skin:    General: Skin is warm and dry.  Neurological:     General: No focal deficit present.     Mental Status: She is alert and oriented to person, place, and time. Mental status is at baseline.  Psychiatric:        Mood and Affect: Mood normal.        Behavior: Behavior normal.        Thought Content: Thought content normal.        Judgment: Judgment normal.     BP 128/84   Pulse 81   Temp 98.1 F (36.7 C) (Temporal)   Resp 16   Ht 5\' 6"  (1.676 m)   Wt 248 lb 6.4 oz (112.7 kg)   SpO2 99%   BMI 40.09 kg/m  Wt Readings from Last 3 Encounters:  02/20/21 248 lb 6.4 oz (112.7 kg)  11/24/20 256 lb 12.8 oz (116.5 kg)  08/23/20 250 lb 3.2 oz (113.5 kg)     Health Maintenance Due  Topic Date Due  . PAP SMEAR-Modifier  12/31/2020    There are no preventive care reminders to display for this patient.  Lab Results   Component Value Date   TSH 1.120 06/01/2020   Lab Results  Component Value Date   WBC 4.3 11/24/2020   HGB 12.1 11/24/2020   HCT 37.9 11/24/2020   MCV 72 (L) 11/24/2020   PLT 333 06/01/2020   Lab Results  Component Value Date   NA  136 11/24/2020   K 4.9 11/24/2020   CO2 21 11/24/2020   GLUCOSE 205 (H) 11/24/2020   BUN 7 11/24/2020   CREATININE 0.87 11/24/2020   BILITOT <0.2 11/24/2020   ALKPHOS 90 11/24/2020   AST 15 11/24/2020   ALT 15 11/24/2020   PROT 7.4 11/24/2020   ALBUMIN 4.1 11/24/2020   CALCIUM 9.5 11/24/2020   ANIONGAP 14 05/26/2020   Lab Results  Component Value Date   CHOL 158 06/01/2020   Lab Results  Component Value Date   HDL 71 06/01/2020   Lab Results  Component Value Date   LDLCALC 70 06/01/2020   Lab Results  Component Value Date   TRIG 92 06/01/2020   Lab Results  Component Value Date   CHOLHDL 2.2 06/01/2020   Lab Results  Component Value Date   HGBA1C 8.4 (A) 11/24/2020      Assessment & Plan:   Problem List Items Addressed This Visit   None   Visit Diagnoses    Uncontrolled type 2 diabetes mellitus with hyperglycemia (HCC)    -  Primary   Relevant Medications   losartan (COZAAR) 50 MG tablet   simvastatin (ZOCOR) 20 MG tablet   metFORMIN (GLUCOPHAGE) 1000 MG tablet   Other Relevant Orders   POCT glycosylated hemoglobin (Hb A1C)   Right hand pain       Relevant Medications   diclofenac Sodium (VOLTAREN) 1 % GEL   Other Relevant Orders   Ambulatory referral to Hand Surgery   Essential hypertension       Relevant Medications   losartan (COZAAR) 50 MG tablet   simvastatin (ZOCOR) 20 MG tablet   New onset type 2 diabetes mellitus (HCC)       Relevant Medications   losartan (COZAAR) 50 MG tablet   simvastatin (ZOCOR) 20 MG tablet   glucose blood (ACCU-CHEK GUIDE) test strip   metFORMIN (GLUCOPHAGE) 1000 MG tablet   History of asthma       Relevant Medications   albuterol (VENTOLIN HFA) 108 (90 Base) MCG/ACT inhaler    Acute anxiety       Relevant Medications   hydrOXYzine (ATARAX/VISTARIL) 10 MG tablet   traZODone (DESYREL) 50 MG tablet   Sleep disturbance       Relevant Medications   traZODone (DESYREL) 50 MG tablet      Meds ordered this encounter  Medications  . diclofenac Sodium (VOLTAREN) 1 % GEL    Sig: Apply 2 g topically 4 (four) times daily.    Dispense:  100 g    Refill:  0    Order Specific Question:   Supervising Provider    Answer:   Neva SeatGREENE, JEFFREY R [[id number]]  . losartan (COZAAR) 50 MG tablet    Sig: Take 1 tablet (50 mg total) by mouth daily.    Dispense:  90 tablet    Refill:  3    Order Specific Question:   Supervising Provider    Answer:   Neva SeatGREENE, JEFFREY R [[id number]]  . simvastatin (ZOCOR) 20 MG tablet    Sig: Take 1 tablet (20 mg total) by mouth at bedtime.    Dispense:  90 tablet    Refill:  3    Order Specific Question:   Supervising Provider    Answer:   Neva SeatGREENE, JEFFREY R [[id number]]  . albuterol (VENTOLIN HFA) 108 (90 Base) MCG/ACT inhaler    Sig: Inhale 2 puffs into the lungs every 4 (four) hours as needed for wheezing.  Dispense:  18 g    Refill:  3    Order Specific Question:   Supervising Provider    Answer:   Neva Seat, JEFFREY R [[id number]]  . hydrOXYzine (ATARAX/VISTARIL) 10 MG tablet    Sig: Take 1 tablet (10 mg total) by mouth 3 (three) times daily as needed.    Dispense:  30 tablet    Refill:  2    Order Specific Question:   Supervising Provider    Answer:   Neva Seat, JEFFREY R [[id number]]  . glucose blood (ACCU-CHEK GUIDE) test strip    Sig: Check blood sugars once daily before meals    Dispense:  100 each    Refill:  4    Pt requested accu chek guide A510    Order Specific Question:   Supervising Provider    Answer:   Neva Seat, JEFFREY R [[id number]]  . metFORMIN (GLUCOPHAGE) 1000 MG tablet    Sig: Take 1 tablet (1,000 mg total) by mouth 2 (two) times daily with a meal.    Dispense:  180 tablet    Refill:  1    Order Specific Question:   Supervising Provider    Answer:    Neva Seat, JEFFREY R [[id number]]  . traZODone (DESYREL) 50 MG tablet    Sig: TAKE 0.5-1 TABLETS BY MOUTH AT BEDTIME AS NEEDED FOR SLEEP.    Dispense:  90 tablet    Refill:  2    Order Specific Question:   Supervising Provider    Answer:   Neva Seat, JEFFREY R [[id number]]    Follow-up: No follow-ups on file.   PLAN  Today's a1c at 11.3. unsure of cause of rise as Pt endorses good diet and exercise.  Start trulicity 0.75mg  subq weekly.  Follow up in 3 mo  Hand pain: concern for underlying fracture. Will refer to hand specialist. Diclofenac topical for pain relief. Recommend RICE as well.  Great improvement on weight loss - 8lb in past 3 mo. Pt feeling much better.  Patient encouraged to call clinic with any questions, comments, or concerns.  Janeece Agee, NP

## 2021-02-22 DIAGNOSIS — S63630A Sprain of interphalangeal joint of right index finger, initial encounter: Secondary | ICD-10-CM | POA: Diagnosis not present

## 2021-02-22 DIAGNOSIS — W010XXA Fall on same level from slipping, tripping and stumbling without subsequent striking against object, initial encounter: Secondary | ICD-10-CM | POA: Diagnosis not present

## 2021-03-06 DIAGNOSIS — F4321 Adjustment disorder with depressed mood: Secondary | ICD-10-CM | POA: Diagnosis not present

## 2021-04-13 ENCOUNTER — Telehealth: Payer: Medicaid Other | Admitting: Physician Assistant

## 2021-04-13 ENCOUNTER — Telehealth: Payer: Self-pay

## 2021-04-13 DIAGNOSIS — R102 Pelvic and perineal pain: Secondary | ICD-10-CM

## 2021-04-13 DIAGNOSIS — R300 Dysuria: Secondary | ICD-10-CM

## 2021-04-13 DIAGNOSIS — N898 Other specified noninflammatory disorders of vagina: Secondary | ICD-10-CM

## 2021-04-13 NOTE — Progress Notes (Signed)
Based on what you shared with me, I feel your condition warrants further evaluation and I recommend that you be seen for a face to face visit. Giving multiple symptoms that can make it hard to distinguish BV from yeast and mild UTI and concern that you have more than one issue present, I recommend you be seen in person for exam and urine/vaginal testing.   Please contact your primary care physician practice to be seen. Many offices offer virtual options to be seen via video if you are not comfortable going in person to a medical facility at this time.  If you do not have a PCP, [facility] offers a free physician referral service available at [phone]. Our trained staff has the experience, knowledge and resources to put you in touch with a physician who is right for you.   You also have the option of a video visit through [email or web address]  If you are having a true medical emergency please call 911.  NOTE: If you entered your credit card information for this eVisit, you will not be charged. You may see a "hold" on your card for the $35 but that hold will drop off and you will not have a charge processed.  Your e-visit answers were reviewed by a board certified advanced clinical practitioner to complete your personal care plan.  Thank you for using e-Visits.

## 2021-04-13 NOTE — Telephone Encounter (Signed)
Nothing more at this time, but the medication should work within a day or so. Antibiotics like macrobid and flagyl not indicated at this time.   Thank you  Rich

## 2021-04-13 NOTE — Telephone Encounter (Signed)
Patient informed of recommendations listed below by PCP. Aware to call back on Tuesday if symptoms still present.

## 2021-04-13 NOTE — Telephone Encounter (Signed)
Patient called in stating she just completed an EVisit.  States she believes she has a yeast infection.  Current symptoms:  Vaginal exema, clear discharge,  burning from itching, swelling  States she has medicaid and can not get treated at many places.    States she has an OBGYN appt to establish care in June.    Patient states she had 1 fluconazole 150mg .  States she just took this 30 minutes ago.  States she is still very irritated.  Would like to know if there is anything else she can do to treat?

## 2021-04-13 NOTE — Telephone Encounter (Signed)
Pt was wanting to know if Macrobid, Ziflucan, and flazyl   Please advise

## 2021-05-10 DIAGNOSIS — N898 Other specified noninflammatory disorders of vagina: Secondary | ICD-10-CM | POA: Diagnosis not present

## 2021-05-10 DIAGNOSIS — B373 Candidiasis of vulva and vagina: Secondary | ICD-10-CM | POA: Diagnosis not present

## 2021-05-10 DIAGNOSIS — N760 Acute vaginitis: Secondary | ICD-10-CM | POA: Diagnosis not present

## 2021-05-10 DIAGNOSIS — L292 Pruritus vulvae: Secondary | ICD-10-CM | POA: Diagnosis not present

## 2021-06-08 ENCOUNTER — Ambulatory Visit: Payer: Medicaid Other | Admitting: Registered Nurse

## 2021-06-08 ENCOUNTER — Other Ambulatory Visit: Payer: Self-pay

## 2021-06-08 ENCOUNTER — Encounter: Payer: Self-pay | Admitting: Registered Nurse

## 2021-06-08 VITALS — BP 110/72 | HR 79 | Temp 98.0°F | Resp 18 | Ht 66.0 in | Wt 229.0 lb

## 2021-06-08 DIAGNOSIS — E119 Type 2 diabetes mellitus without complications: Secondary | ICD-10-CM

## 2021-06-08 DIAGNOSIS — E1165 Type 2 diabetes mellitus with hyperglycemia: Secondary | ICD-10-CM

## 2021-06-08 DIAGNOSIS — R718 Other abnormality of red blood cells: Secondary | ICD-10-CM | POA: Diagnosis not present

## 2021-06-08 NOTE — Patient Instructions (Addendum)
[name] -   Unfortunately A1c has risen again At 14.5 today  Will refer you to endocrinology - they'll call you  I will refill meds x 3 mo  Thank you  Rich     If you have lab work done today you will be contacted with your lab results within the next 2 weeks.  If you have not heard from [place] then please contact us. The fastest way to get your results is to register for My Chart.   IF you received an x-ray today, you will receive an invoice from [facility]. Please contact [facility] at [phone] with questions or concerns regarding your invoice.   IF you received labwork today, you will receive an invoice from [facility]. Please contact [facility] at [phone] with questions or concerns regarding your invoice.   Our billing staff will not be able to assist you with questions regarding bills from these companies.  You will be contacted with the lab results as soon as they are available. The fastest way to get your results is to activate your My Chart account. Instructions are located on the last page of this paperwork. If you have not heard from [place] regarding the results in 2 weeks, please contact this office.

## 2021-06-12 ENCOUNTER — Encounter: Payer: Self-pay | Admitting: Registered Nurse

## 2021-06-21 DIAGNOSIS — F4323 Adjustment disorder with mixed anxiety and depressed mood: Secondary | ICD-10-CM | POA: Diagnosis not present

## 2021-06-26 DIAGNOSIS — Z124 Encounter for screening for malignant neoplasm of cervix: Secondary | ICD-10-CM | POA: Diagnosis not present

## 2021-06-26 DIAGNOSIS — A5901 Trichomonal vulvovaginitis: Secondary | ICD-10-CM | POA: Diagnosis not present

## 2021-06-26 DIAGNOSIS — Z113 Encounter for screening for infections with a predominantly sexual mode of transmission: Secondary | ICD-10-CM | POA: Diagnosis not present

## 2021-06-26 DIAGNOSIS — Z1151 Encounter for screening for human papillomavirus (HPV): Secondary | ICD-10-CM | POA: Diagnosis not present

## 2021-06-28 DIAGNOSIS — F4323 Adjustment disorder with mixed anxiety and depressed mood: Secondary | ICD-10-CM | POA: Diagnosis not present

## 2021-07-05 DIAGNOSIS — F4323 Adjustment disorder with mixed anxiety and depressed mood: Secondary | ICD-10-CM | POA: Diagnosis not present

## 2021-07-12 DIAGNOSIS — F4323 Adjustment disorder with mixed anxiety and depressed mood: Secondary | ICD-10-CM | POA: Diagnosis not present

## 2021-07-24 DIAGNOSIS — F4321 Adjustment disorder with depressed mood: Secondary | ICD-10-CM | POA: Diagnosis not present

## 2021-08-04 DIAGNOSIS — K047 Periapical abscess without sinus: Secondary | ICD-10-CM | POA: Diagnosis not present

## 2021-08-09 DIAGNOSIS — F4321 Adjustment disorder with depressed mood: Secondary | ICD-10-CM | POA: Diagnosis not present

## 2021-09-01 NOTE — Progress Notes (Signed)
Established Patient Office Visit  Subjective:  Patient ID: Denise [name], female    DOB: 08/03/1991  Age: 30 y.o. MRN: [id number]  CC:  Chief Complaint  Patient presents with   Follow-up    Patient states she is here for a follow up on diabetes.    HPI Denise [name] presents for t2dm  Last A1c:  Lab Results  Component Value Date   HGBA1C 11.3 (A) 02/20/2021    Currently taking: metformin 1000mg  po bid ac. Unfortunately unable to afford trulicity. No new complications Reports good compliance with medications Diet has been improved, fewer processed foods, more water. Exercise habits have been steady, limited.   Past Medical History:  Diagnosis Date   Allergy    Arthritis    Asthma    Gestational diabetes    Hx of chlamydia infection 2017   Pregnancy induced hypertension    Pre-E   Seasonal allergies    Trichomonas 2017    Past Surgical History:  Procedure Laterality Date   CERVICAL CERCLAGE N/A 11/23/2018   Procedure: CERCLAGE CERVICAL;  Surgeon: 01/22/2019, MD;  Location: [facility] BIRTHING SUITES;  Service: Gynecology;  Laterality: N/A;    Family History  Problem Relation Age of Onset   Arthritis Mother    Diabetes Mother    Diabetes Maternal Grandmother    Arthritis Maternal Grandmother     Social History   Socioeconomic History   Marital status: Single    Spouse name: Not on file   Number of children: 1   Years of education: Not on file   Highest education level: Not on file  Occupational History   Occupation: Childcare  Tobacco Use   Smoking status: Never   Smokeless tobacco: Never  Vaping Use   Vaping Use: Never used  Substance and Sexual Activity   Alcohol use: No   Drug use: No   Sexual activity: Yes    Partners: Male    Birth control/protection: None    Comment: with monagamous partner   Other Topics Concern   Not on file  Social History Narrative   Pt is from [facility]. She lives in [place] with her mother and child.     Social Determinants of Health   Financial Resource Strain: Not on file  Food Insecurity: Not on file  Transportation Needs: Not on file  Physical Activity: Not on file  Stress: Not on file  Social Connections: Not on file  Intimate Partner Violence: Not on file    Outpatient Medications Prior to Visit  Medication Sig Dispense Refill   losartan (COZAAR) 50 MG tablet Take 1 tablet (50 mg total) by mouth daily. 90 tablet 3   metFORMIN (GLUCOPHAGE) 1000 MG tablet Take 1 tablet (1,000 mg total) by mouth 2 (two) times daily with a meal. 180 tablet 1   simvastatin (ZOCOR) 20 MG tablet Take 1 tablet (20 mg total) by mouth at bedtime. 90 tablet 3   Dulaglutide 0.75 MG/0.5ML SOPN Inject 0.75 mg into the skin once a week. (Patient not taking: Reported on 06/08/2021) 6 mL 0   albuterol (VENTOLIN HFA) 108 (90 Base) MCG/ACT inhaler Inhale 2 puffs into the lungs every 4 (four) hours as needed for wheezing. (Patient not taking: Reported on 06/08/2021) 18 g 3   diclofenac Sodium (VOLTAREN) 1 % GEL Apply 2 g topically 4 (four) times daily. (Patient not taking: Reported on 06/08/2021) 100 g 0   glucose blood (ACCU-CHEK GUIDE) test strip Check blood sugars once  daily before meals (Patient not taking: Reported on 06/08/2021) 100 each 4   hydrOXYzine (ATARAX/VISTARIL) 10 MG tablet Take 1 tablet (10 mg total) by mouth 3 (three) times daily as needed. (Patient not taking: Reported on 06/08/2021) 30 tablet 2   traZODone (DESYREL) 50 MG tablet TAKE 0.5-1 TABLETS BY MOUTH AT BEDTIME AS NEEDED FOR SLEEP. (Patient not taking: Reported on 06/08/2021) 90 tablet 2   No facility-administered medications prior to visit.    Allergies  Allergen Reactions   Farxiga [Dapagliflozin] Nausea And Vomiting    ROS Review of Systems  Constitutional: Negative.   HENT: Negative.    Eyes: Negative.   Respiratory: Negative.    Cardiovascular: Negative.   Gastrointestinal: Negative.   Genitourinary: Negative.   Musculoskeletal:  Negative.   Skin: Negative.   Neurological: Negative.   Psychiatric/Behavioral: Negative.    All other systems reviewed and are negative.    Objective:    Physical Exam Vitals and nursing note reviewed.  Constitutional:      General: She is not in acute distress.    Appearance: Normal appearance. She is normal weight. She is not ill-appearing, toxic-appearing or diaphoretic.  Cardiovascular:     Rate and Rhythm: Normal rate and regular rhythm.     Heart sounds: Normal heart sounds. No murmur heard.   No friction rub. No gallop.  Pulmonary:     Effort: Pulmonary effort is normal. No respiratory distress.     Breath sounds: Normal breath sounds. No stridor. No wheezing, rhonchi or rales.  Chest:     Chest wall: No tenderness.  Skin:    General: Skin is warm and dry.  Neurological:     General: No focal deficit present.     Mental Status: She is alert and oriented to person, place, and time. Mental status is at baseline.  Psychiatric:        Mood and Affect: Mood normal.        Behavior: Behavior normal.        Thought Content: Thought content normal.        Judgment: Judgment normal.    BP 110/72   Pulse 79   Temp 98 F (36.7 C) (Temporal)   Resp 18   Ht 5\' 6"  (1.676 m)   Wt 229 lb (103.9 kg)   SpO2 99%   BMI 36.96 kg/m  Wt Readings from Last 3 Encounters:  06/08/21 229 lb (103.9 kg)  02/20/21 248 lb 6.4 oz (112.7 kg)  11/24/20 256 lb 12.8 oz (116.5 kg)     Health Maintenance Due  Topic Date Due   Hepatitis C Screening  Never done   PAP-Cervical Cytology Screening  04/21/2016   PAP SMEAR-Modifier  12/31/2020   COVID-19 Vaccine (3 - Booster for Pfizer series) 03/04/2021   INFLUENZA VACCINE  06/18/2021    There are no preventive care reminders to display for this patient.  Lab Results  Component Value Date   TSH 1.120 06/01/2020   Lab Results  Component Value Date   WBC 4.3 11/24/2020   HGB 12.1 11/24/2020   HCT 37.9 11/24/2020   MCV 72 (L)  11/24/2020   PLT 333 06/01/2020   Lab Results  Component Value Date   NA 136 11/24/2020   K 4.9 11/24/2020   CO2 21 11/24/2020   GLUCOSE 205 (H) 11/24/2020   BUN 7 11/24/2020   CREATININE 0.87 11/24/2020   BILITOT <0.2 11/24/2020   ALKPHOS 90 11/24/2020   AST 15 11/24/2020  ALT 15 11/24/2020   PROT 7.4 11/24/2020   ALBUMIN 4.1 11/24/2020   CALCIUM 9.5 11/24/2020   ANIONGAP 14 05/26/2020   Lab Results  Component Value Date   CHOL 158 06/01/2020   Lab Results  Component Value Date   HDL 71 06/01/2020   Lab Results  Component Value Date   LDLCALC 70 06/01/2020   Lab Results  Component Value Date   TRIG 92 06/01/2020   Lab Results  Component Value Date   CHOLHDL 2.2 06/01/2020   Lab Results  Component Value Date   HGBA1C 11.3 (A) 02/20/2021      Assessment & Plan:   Problem List Items Addressed This Visit   None Visit Diagnoses     Uncontrolled type 2 diabetes mellitus with hyperglycemia (HCC)    -  Primary   Relevant Orders   Ambulatory referral to Endocrinology   New onset type 2 diabetes mellitus (HCC)       Relevant Orders   POCT HgB A1C   RBC microcytosis           No orders of the defined types were placed in this encounter.   Follow-up: Return in about 3 months (around 09/08/2021) for t2dm.   PLAN Sugars still uncontrolled. Will refer to endo. Pt may benefit from diabetic education but would prefer to start at endo Return in 3 mo Patient encouraged to call clinic with any questions, comments, or concerns.  Janeece Agee, NP

## 2021-09-07 ENCOUNTER — Ambulatory Visit: Payer: Medicaid Other | Admitting: Internal Medicine

## 2021-09-09 NOTE — Telephone Encounter (Signed)
This concern has been previously addressed by myself and/or another provider.  If they patient has ongoing concerns, they can contact me at their convenience.  Thank you,  Rich Mccauley Diehl, NP 

## 2021-09-22 DIAGNOSIS — F4323 Adjustment disorder with mixed anxiety and depressed mood: Secondary | ICD-10-CM | POA: Diagnosis not present

## 2021-10-02 DIAGNOSIS — F4323 Adjustment disorder with mixed anxiety and depressed mood: Secondary | ICD-10-CM | POA: Diagnosis not present

## 2021-10-26 ENCOUNTER — Ambulatory Visit: Payer: Medicaid Other | Admitting: Internal Medicine

## 2021-10-26 ENCOUNTER — Encounter: Payer: Self-pay | Admitting: Internal Medicine

## 2021-10-26 ENCOUNTER — Other Ambulatory Visit: Payer: Self-pay

## 2021-10-26 VITALS — BP 110/70 | HR 74 | Ht 66.0 in | Wt 220.0 lb

## 2021-10-26 DIAGNOSIS — E785 Hyperlipidemia, unspecified: Secondary | ICD-10-CM

## 2021-10-26 DIAGNOSIS — E1165 Type 2 diabetes mellitus with hyperglycemia: Secondary | ICD-10-CM | POA: Diagnosis not present

## 2021-10-26 LAB — POCT GLYCOSYLATED HEMOGLOBIN (HGB A1C): Hemoglobin A1C: 14.6 % — AB (ref 4.0–5.6)

## 2021-10-26 LAB — LIPID PANEL
Cholesterol: 157 mg/dL (ref 0–200)
HDL: 81.6 mg/dL (ref >39.00)
LDL Cholesterol: 55 mg/dL (ref 0–99)
NonHDL: 74.92
Total CHOL/HDL Ratio: 2
Triglycerides: 102 mg/dL (ref 0.0–149.0)
VLDL: 20.4 mg/dL (ref 0.0–40.0)

## 2021-10-26 LAB — BASIC METABOLIC PANEL
BUN: 11 mg/dL (ref 6–23)
CO2: 23 mEq/L (ref 19–32)
Calcium: 9.5 mg/dL (ref 8.4–10.5)
Chloride: 100 mEq/L (ref 96–112)
Creatinine, Ser: 0.82 mg/dL (ref 0.40–1.20)
GFR: 96.51 mL/min (ref >60.00)
Glucose, Bld: 383 mg/dL — ABNORMAL HIGH (ref 70–99)
Potassium: 4.8 mEq/L (ref 3.5–5.1)
Sodium: 131 mEq/L — ABNORMAL LOW (ref 135–145)

## 2021-10-26 MED ORDER — TRULICITY 0.75 MG/0.5ML ~~LOC~~ SOAJ: 0.7500 mg | SUBCUTANEOUS | 4 refills | Status: DC

## 2021-10-26 MED ORDER — METFORMIN HCL ER 750 MG PO TB24: 1500.0000 mg | ORAL_TABLET | Freq: Every day | ORAL | 3 refills | Status: DC

## 2021-10-26 NOTE — Patient Instructions (Signed)
Change Metformin to 750 mg XR , take 2 tablets together  Start Trulicity 0.75 mg once weekly      HOW TO TREAT LOW BLOOD SUGARS (Blood sugar LESS THAN 70 MG/DL) Please follow the RULE OF 15 for the treatment of hypoglycemia treatment (when your (blood sugars are less than 70 mg/dL)   STEP 1: Take 15 grams of carbohydrates when your blood sugar is low, which includes:  3-4 GLUCOSE TABS  OR 3-4 OZ OF JUICE OR REGULAR SODA OR ONE TUBE OF GLUCOSE GEL    STEP 2: RECHECK blood sugar in 15 MINUTES STEP 3: If your blood sugar is still low at the 15 minute recheck --> then, go back to STEP 1 and treat AGAIN with another 15 grams of carbohydrates.

## 2021-10-26 NOTE — Progress Notes (Signed)
Name: Denise [name]  MRN/ DOB: [id number], 27-Apr-1991   Age/ Sex: 30 y.o., female    PCP: Janeece Agee, NP   Reason for Endocrinology Evaluation: Type 2 Diabetes Mellitus     Date of Initial Endocrinology Visit: 10/26/2021     PATIENT IDENTIFIER: Denise [name] is a 30 y.o. female with a past medical history of T2DM. The patient presented for initial endocrinology clinic visit on 10/26/2021 for consultative assistance with her diabetes management.    HPI: Denise [name] was    Diagnosed with DM 2021 Prior Medications tried/Intolerance:  Intolerant to [place] - nausea and vomiting Currently checking blood sugars 0 x / day Hemoglobin A1c has ranged from 8.4% in 2022, peaking at 14.5% in 2021. Patient required assistance for hypoglycemia:  Patient has required hospitalization within the last 1 year from hyper or hypoglycemia: 05/2020 with a serum glucose of 629 mg/dL   In terms of diet, the patient eats 2-3 a day, snacks 2-3 . Drinks sugar-sweetened beverages   Denise [name] forgets to take her evening Metformin  Denise [name] works 7 pm to 7 am . Works for the [employer]. Has meal replacement for breakfast and eats lunch and supper ( lunch 12AM to 3 AM )   HOME DIABETES REGIMEN: Metformin 1000 mg BID    Statin: yes ACE-I/ARB: yes Prior Diabetic Education: no    METER DOWNLOAD SUMMARY: did not bring    DIABETIC COMPLICATIONS: Microvascular complications:   Denies: retinopathy, retinopathy, neuropathy  Last eye exam: Completed 07/2020  Macrovascular complications:   Denies: CAD, PVD, CVA   PAST HISTORY: Past Medical History:  Past Medical History:  Diagnosis Date   Allergy    Arthritis    Asthma    Gestational diabetes    Hx of chlamydia infection 2017   Pregnancy induced hypertension    Pre-E   Seasonal allergies    Trichomonas 2017   Past Surgical History:  Past Surgical History:  Procedure Laterality Date   CERVICAL CERCLAGE N/A 11/23/2018   Procedure: CERCLAGE  CERVICAL;  Surgeon: Reva Bores, MD;  Location: [facility] BIRTHING SUITES;  Service: Gynecology;  Laterality: N/A;    Social History:  reports that Denise [name] has never smoked. Denise [name] has never used smokeless tobacco. Denise [name] reports that Denise [name] does not drink alcohol and does not use drugs. Family History:  Family History  Problem Relation Age of Onset   Arthritis Mother    Diabetes Mother    Diabetes Maternal Grandmother    Arthritis Maternal Grandmother      HOME MEDICATIONS: Allergies as of 10/26/2021       Reactions   Farxiga [dapagliflozin] Nausea And Vomiting        Medication List        Accurate as of October 26, 2021  9:16 AM. If you have any questions, ask your nurse or doctor.          Dulaglutide 0.75 MG/0.5ML Sopn Inject 0.75 mg into the skin once a week.   losartan 50 MG tablet Commonly known as: COZAAR Take 1 tablet (50 mg total) by mouth daily.   metFORMIN 1000 MG tablet Commonly known as: GLUCOPHAGE Take 1 tablet (1,000 mg total) by mouth 2 (two) times daily with a meal.   simvastatin 20 MG tablet Commonly known as: ZOCOR Take 1 tablet (20 mg total) by mouth at bedtime.         ALLERGIES: Allergies  Allergen Reactions   Farxiga [Dapagliflozin] Nausea And Vomiting  REVIEW OF SYSTEMS: A comprehensive ROS was conducted with the patient and is negative except as per HPI and below:  Review of Systems  Gastrointestinal:  Negative for diarrhea, nausea and vomiting.  Genitourinary:  Positive for frequency.  Endo/Heme/Allergies:  Negative for polydipsia.     OBJECTIVE:   VITAL SIGNS: BP 110/70   Pulse 74   Ht 5\' 6"  (1.676 m)   Wt 220 lb (99.8 kg)   SpO2 99%   BMI 35.51 kg/m    PHYSICAL EXAM:  General: Pt appears well and is in NAD  Neck: General: Supple without adenopathy or carotid bruits. Thyroid: Thyroid size normal.  No goiter or nodules appreciated. No thyroid bruit.  Lungs: Clear with good BS bilat with no rales, rhonchi, or wheezes   Heart: RRR with normal S1 and S2 and no gallops; no murmurs; no rub  Abdomen: Normoactive bowel sounds, soft, nontender, without masses or organomegaly palpable  Extremities:  Lower extremities - No pretibial edema. No lesions.  Skin: Normal texture and temperature to palpation. No rash noted. No Acanthosis nigricans/skin tags. No lipohypertrophy.  Neuro: MS is good with appropriate affect, pt is alert and Ox3    DATA REVIEWED:  Lab Results  Component Value Date   HGBA1C 11.3 (A) 02/20/2021   HGBA1C 8.4 (A) 11/24/2020   HGBA1C 9.2 (A) 08/23/2020   Lab Results  Component Value Date   LDLCALC 70 06/01/2020   CREATININE 0.87 11/24/2020   No results found for: MICRALBCREAT  Lab Results  Component Value Date   CHOL 158 06/01/2020   HDL 71 06/01/2020   LDLCALC 70 06/01/2020   TRIG 92 06/01/2020   CHOLHDL 2.2 06/01/2020        ASSESSMENT / PLAN / RECOMMENDATIONS:   1) Type 2 Diabetes Mellitus, Poorly controlled, Without complications - Most recent A1c of 14.6 %. Goal A1c < 7.0 %.    Plan: GENERAL: I have discussed with the patient the pathophysiology of diabetes. We went over the natural progression of the disease. We talked about both insulin resistance and insulin deficiency. We stressed the importance of lifestyle changes including diet and exercise. I explained the complications associated with diabetes including retinopathy, nephropathy, neuropathy as well as increased risk of cardiovascular disease. We went over the benefit seen with glycemic control.   I explained to the patient that diabetic patients are at higher than normal risk for amputations.   We had a long discussion about how Denise [name] could adjust her meals to her 12 hour shifts, Denise [name] is having issues managing her work hours and diabetes care. So Denise [name] has been missing her 2nd dose of Metformin . I have recommended changing it to XR , that way Denise [name] can take two tablets together . Denise [name] had trulicity on her med list from  02/2021 pt states Denise [name] never tired and no one discussed this medicine with her and believe that her PCP may have mixed her chart with someone else  Per notes, Trulicity was cost prohibitive  I have advised the pt on starting insulin per ADA recommendations with an A1c >10.0% her response  Denise [name]  " I will not take it " Denise [name] snacks on trail mix and sun chips, I have offered a referral to our CDE but Denise [name] declines  Pt states Denise [name] had "no plans in coming  here today " as Denise [name] knows what Denise [name] has to do and would like to work on her lifestyle changes  Denise [name] was not happy when I asked her  to have labs done today, I explained to her that Denise [name] is behind on labs and we expressed the need to check cholesterol and MA/cr ratio to make sure there's no renal damage Denise [name] reluctantly agreed for me to prescribe Trulicity, cautioend against GI side effects   MEDICATIONS: Metformin XR 750 mg Two tablets daily  Start Trulicity  0.75 mg   EDUCATION / INSTRUCTIONS: BG monitoring instructions: Patient is instructed to check her blood sugars 1 times daily . Call [facility] if: BG persistently < 70  I reviewed the Rule of 15 for the treatment of hypoglycemia in detail with the patient. Literature supplied.   2) Diabetic complications:  Eye: Does not have known diabetic retinopathy.  Neuro/ Feet: Does not have known diabetic peripheral neuropathy. Renal: Patient does not have known baseline CKD. Denise [name] is not on an ACEI/ARB at present.   3) Dyslipidemia :   - Pt on Simvastatin 20 mg daily  -Lipid panel is normal, no changes  F/U in 3 months  Signed electronically by: Lyndle Herrlich, MD  [facility] End[facility]  [facility] [address] [place] [place], [place] [address] Phone: [phone] FAX: [phone]   CC: Janeece Agee, NP [address] [place] [address] Phone: [phone]  Fax: [phone]    Return to Endocrinology clinic as below: No future  appointments.

## 2021-11-26 ENCOUNTER — Other Ambulatory Visit: Payer: Self-pay

## 2021-11-26 ENCOUNTER — Telehealth: Payer: Self-pay

## 2021-11-26 ENCOUNTER — Ambulatory Visit (INDEPENDENT_AMBULATORY_CARE_PROVIDER_SITE_OTHER): Payer: Medicaid Other | Admitting: Endocrinology

## 2021-11-26 ENCOUNTER — Other Ambulatory Visit: Payer: Self-pay | Admitting: Registered Nurse

## 2021-11-26 DIAGNOSIS — E1165 Type 2 diabetes mellitus with hyperglycemia: Secondary | ICD-10-CM

## 2021-11-26 DIAGNOSIS — E11319 Type 2 diabetes mellitus with unspecified diabetic retinopathy without macular edema: Secondary | ICD-10-CM | POA: Diagnosis not present

## 2021-11-26 MED ORDER — TRULICITY 0.75 MG/0.5ML ~~LOC~~ SOAJ: 0.7500 mg | SUBCUTANEOUS | 3 refills | Status: DC

## 2021-11-26 NOTE — Patient Instructions (Addendum)
Please continue the same metformin.   I have sent a prescription to your pharmacy, for the Trulicity.  check your blood sugar once a day.  vary the time of day when you check, between before the 3 meals, and at bedtime.  also check if you have symptoms of your blood sugar being too high or too low.  please keep a record of the readings and bring it to your next appointment here (or you can bring the meter itself).  You can write it on any piece of paper.  please call us sooner if your blood sugar goes below 70, or if most of your readings are over 200.  Please come back for a follow-up appointment in 1 month.

## 2021-11-26 NOTE — Telephone Encounter (Signed)
Possibly endocrinologist? That's the only specialist I think she's seen recently.  Can replace referral and I'll co-sign if that's the case.  Thanks,  Denice Paradise

## 2021-11-26 NOTE — Progress Notes (Signed)
Subjective:    Patient ID: Denise [name], female    DOB: [birth year], 31 y.o.   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  Therapy: metformin.  GDM: never DKA: never Severe hypoglycemia: never Pancreatitis: never Pancreatic imaging: none known SDOH: none Other: she has never been on insulin Interval history: pt says she could not pick up Trulicity, due to the line being too long.  She says cbg's are approx 300.   Past Medical History:  Diagnosis Date   Allergy    Arthritis    Asthma    Gestational diabetes    Hx of chlamydia infection 2017   Pregnancy induced hypertension    Pre-E   Seasonal allergies    Trichomonas 2017    Past Surgical History:  Procedure Laterality Date   CERVICAL CERCLAGE N/A 11/23/2018   Procedure: CERCLAGE CERVICAL;  Surgeon: Reva Bores, MD;  Location: [facility] BIRTH[facility] SUIT[facility];  Service: Gynecology;  Laterality: N/A;    Social History   Socioeconomic History   Marital status: Single    Spouse name: Not on file   Number of children: 1   Years of education: Not on file   Highest education level: Not on file  Occupational History   Occupation: Childcare  Tobacco Use   Smoking status: Never   Smokeless tobacco: Never  Vaping Use   Vaping Use: Never used  Substance and Sexual Activity   Alcohol use: No   Drug use: No   Sexual activity: Yes    Partners: Male    Birth control/protection: None    Comment: with monagamous partner   Other Topics Concern   Not on file  Social History Narrative   Pt is from [place]. She lives in [place] with her mother and child.    Social Determinants of Health   Financial Resource Strain: Not on file  Food Insecurity: Not on file  Transportation Needs: Not on file  Physical Activity: Not on file  Stress: Not on file  Social Connections: Not on file  Intimate Partner Violence: Not on file    Current Outpatient  Medications on File Prior to Visit  Medication Sig Dispense Refill   losartan (COZAAR) 50 MG tablet Take 1 tablet (50 mg total) by mouth daily. 90 tablet 3   metFORMIN (GLUCOPHAGE-XR) 750 MG 24 hr tablet Take 2 tablets (1,500 mg total) by mouth daily with breakfast. 180 tablet 3   simvastatin (ZOCOR) 20 MG tablet Take 1 tablet (20 mg total) by mouth at bedtime. 90 tablet 3   No current facility-administered medications on file prior to visit.    Allergies  Allergen Reactions   Farxiga [Dapagliflozin] Nausea And Vomiting    Family History  Problem Relation Age of Onset   Arthritis Mother    Diabetes Mother    Diabetes Maternal Grandmother    Arthritis Maternal Grandmother     BP 100/60    Pulse 74    Ht 5\' 6"  (1.676 m)    Wt 223 lb 6.4 oz (101.3 kg)    SpO2 99%    BMI 36.06 kg/m      Review of Systems Denies N/V/HB.      Objective:   Physical Exam  Lab Results  Component Value Date   CREATININE 0.82 10/26/2021   BUN 11 10/26/2021   NA 131 (L) 10/26/2021   K 4.8 10/26/2021   CL 100 10/26/2021  CO2 23 10/26/2021     Lab Results  Component Value Date   HGBA1C 14.6 (A) 10/26/2021      Assessment & Plan:  Type 2 DM: very poor glycemic control.  She declines insulin.  I advised her of risks.  I advised ref CDE, to learn about injections, but she declines.   Patient Instructions  Please continue the same metformin.   I have sent a prescription to your pharmacy, for the Trulicity.  check your blood sugar once a day.  vary the time of day when you check, between before the 3 meals, and at bedtime.  also check if you have symptoms of your blood sugar being too high or too low.  please keep a record of the readings and bring it to your next appointment here (or you can bring the meter itself).  You can write it on any piece of paper.  please call us sooner if your blood sugar goes below 70, or if most of your readings are over 200.  Please come back for a follow-up  appointment in 1 month.

## 2021-11-26 NOTE — Telephone Encounter (Signed)
Caller name:Shelli Ranell Patrick Ander Slade)  On DPR? :Yes  Call back number:[phone]  Provider they see: Gerlene Burdock   Reason for call:[facility] she is not comfortable with this office and wants to be sent to a different Oncologist outside [facility]

## 2021-11-26 NOTE — Telephone Encounter (Signed)
Yes, it is endocrinology.

## 2021-11-26 NOTE — Telephone Encounter (Signed)
Referral placed  Thanks  Rich

## 2021-11-27 DIAGNOSIS — O24112 Pre-existing diabetes mellitus, type 2, in pregnancy, second trimester: Secondary | ICD-10-CM | POA: Insufficient documentation

## 2021-11-27 DIAGNOSIS — O24113 Pre-existing diabetes mellitus, type 2, in pregnancy, third trimester: Secondary | ICD-10-CM | POA: Insufficient documentation

## 2021-11-27 DIAGNOSIS — E119 Type 2 diabetes mellitus without complications: Secondary | ICD-10-CM | POA: Insufficient documentation

## 2021-12-24 DIAGNOSIS — G5602 Carpal tunnel syndrome, left upper limb: Secondary | ICD-10-CM | POA: Diagnosis not present

## 2021-12-31 ENCOUNTER — Other Ambulatory Visit: Payer: Self-pay

## 2021-12-31 ENCOUNTER — Ambulatory Visit: Payer: Medicaid Other | Admitting: Endocrinology

## 2021-12-31 ENCOUNTER — Encounter: Payer: Self-pay | Admitting: Endocrinology

## 2021-12-31 VITALS — BP 138/90 | HR 71 | Ht 66.0 in | Wt 217.6 lb

## 2021-12-31 DIAGNOSIS — E11319 Type 2 diabetes mellitus with unspecified diabetic retinopathy without macular edema: Secondary | ICD-10-CM

## 2021-12-31 MED ORDER — METFORMIN HCL ER 500 MG PO TB24: 1500.0000 mg | ORAL_TABLET | Freq: Every day | ORAL | 3 refills | Status: DC

## 2021-12-31 MED ORDER — GLIMEPIRIDE 4 MG PO TABS: 4.0000 mg | ORAL_TABLET | Freq: Every day | ORAL | 3 refills | Status: DC

## 2021-12-31 NOTE — Patient Instructions (Addendum)
I have sent a prescription to your pharmacy, to add glimepride.    Please continue the same other medications.   In view of your medical condition, you should avoid pregnancy until we have decided it is safe.   check your blood sugar once a day.  vary the time of day when you check, between before the 3 meals, and at bedtime.  also check if you have symptoms of your blood sugar being too high or too low.  please keep a record of the readings and bring it to your next appointment here (or you can bring the meter itself).  You can write it on any piece of paper.  please call us sooner if your blood sugar goes below 70, or if most of your readings are over 200.   Please come back for a follow-up appointment in 1 month.

## 2021-12-31 NOTE — Progress Notes (Signed)
Subjective:    Patient ID: Denise [name], female    DOB: April 20, 1991, 31 y.o.   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  Therapy: metformin and Trulicity. GDM: never DKA: never Severe hypoglycemia: never Pancreatitis: never Pancreatic imaging: none known.   SDOH: none Other: she has never been on insulin;  Interval history: Trulicity was causing nausea, but that is less now She says cbg's are approx 200.  She takes meds as rx'ed Past Medical History:  Diagnosis Date   Allergy    Arthritis    Asthma    Gestational diabetes    Hx of chlamydia infection 2017   Pregnancy induced hypertension    Pre-E   Seasonal allergies    Trichomonas 2017    Past Surgical History:  Procedure Laterality Date   CERVICAL CERCLAGE N/A 11/23/2018   Procedure: CERCLAGE CERVICAL;  Surgeon: Donnamae Jude, MD;  Location: [facility];  Service: Gynecology;  Laterality: N/A;    Social History   Socioeconomic History   Marital status: Single    Spouse name: Not on file   Number of children: 1   Years of education: Not on file   Highest education level: Not on file  Occupational History   Occupation: Childcare  Tobacco Use   Smoking status: Never   Smokeless tobacco: Never  Vaping Use   Vaping Use: Never used  Substance and Sexual Activity   Alcohol use: No   Drug use: No   Sexual activity: Yes    Partners: Male    Birth control/protection: None    Comment: with monagamous partner   Other Topics Concern   Not on file  Social History Narrative   Pt is from [place]. She lives in [place] with her mother and child.    Social Determinants of Health   Financial Resource Strain: Not on file  Food Insecurity: Not on file  Transportation Needs: Not on file  Physical Activity: Not on file  Stress: Not on file  Social Connections: Not on file  Intimate Partner Violence: Not on file    Current  Outpatient Medications on File Prior to Visit  Medication Sig Dispense Refill   Dulaglutide (TRULICITY) A999333 [id number] SOPN Inject 0.75 mg into the skin once a week. 6 mL 3   losartan (COZAAR) 50 MG tablet Take 1 tablet (50 mg total) by mouth daily. 90 tablet 3   simvastatin (ZOCOR) 20 MG tablet Take 1 tablet (20 mg total) by mouth at bedtime. 90 tablet 3   No current facility-administered medications on file prior to visit.    Allergies  Allergen Reactions   Farxiga [Dapagliflozin] Nausea And Vomiting    Family History  Problem Relation Age of Onset   Arthritis Mother    Diabetes Mother    Diabetes Maternal Grandmother    Arthritis Maternal Grandmother     BP 138/90 (BP Location: Left Arm, Patient Position: Sitting, Cuff Size: Normal)    Pulse 71    Ht 5\' 6"  (1.676 m)    Wt 217 lb 9.6 oz (98.7 kg)    SpO2 98%    BMI 35.12 kg/m    Review of Systems     Objective:   Physical Exam       Assessment & Plan:  Type 2 DM: uncontrolled  Patient Instructions  I have sent a prescription to your pharmacy, to add glimepride.  Please continue the same other medications.   In view of your medical condition, you should avoid pregnancy until we have decided it is safe.   check your blood sugar once a day.  vary the time of day when you check, between before the 3 meals, and at bedtime.  also check if you have symptoms of your blood sugar being too high or too low.  please keep a record of the readings and bring it to your next appointment here (or you can bring the meter itself).  You can write it on any piece of paper.  please call us sooner if your blood sugar goes below 70, or if most of your readings are over 200.   Please come back for a follow-up appointment in 1 month.

## 2022-01-22 ENCOUNTER — Ambulatory Visit: Payer: Medicaid Other | Admitting: Orthopedic Surgery

## 2022-01-22 ENCOUNTER — Ambulatory Visit (INDEPENDENT_AMBULATORY_CARE_PROVIDER_SITE_OTHER): Payer: Medicaid Other

## 2022-01-22 ENCOUNTER — Other Ambulatory Visit: Payer: Self-pay

## 2022-01-22 DIAGNOSIS — M79642 Pain in left hand: Secondary | ICD-10-CM | POA: Diagnosis not present

## 2022-01-22 DIAGNOSIS — R200 Anesthesia of skin: Secondary | ICD-10-CM | POA: Diagnosis not present

## 2022-01-22 DIAGNOSIS — R202 Paresthesia of skin: Secondary | ICD-10-CM

## 2022-01-22 NOTE — Progress Notes (Signed)
? ?Office Visit Note ?  ?Patient: Denise [name]           ?Date of Birth: [birth year]           ?MRN: [id number] ?Visit Date: 01/22/2022 ?             ?Requested by: Maximiano Coss, NP ?[address] ?[place],  [place] [address] ?PCP: Maximiano Coss, NP ? ? ?Assessment & Plan: ?Visit Diagnoses:  ?1. Pain in left hand   ?2. Numbness and tingling in left hand   ? ? ?Plan: Discussed with patient that the distribution of her numbness and paresthesias along with positive provocative signs seem consistent with cubital tunnel syndrome.  We discussed the nature of cubital tunnel syndrome including its cause, prognosis, and both conservative and surgical treatment options.  Like to get a nerve conduction study/EMG to further evaluate her symptoms.  I will see her back in the office once the study is completed. ? ?Follow-Up Instructions: No follow-ups on file.  ? ?Orders:  ?Orders Placed This Encounter  ?Procedures  ? XR Hand Complete Left  ? ?No orders of the defined types were placed in this encounter. ? ? ? ? Procedures: ?No procedures performed ? ? ?Clinical Data: ?No additional findings. ? ? ?Subjective: ?Chief Complaint  ?Patient presents with  ? Left Hand - Pain  ?  Pain a long pinky side of hand and long ring finger, pain does radiate up to the elbow at times  ? ? ?Is a 31 year old right-hand-dominant female who presents with numbness and paresthesias of the left small finger and ulnar half of the ring finger.  Is been going on for 4 weeks now.  She denies any injury.  She just woke up with the symptoms.  She works for the [employer] system and has difficulty with opening certain doors secondary to numbness and paresthesias.  She also has burning pain that radiates from the small finger proximally up into the ulnar aspect of the arm.  She denies any issue with the thumb, index, or middle finger.  She has a an unusual sleep given that she works mostly at night and is unsure if she has nocturnal or sleep-related  symptoms. ? ? ?Review of Systems ? ? ?Objective: ?Vital Signs: There were no vitals taken for this visit. ? ?Physical Exam ?Constitutional:   ?   Appearance: Normal appearance.  ?Cardiovascular:  ?   Rate and Rhythm: Normal rate.  ?   Pulses: Normal pulses.  ?Pulmonary:  ?   Effort: Pulmonary effort is normal.  ?Skin: ?   General: Skin is warm and dry.  ?   Capillary Refill: Capillary refill takes less than 2 seconds.  ?Neurological:  ?   Mental Status: She is alert.  ? ? ?Left Hand Exam  ? ?Tenderness  ?The patient is experiencing no tenderness.  ? ?Range of Motion  ?The patient has normal left wrist ROM. ? ?Other  ?Erythema: absent ?Sensation: normal ?Pulse: present ? ?Comments:  Negative Tinel or Phalen at wrist.  5/5 FDI strength.  2PD 63mm in small finger and 63mm in remainder of fingers.  ? ? ?Left Elbow Exam  ? ?Tenderness  ?The patient is experiencing no tenderness.  ? ?Range of Motion  ?The patient has normal left elbow ROM. ? ?Other  ?Erythema: absent ?Sensation: normal ?Pulse: present ? ?Comments:  + Tinel at elbow without evidence of ulnar nerve instability/subluxation.  ? ? ? ? ?Specialty Comments:  ?No specialty comments  available. ? ?Imaging: ?No results found. ? ? ?PMFS History: ?Patient Active Problem List  ? Diagnosis Date Noted  ? Numbness and tingling in left hand 01/22/2022  ? Diabetes ([place]) 11/27/2021  ? Group B Streptococcus carrier, delivered 01/03/2019  ? Preterm premature rupture of membranes (PPROM) delivered 01/01/2019  ? Chorioamnionitis in second trimester 01/01/2019  ? Hypertension complicating pregnancy 0000000  ? Gestational diabetes mellitus (GDM) in second trimester 10/22/2018  ? Supervision of high-risk pregnancy 10/19/2018  ? Short cervix with cervical cerclage, antepartum, second trimester 07/28/2013  ? Needle phobia 05/19/2013  ? Asthma 04/29/2012  ? ?Past Medical History:  ?Diagnosis Date  ? Allergy   ? Arthritis   ? Asthma   ? Gestational diabetes   ? Hx of chlamydia  infection 2017  ? Pregnancy induced hypertension   ? Pre-E  ? Seasonal allergies   ? Trichomonas 2017  ?  ?Family History  ?Problem Relation Age of Onset  ? Arthritis Mother   ? Diabetes Mother   ? Diabetes Maternal Grandmother   ? Arthritis Maternal Grandmother   ?  ?Past Surgical History:  ?Procedure Laterality Date  ? CERVICAL CERCLAGE N/A 11/23/2018  ? Procedure: CERCLAGE CERVICAL;  Surgeon: Donnamae Jude, MD;  Location: [facility];  Service: Gynecology;  Laterality: N/A;  ? ?Social History  ? ?Occupational History  ? Occupation: Childcare  ?Tobacco Use  ? Smoking status: Never  ? Smokeless tobacco: Never  ?Vaping Use  ? Vaping Use: Never used  ?Substance and Sexual Activity  ? Alcohol use: No  ? Drug use: No  ? Sexual activity: Yes  ?  Partners: Male  ?  Birth control/protection: None  ?  Comment: with monagamous partner   ? ? ? ? ? ? ?

## 2022-01-31 ENCOUNTER — Ambulatory Visit: Payer: Medicaid Other | Admitting: Endocrinology

## 2022-02-11 ENCOUNTER — Telehealth: Payer: Self-pay | Admitting: Physical Medicine and Rehabilitation

## 2022-02-11 NOTE — Telephone Encounter (Signed)
Pt called requesting a call back. Pt states she need to cancel. Please call pt at [phone]. ?

## 2022-02-12 ENCOUNTER — Encounter: Payer: Self-pay | Admitting: Physical Medicine and Rehabilitation

## 2022-02-12 ENCOUNTER — Other Ambulatory Visit: Payer: Self-pay

## 2022-02-12 ENCOUNTER — Ambulatory Visit: Payer: Medicaid Other | Admitting: Physical Medicine and Rehabilitation

## 2022-02-12 DIAGNOSIS — R202 Paresthesia of skin: Secondary | ICD-10-CM | POA: Diagnosis not present

## 2022-02-12 NOTE — Progress Notes (Signed)
? ?Denise [name] - 31 y.o. female MRN [id number]  Date of birth: 06-20-1991 ? ?Office Visit Note: ?Visit Date: 02/12/2022 ?PCP: Maximiano Coss, NP ?Referred by: Sherilyn Cooter, MD ? ?Subjective: ?Chief Complaint  ?Patient presents with  ? Left Hand - Numbness  ? ?HPI:  Denise [name] is a 31 y.o. female who comes in today at the request of Dr. Sherilyn Cooter for electrodiagnostic study of the Left upper extremities.  Patient is Right hand dominant. She presents with numbness and paresthesias of the left small finger and ulnar half of the ring finger.  She reports several months of worsening symptoms without specific injury or onset.  She reports just waking up with the symptoms.  She works for the [employer] and has difficulty with opening certain doors secondary to numbness and paresthesias.  She also has burning pain that radiates from the small finger proximally up into the ulnar aspect of the arm.   ? ?ROS Otherwise per HPI. ? ?Assessment & Plan: ?Visit Diagnoses:  ?  ICD-10-CM   ?1. Paresthesia of skin  R20.2 NCV with EMG (electromyography)  ?  ?  ?Plan: Impression: ?Essentially NORMAL electrodiagnostic study of the left upper limb.  There is no significant electrodiagnostic evidence of nerve entrapment, brachial plexopathy or cervical radiculopathy.   ? ?As you know, purely sensory or demyelinating radiculopathies and chemical radiculitis may not be detected with this particular electrodiagnostic study. ? ?Recommendations: ?1.  Follow-up with referring physician. ?2.  Continue current management of symptoms. ? ?Meds & Orders: No orders of the defined types were placed in this encounter. ?  ?Orders Placed This Encounter  ?Procedures  ? NCV with EMG (electromyography)  ?  ?Follow-up: Return in about 2 weeks (around 02/26/2022) for Sherilyn Cooter, MD.  ? ?Procedures: ?No procedures performed  ?EMG & NCV Findings: ?Evaluation of the left median (across palm) sensory nerve showed prolonged distal peak  latency (Wrist, 3.9 ms) and prolonged distal peak latency (Palm, 2.2 ms).  The left ulnar sensory nerve showed prolonged distal peak latency (4.0 ms) and decreased conduction velocity (Wrist-5th Digit, 35 m/s).  All remaining nerves (as indicated in the following tables) were within normal limits.   ? ?All examined muscles (as indicated in the following table) showed no evidence of electrical instability.   ? ?Impression: ?Essentially NORMAL electrodiagnostic study of the left upper limb.  There is no significant electrodiagnostic evidence of nerve entrapment, brachial plexopathy or cervical radiculopathy.   ? ?As you know, purely sensory or demyelinating radiculopathies and chemical radiculitis may not be detected with this particular electrodiagnostic study. ? ?Recommendations: ?1.  Follow-up with referring physician. ?2.  Continue current management of symptoms. ? ?___________________________ ?Laurence Spates FAAPMR ?Board Certified, Tax adviser of Physical Medicine and Rehabilitation ? ? ? ?Nerve Conduction Studies ?Anti Sensory Summary Table ? ? Stim Site NR Peak (ms) Norm Peak (ms) P-T Amp (?V) Norm P-T Amp Site1 Site2 Delta-P (ms) Dist (cm) Vel (m/s) Norm Vel (m/s)  ?Left Median Acr Palm Anti Sensory (2nd Digit)  28.9?C  ?Wrist    *3.9 <3.6 59.1 >10 Wrist Palm 1.7 0.0    ?Palm    *2.2 <2.0 231.4         ?Left Radial Anti Sensory (Base 1st Digit)  29.2?C  ?Wrist    2.1 <3.1 18.8  Wrist Base 1st Digit 2.1 0.0    ?Left Ulnar Anti Sensory (5th Digit)  29.4?C  ?Wrist    *4.0 <3.7 42.5 >15.0 Wrist 5th Digit  4.0 14.0 *35 >38  ? ?Motor Summary Table ? ? Stim Site NR Onset (ms) Norm Onset (ms) O-P Amp (mV) Norm O-P Amp Site1 Site2 Delta-0 (ms) Dist (cm) Vel (m/s) Norm Vel (m/s)  ?Left Median Motor (Abd Poll Brev)  29.5?C  ?Wrist    3.7 <4.2 10.1 >5 Elbow Wrist 4.4 24.0 55 >50  ?Elbow    8.1  9.4         ?Left Ulnar Motor (Abd Dig Min)  29.5?C  ?Wrist    3.6 <4.2 13.2 >3 B Elbow Wrist 4.4 24.0 55 >53  ?B Elbow    8.0   10.8  A Elbow B Elbow 1.1 10.0 91 >53  ?A Elbow    9.1  11.3         ? ?EMG ? ? Side Muscle Nerve Root Ins Act Fibs Psw Amp Dur Poly Recrt Int Fraser Din Comment  ?Left Abd Poll Brev Median C8-T1 Nml Nml Nml Nml Nml 0 Nml Nml   ?Left 1stDorInt Ulnar C8-T1 Nml Nml Nml Nml Nml 0 Nml Nml   ?Left PronatorTeres Median C6-7 Nml Nml Nml Nml Nml 0 Nml Nml   ?Left Biceps Musculocut C5-6 Nml Nml Nml Nml Nml 0 Nml Nml   ?Left Deltoid Axillary C5-6 Nml Nml Nml Nml Nml 0 Nml Nml   ? ? ?Nerve Conduction Studies ?Anti Sensory Left/Right Comparison ? ? Stim Site L Lat (ms) R Lat (ms) L-R Lat (ms) L Amp (?V) R Amp (?V) L-R Amp (%) Site1 Site2 L Vel (m/s) R Vel (m/s) L-R Vel (m/s)  ?Median Acr Palm Anti Sensory (2nd Digit)  28.9?C  ?Wrist *3.9   59.1   Wrist Palm     ?Palm *2.2   231.4         ?Radial Anti Sensory (Base 1st Digit)  29.2?C  ?Wrist 2.1   18.8   Wrist Base 1st Digit     ?Ulnar Anti Sensory (5th Digit)  29.4?C  ?Wrist *4.0   42.5   Wrist 5th Digit *35    ? ?Motor Left/Right Comparison ? ? Stim Site L Lat (ms) R Lat (ms) L-R Lat (ms) L Amp (mV) R Amp (mV) L-R Amp (%) Site1 Site2 L Vel (m/s) R Vel (m/s) L-R Vel (m/s)  ?Median Motor (Abd Poll Brev)  29.5?C  ?Wrist 3.7   10.1   Elbow Wrist 55    ?Elbow 8.1   9.4         ?Ulnar Motor (Abd Dig Min)  29.5?C  ?Wrist 3.6   13.2   B Elbow Wrist 55    ?B Elbow 8.0   10.8   A Elbow B Elbow 91    ?A Elbow 9.1   11.3         ? ? ? ?Waveforms: ?    ? ?   ? ?  ? ?Clinical History: ?No specialty comments available.  ? ? ? ?Objective:  VS:  HT:    WT:   BMI:     BP:   HR: bpm  TEMP: ( )  RESP:  ?Physical Exam ?Musculoskeletal:     ?   General: No swelling, tenderness or deformity.  ?   Comments: Inspection reveals no atrophy of the bilateral APB or FDI or hand intrinsics. There is no swelling, color changes, allodynia or dystrophic changes. There is 5 out of 5 strength in the bilateral wrist extension, finger abduction and long finger flexion. There is intact sensation to light touch  in  all dermatomal and peripheral nerve distributions. There is a negative Hoffmann's test bilaterally.  ?Skin: ?   General: Skin is warm and dry.  ?   Findings: No erythema or rash.  ?Neurological:  ?   General: No focal deficit present.  ?   Mental Status: She is alert and oriented to person, place, and time.  ?   Motor: No weakness or abnormal muscle tone.  ?   Coordination: Coordination normal.  ?Psychiatric:     ?   Mood and Affect: Mood normal.     ?   Behavior: Behavior normal.  ?  ? ?Imaging: ?No results found. ?

## 2022-02-12 NOTE — Progress Notes (Signed)
Pt state numbness in her left hand. Pt state she right handed. ? ?Numeric Pain Rating Scale and Functional Assessment ?Average Pain 5 ? ? ?In the last MONTH (on 0-10 scale) has pain interfered with the following? ? ?1. General activity like being  able to carry out your everyday physical activities such as walking, climbing stairs, carrying groceries, or moving a chair?  ?Rating(10) ? ? ? ? ?

## 2022-02-17 NOTE — Procedures (Signed)
EMG & NCV Findings: ?Evaluation of the left median (across palm) sensory nerve showed prolonged distal peak latency (Wrist, 3.9 ms) and prolonged distal peak latency (Palm, 2.2 ms).  The left ulnar sensory nerve showed prolonged distal peak latency (4.0 ms) and decreased conduction velocity (Wrist-5th Digit, 35 m/s).  All remaining nerves (as indicated in the following tables) were within normal limits.   ? ?All examined muscles (as indicated in the following table) showed no evidence of electrical instability.   ? ?Impression: ?Essentially NORMAL electrodiagnostic study of the left upper limb.  There is no significant electrodiagnostic evidence of nerve entrapment, brachial plexopathy or cervical radiculopathy.   ? ?As you know, purely sensory or demyelinating radiculopathies and chemical radiculitis may not be detected with this particular electrodiagnostic study. ? ?Recommendations: ?1.  Follow-up with referring physician. ?2.  Continue current management of symptoms. ? ?___________________________ ?Laurence Spates FAAPMR ?Board Certified, Tax adviser of Physical Medicine and Rehabilitation ? ? ? ?Nerve Conduction Studies ?Anti Sensory Summary Table ? ? Stim Site NR Peak (ms) Norm Peak (ms) P-T Amp (?V) Norm P-T Amp Site1 Site2 Delta-P (ms) Dist (cm) Vel (m/s) Norm Vel (m/s)  ?Left Median Acr Palm Anti Sensory (2nd Digit)  28.9?C  ?Wrist    *3.9 <3.6 59.1 >10 Wrist Palm 1.7 0.0    ?Palm    *2.2 <2.0 231.4         ?Left Radial Anti Sensory (Base 1st Digit)  29.2?C  ?Wrist    2.1 <3.1 18.8  Wrist Base 1st Digit 2.1 0.0    ?Left Ulnar Anti Sensory (5th Digit)  29.4?C  ?Wrist    *4.0 <3.7 42.5 >15.0 Wrist 5th Digit 4.0 14.0 *35 >38  ? ?Motor Summary Table ? ? Stim Site NR Onset (ms) Norm Onset (ms) O-P Amp (mV) Norm O-P Amp Site1 Site2 Delta-0 (ms) Dist (cm) Vel (m/s) Norm Vel (m/s)  ?Left Median Motor (Abd Poll Brev)  29.5?C  ?Wrist    3.7 <4.2 10.1 >5 Elbow Wrist 4.4 24.0 55 >50  ?Elbow    8.1  9.4         ?Left  Ulnar Motor (Abd Dig Min)  29.5?C  ?Wrist    3.6 <4.2 13.2 >3 B Elbow Wrist 4.4 24.0 55 >53  ?B Elbow    8.0  10.8  A Elbow B Elbow 1.1 10.0 91 >53  ?A Elbow    9.1  11.3         ? ?EMG ? ? Side Muscle Nerve Root Ins Act Fibs Psw Amp Dur Poly Recrt Int Fraser Din Comment  ?Left Abd Poll Brev Median C8-T1 Nml Nml Nml Nml Nml 0 Nml Nml   ?Left 1stDorInt Ulnar C8-T1 Nml Nml Nml Nml Nml 0 Nml Nml   ?Left PronatorTeres Median C6-7 Nml Nml Nml Nml Nml 0 Nml Nml   ?Left Biceps Musculocut C5-6 Nml Nml Nml Nml Nml 0 Nml Nml   ?Left Deltoid Axillary C5-6 Nml Nml Nml Nml Nml 0 Nml Nml   ? ? ?Nerve Conduction Studies ?Anti Sensory Left/Right Comparison ? ? Stim Site L Lat (ms) R Lat (ms) L-R Lat (ms) L Amp (?V) R Amp (?V) L-R Amp (%) Site1 Site2 L Vel (m/s) R Vel (m/s) L-R Vel (m/s)  ?Median Acr Palm Anti Sensory (2nd Digit)  28.9?C  ?Wrist *3.9   59.1   Wrist Palm     ?Palm *2.2   231.4         ?Radial Anti Sensory (Base 1st  Digit)  29.2?C  ?Wrist 2.1   18.8   Wrist Base 1st Digit     ?Ulnar Anti Sensory (5th Digit)  29.4?C  ?Wrist *4.0   42.5   Wrist 5th Digit *35    ? ?Motor Left/Right Comparison ? ? Stim Site L Lat (ms) R Lat (ms) L-R Lat (ms) L Amp (mV) R Amp (mV) L-R Amp (%) Site1 Site2 L Vel (m/s) R Vel (m/s) L-R Vel (m/s)  ?Median Motor (Abd Poll Brev)  29.5?C  ?Wrist 3.7   10.1   Elbow Wrist 55    ?Elbow 8.1   9.4         ?Ulnar Motor (Abd Dig Min)  29.5?C  ?Wrist 3.6   13.2   B Elbow Wrist 55    ?B Elbow 8.0   10.8   A Elbow B Elbow 91    ?A Elbow 9.1   11.3         ? ? ? ?Waveforms: ?    ? ?   ? ? ?

## 2022-03-21 ENCOUNTER — Ambulatory Visit: Payer: Medicaid Other | Admitting: Endocrinology

## 2022-03-26 ENCOUNTER — Telehealth: Payer: Self-pay | Admitting: Internal Medicine

## 2022-03-26 NOTE — Telephone Encounter (Signed)
Patient requests to be called asap at ph# [phone] re: Patient states she is pregnant and is afraid to take her medications for Diabetes (Trulicity, Glimepiride and Metformin). Patient states she is high risk. ?

## 2022-03-27 ENCOUNTER — Encounter (HOSPITAL_COMMUNITY): Payer: Self-pay | Admitting: *Deleted

## 2022-03-27 ENCOUNTER — Inpatient Hospital Stay (HOSPITAL_COMMUNITY): Payer: Medicaid Other

## 2022-03-27 ENCOUNTER — Inpatient Hospital Stay (HOSPITAL_COMMUNITY)
Admission: AD | Admit: 2022-03-27 | Discharge: 2022-03-27 | Disposition: A | Discharge status: Home / Self Care | Payer: Medicaid Other | Attending: Obstetrics and Gynecology | Admitting: Obstetrics and Gynecology

## 2022-03-27 DIAGNOSIS — Z3A01 Less than 8 weeks gestation of pregnancy: Secondary | ICD-10-CM

## 2022-03-27 DIAGNOSIS — Z3A00 Weeks of gestation of pregnancy not specified: Secondary | ICD-10-CM | POA: Diagnosis not present

## 2022-03-27 DIAGNOSIS — O10911 Unspecified pre-existing hypertension complicating pregnancy, first trimester: Secondary | ICD-10-CM | POA: Insufficient documentation

## 2022-03-27 DIAGNOSIS — O99891 Other specified diseases and conditions complicating pregnancy: Secondary | ICD-10-CM

## 2022-03-27 DIAGNOSIS — R109 Unspecified abdominal pain: Secondary | ICD-10-CM | POA: Diagnosis not present

## 2022-03-27 DIAGNOSIS — O10919 Unspecified pre-existing hypertension complicating pregnancy, unspecified trimester: Secondary | ICD-10-CM | POA: Diagnosis not present

## 2022-03-27 DIAGNOSIS — R1030 Lower abdominal pain, unspecified: Secondary | ICD-10-CM

## 2022-03-27 DIAGNOSIS — O26891 Other specified pregnancy related conditions, first trimester: Secondary | ICD-10-CM | POA: Insufficient documentation

## 2022-03-27 LAB — WET PREP, GENITAL
Clue Cells Wet Prep HPF POC: NONE SEEN
Sperm: NONE SEEN
Trich, Wet Prep: NONE SEEN
WBC, Wet Prep HPF POC: >=10 — AB (ref <10)
Yeast Wet Prep HPF POC: NONE SEEN

## 2022-03-27 LAB — CBC
HCT: 38.2 % (ref 36.0–46.0)
Hemoglobin: 12.4 g/dL (ref 12.0–15.0)
MCH: 24.8 pg — ABNORMAL LOW (ref 26.0–34.0)
MCHC: 32.5 g/dL (ref 30.0–36.0)
MCV: 76.2 fL — ABNORMAL LOW (ref 80.0–100.0)
Platelets: 350 10*3/uL (ref 150–400)
RBC: 5.01 MIL/uL (ref 3.87–5.11)
RDW: 15.1 % (ref 11.5–15.5)
WBC: 4.2 10*3/uL (ref 4.0–10.5)
nRBC: 0 % (ref 0.0–0.2)

## 2022-03-27 LAB — COMPREHENSIVE METABOLIC PANEL
ALT: 12 U/L (ref 0–44)
AST: 16 U/L (ref 15–41)
Albumin: 3.6 g/dL (ref 3.5–5.0)
Alkaline Phosphatase: 62 U/L (ref 38–126)
Anion gap: 7 (ref 5–15)
BUN: 7 mg/dL (ref 6–20)
CO2: 22 mmol/L (ref 22–32)
Calcium: 9.1 mg/dL (ref 8.9–10.3)
Chloride: 107 mmol/L (ref 98–111)
Creatinine, Ser: 0.83 mg/dL (ref 0.44–1.00)
GFR, Estimated: >60 mL/min (ref >60)
Glucose, Bld: 211 mg/dL — ABNORMAL HIGH (ref 70–99)
Potassium: 4.4 mmol/L (ref 3.5–5.1)
Sodium: 136 mmol/L (ref 135–145)
Total Bilirubin: 0.5 mg/dL (ref 0.3–1.2)
Total Protein: 6.9 g/dL (ref 6.5–8.1)

## 2022-03-27 LAB — URINALYSIS, ROUTINE W REFLEX MICROSCOPIC
Bilirubin Urine: NEGATIVE
Glucose, UA: 50 mg/dL — AB
Hgb urine dipstick: NEGATIVE
Ketones, ur: NEGATIVE mg/dL
Nitrite: NEGATIVE
Protein, ur: NEGATIVE mg/dL
Specific Gravity, Urine: 1.017 (ref 1.005–1.030)
pH: 6 (ref 5.0–8.0)

## 2022-03-27 LAB — POCT PREGNANCY, URINE: Preg Test, Ur: POSITIVE — AB

## 2022-03-27 LAB — HCG, QUANTITATIVE, PREGNANCY: hCG, Beta Chain, Quant, S: 3133 m[IU]/mL — ABNORMAL HIGH (ref <5)

## 2022-03-27 MED ORDER — NIFEDIPINE ER OSMOTIC RELEASE 30 MG PO TB24: 30.0000 mg | ORAL_TABLET | Freq: Every day | ORAL | 3 refills | Status: DC

## 2022-03-27 MED ORDER — ACETAMINOPHEN 500 MG PO TABS
1000.0000 mg | ORAL_TABLET | Freq: Once | ORAL | Status: AC
  Administered 2022-03-27: 1000 mg via ORAL
  Filled 2022-03-27: qty 2

## 2022-03-27 MED ORDER — NIFEDIPINE ER OSMOTIC RELEASE 30 MG PO TB24
30.0000 mg | ORAL_TABLET | Freq: Once | ORAL | Status: AC
Start: 2022-03-27 — End: 2022-03-27
  Administered 2022-03-27: 30 mg via ORAL
  Filled 2022-03-27: qty 1

## 2022-03-27 NOTE — MAU Provider Note (Signed)
?History  ?  ? ?CSN: [id number] ? ?Arrival date and time: 03/27/22 1052 ? ? Event Date/Time  ? First Provider Initiated Contact with Patient 03/27/22 1145   ?  ? ?Chief Complaint  ?Patient presents with  ? Abdominal Pain  ? Possible Pregnancy  ? ?Denise [name] is a 31 y.o. [id number] at [redacted]w[redacted]d by Definite LMP on February 23, 2022.  She presents today for Abdominal Pain.  She reports she started having light cramping on Saturday and took a UPT which was positive.  She reports initially the cramping was "small" but is now stronger. She describes the cramping as initially "like a light menstrual cycle, but now like a heavy one with increased intensity."  Patient reports the pain is constant and has no relieving or worsening factors.  She reports she has not tried any interventions or OTC medications.  Patient rates the pain a 7/10. She endorses diarrhea in the form of loose stools. She denies nausea/vomiting, issues with urination, or constipation.  She denies recent sexual activity or vaginal concerns including bleeding or discharge.  ? ? ?OB History   ? ? Gravida  ?3  ? Para  ?2  ? Term  ?1  ? Preterm  ?1  ? AB  ?   ? Living  ?2  ?  ? ? SAB  ?   ? IAB  ?   ? Ectopic  ?   ? Multiple  ?0  ? Live Births  ?2  ?   ?  ?  ? ? ?Past Medical History:  ?Diagnosis Date  ? Allergy   ? Arthritis   ? Asthma   ? Gestational diabetes   ? Hx of chlamydia infection 2017  ? Pregnancy induced hypertension   ? Pre-E  ? Seasonal allergies   ? Trichomonas 2017  ? ? ?Past Surgical History:  ?Procedure Laterality Date  ? CERVICAL CERCLAGE N/A 11/23/2018  ? Procedure: CERCLAGE CERVICAL;  Surgeon: Donnamae Jude, MD;  Location: [facility];  Service: Gynecology;  Laterality: N/A;  ? ? ?Family History  ?Problem Relation Age of Onset  ? Arthritis Mother   ? Diabetes Mother   ? Diabetes Maternal Grandmother   ? Arthritis Maternal Grandmother   ? ? ?Social History  ? ?Tobacco Use  ? Smoking status: Never  ? Smokeless tobacco: Never  ?Vaping Use  ?  Vaping Use: Never used  ?Substance Use Topics  ? Alcohol use: Not Currently  ? Drug use: No  ? ? ?Allergies:  ?Allergies  ?Allergen Reactions  ? Wilder Glade [Dapagliflozin] Nausea And Vomiting  ? ? ?Medications Prior to Admission  ?Medication Sig Dispense Refill Last Dose  ? Dulaglutide (TRULICITY) [id number] [id number] SOPN Inject 0.75 mg into the skin once a week. 6 mL 3 03/18/2022  ? glimepiride (AMARYL) 4 MG tablet Take 1 tablet (4 mg total) by mouth daily before breakfast. 30 tablet 3 03/25/2022  ? losartan (COZAAR) 50 MG tablet Take 1 tablet (50 mg total) by mouth daily. 90 tablet 3 More than a month  ? metFORMIN (GLUCOPHAGE-XR) 500 MG 24 hr tablet Take 3 tablets (1,500 mg total) by mouth daily. 270 tablet 3 03/25/2022  ? simvastatin (ZOCOR) 20 MG tablet Take 1 tablet (20 mg total) by mouth at bedtime. 90 tablet 3 More than a month  ? ? ?Review of Systems  ?Constitutional:  Negative for chills and fever.  ?Gastrointestinal:  Positive for abdominal pain and diarrhea. Negative for constipation, nausea and vomiting.  ?Genitourinary:  Negative for difficulty urinating, dysuria, vaginal bleeding and vaginal discharge.  ?Neurological:  Negative for dizziness, light-headedness and headaches.  ?Physical Exam  ? ?Blood pressure (!) 144/87, pulse 79, temperature 98.7 ?F (37.1 ?C), temperature source Oral, resp. rate 17, height 5\' 6"  (1.676 m), weight 103.9 kg, last menstrual period 02/23/2022, SpO2 100 %. ?Vitals:  ? 03/27/22 1109 03/27/22 1126 03/27/22 1127 03/27/22 1316  ?BP: (!) 152/88 (!) 144/87 (!) 144/87 (!) 142/82  ?Pulse: 78 79 79 77  ?Resp: 18 17    ?Temp: 98.7 ?F (37.1 ?C)     ?TempSrc: Oral     ?SpO2: 100% 100%    ?Weight: 103.9 kg     ?Height: 5\' 6"  (1.676 m)     ? ? ?Physical Exam ?Vitals reviewed.  ?Constitutional:   ?   Appearance: Normal appearance. She is well-developed.  ?HENT:  ?   Head: Normocephalic and atraumatic.  ?Eyes:  ?   Conjunctiva/sclera: Conjunctivae normal.  ?Cardiovascular:  ?   Rate and Rhythm: Normal  rate.  ?Pulmonary:  ?   Effort: Pulmonary effort is normal. No respiratory distress.  ?Abdominal:  ?   Tenderness: There is abdominal tenderness in the right lower quadrant.  ?Skin: ?   General: Skin is warm and dry.  ?Neurological:  ?   Mental Status: She is alert and oriented to person, place, and time.  ?Psychiatric:     ?   Mood and Affect: Mood normal.     ?   Behavior: Behavior normal.  ? ? ?MAU Course  ?Procedures ?Results for orders placed or performed during the hospital encounter of 03/27/22 (from the past 24 hour(s))  ?Pregnancy, urine POC     Status: Abnormal  ? Collection Time: 03/27/22 11:01 AM  ?Result Value Ref Range  ? Preg Test, Ur POSITIVE (A) NEGATIVE  ?Urinalysis, Routine w reflex microscopic Urine, Clean Catch     Status: Abnormal  ? Collection Time: 03/27/22 11:44 AM  ?Result Value Ref Range  ? Color, Urine YELLOW YELLOW  ? APPearance CLOUDY (A) CLEAR  ? Specific Gravity, Urine 1.017 1.005 - 1.030  ? pH 6.0 5.0 - 8.0  ? Glucose, UA 50 (A) NEGATIVE mg/dL  ? Hgb urine dipstick NEGATIVE NEGATIVE  ? Bilirubin Urine NEGATIVE NEGATIVE  ? Ketones, ur NEGATIVE NEGATIVE mg/dL  ? Protein, ur NEGATIVE NEGATIVE mg/dL  ? Nitrite NEGATIVE NEGATIVE  ? Leukocytes,Ua MODERATE (A) NEGATIVE  ? RBC / HPF 0-5 0 - 5 RBC/hpf  ? WBC, UA 6-10 0 - 5 WBC/hpf  ? Bacteria, UA RARE (A) NONE SEEN  ? Squamous Epithelial / LPF 11-20 0 - 5  ? Mucus PRESENT   ?hCG, quantitative, pregnancy     Status: Abnormal  ? Collection Time: 03/27/22 11:48 AM  ?Result Value Ref Range  ? hCG, Beta Chain, Quant, S 3,133 (H) <5 mIU/mL  ?CBC     Status: Abnormal  ? Collection Time: 03/27/22 11:48 AM  ?Result Value Ref Range  ? WBC 4.2 4.0 - 10.5 K/uL  ? RBC 5.01 3.87 - 5.11 MIL/uL  ? Hemoglobin 12.4 12.0 - 15.0 g/dL  ? HCT 38.2 36.0 - 46.0 %  ? MCV 76.2 (L) 80.0 - 100.0 fL  ? MCH 24.8 (L) 26.0 - 34.0 pg  ? MCHC 32.5 30.0 - 36.0 g/dL  ? RDW 15.1 11.5 - 15.5 %  ? Platelets 350 150 - 400 K/uL  ? nRBC 0.0 0.0 - 0.2 %  ?Comprehensive metabolic panel  Status: Abnormal  ? Collection Time: 03/27/22 11:48 AM  ?Result Value Ref Range  ? Sodium 136 135 - 145 mmol/L  ? Potassium 4.4 3.5 - 5.1 mmol/L  ? Chloride 107 98 - 111 mmol/L  ? CO2 22 22 - 32 mmol/L  ? Glucose, Bld 211 (H) 70 - 99 mg/dL  ? BUN 7 6 - 20 mg/dL  ? Creatinine, Ser 0.83 0.44 - 1.00 mg/dL  ? Calcium 9.1 8.9 - 10.3 mg/dL  ? Total Protein 6.9 6.5 - 8.1 g/dL  ? Albumin 3.6 3.5 - 5.0 g/dL  ? AST 16 15 - 41 U/L  ? ALT 12 0 - 44 U/L  ? Alkaline Phosphatase 62 38 - 126 U/L  ? Total Bilirubin 0.5 0.3 - 1.2 mg/dL  ? GFR, Estimated >60 >60 mL/min  ? Anion gap 7 5 - 15  ?Wet prep, genital     Status: Abnormal  ? Collection Time: 03/27/22 11:59 AM  ? Specimen: PATH Cytology Cervicovaginal Ancillary Only  ?Result Value Ref Range  ? Yeast Wet Prep HPF POC NONE SEEN NONE SEEN  ? Trich, Wet Prep NONE SEEN NONE SEEN  ? Clue Cells Wet Prep HPF POC NONE SEEN NONE SEEN  ? WBC, Wet Prep HPF POC >=10 (A) <10  ? Sperm NONE SEEN   ? ?US OB LESS THAN 14 WEEKS WITH OB TRANSVAGINAL ? ?Result Date: 03/27/2022 ?CLINICAL DATA:  Abdominal pain. EXAM: OBSTETRIC <14 WK [place] AND TRANSVAGINAL OB US TECHNIQUE: Both transabdominal and transvaginal ultrasound examinations were performed for complete evaluation of the gestation as well as the maternal uterus, adnexal regions, and pelvic cul-de-sac. Transvaginal technique was performed to assess early pregnancy. COMPARISON:  None Available. FINDINGS: Intrauterine gestational sac: Single Yolk sac:  Not Visualized. Embryo:  Not Visualized. Cardiac Activity: Not Visualized. MSD: 5.0 mm   5 w   2 d Subchorionic hemorrhage:  None visualized. Maternal uterus/adnexae: Ovaries unremarkable. Small amount of free fluid is noted which most likely is physiologic. IMPRESSION: Probable early intrauterine gestational sac, but no yolk sac, fetal pole, or cardiac activity yet visualized. Recommend follow-up quantitative B-HCG levels and follow-up US in 14 days to assess viability. This recommendation  follows SRU consensus guidelines: Diagnostic Criteria for Nonviable Pregnancy Early in the First Trimester. [place] Med [id number]. Electronically Signed   By: Marijo Conception M.D.   On: 03/27/2022 13:

## 2022-03-27 NOTE — MAU Note (Addendum)
Denise [name] is a 31 y.o.  here in MAU reporting: been having some cramps,  a little stronger today. +HPT 5/6. No bleeding. ?LMP: 4/8 ?Onset of complaint: few days ago ?Pain score: 8 ?Vitals:  ? 03/27/22 1109  ?BP: (!) 152/88  ?Pulse: 78  ?Resp: 18  ?Temp: 98.7 ?F (37.1 ?C)  ?SpO2: 100%  ?   ?Lab orders placed from triage:  UA ? ?Asked if she is on BP medication, said she doesn't know ? ? ?

## 2022-03-28 ENCOUNTER — Ambulatory Visit (INDEPENDENT_AMBULATORY_CARE_PROVIDER_SITE_OTHER): Payer: Medicaid Other | Admitting: Internal Medicine

## 2022-03-28 ENCOUNTER — Encounter: Payer: Self-pay | Admitting: Internal Medicine

## 2022-03-28 VITALS — BP 130/82 | HR 82 | Ht 66.0 in | Wt 230.2 lb

## 2022-03-28 DIAGNOSIS — E785 Hyperlipidemia, unspecified: Secondary | ICD-10-CM

## 2022-03-28 DIAGNOSIS — F40298 Other specified phobia: Secondary | ICD-10-CM

## 2022-03-28 DIAGNOSIS — O24311 Unspecified pre-existing diabetes mellitus in pregnancy, first trimester: Secondary | ICD-10-CM | POA: Diagnosis not present

## 2022-03-28 LAB — POCT GLYCOSYLATED HEMOGLOBIN (HGB A1C): Hemoglobin A1C: 8.5 % — AB (ref 4.0–5.6)

## 2022-03-28 LAB — GC/CHLAMYDIA PROBE AMP (~~LOC~~) NOT AT ARMC
Chlamydia: NEGATIVE
Comment: NEGATIVE
Comment: NORMAL
Neisseria Gonorrhea: NEGATIVE

## 2022-03-28 MED ORDER — LANTUS SOLOSTAR 100 UNIT/ML ~~LOC~~ SOPN: 12.0000 [IU] | PEN_INJECTOR | Freq: Every day | SUBCUTANEOUS | 3 refills | Status: DC

## 2022-03-28 MED ORDER — INSULIN PEN NEEDLE 32G X 4 MM MISC
3 refills | Status: DC
Start: 2022-03-28 — End: 2023-01-29

## 2022-03-28 MED ORDER — GLIPIZIDE 5 MG PO TABS: 5.0000 mg | ORAL_TABLET | Freq: Two times a day (BID) | ORAL | 3 refills | Status: DC

## 2022-03-28 NOTE — Patient Instructions (Addendum)
Please continue: ?- Metformin ER 500 mg 3x a day ? ?Please change from Glimepiride: ?- Glipizide 5 mg before b'fast and dinner ? ?Start: ?- Lantus 12 units at bedtime - increase by 3 units every 2 days until sugars in am are at goal: ?Before meals <95, ?1h after a meal <140 ?2h after a meal <120 ? ?Stop Trulicity. ? ?Regarding the insulin injections: ?Avoid watching the needle stick or looking at the needle itself. ?Distract yourself by wiggling your toes, looking intently at a pattern on the wall or chatting with someone during the needle stick. ?Lie down before getting a needle stick, especially if you have fainted or felt dizzy in the past. ?Practice deep-breathing techniques, such as counting to four on each inhale and exhale. ?Relax the muscle where you're receiving an injection to decrease pain. ?Request a numbing agent such as a freezing spray to numb your skin before the needle prick. ? ?Please schedule an appt with Oran Rein with nutrition. ? ?Please return in 1 months with your sugar log.  ? ?

## 2022-03-28 NOTE — Progress Notes (Addendum)
Patient ID: Denise [name], female   DOB: 14-Jun-1991, 31 y.o.   MRN: [id number] ? ?This visit occurred during the SARS-CoV-2 public health emergency.  Safety protocols were in place, including screening questions prior to the visit, additional usage of staff PPE, and extensive cleaning of exam room while observing appropriate contact time as indicated for disinfecting solutions.  ? ?HPI: ?Denise Burkitticole E Benedick is a 31 y.o.-year-old female, returning for follow-up for DM2, dx in 2020 as GDM first, insulin-dependent but not on insulin, uncontrolled, with complications (DR, PN).  She previously saw Dr. Everardo AllEllison.  Last visit 3 months ago. ?Her mother is my pt:  Rowe ClackYolanda Nebergall. ? ?She is currently pregnant, almost [redacted] weeks along.  She started to see high risk OB.  She is not nauseated, but has increased urination. ? ?She describes that she stopped regular drinking sodas several months ago. ? ?Reviewed HbA1c: ?Lab Results  ?Component Value Date  ? HGBA1C 14.6 (A) 10/26/2021  ? HGBA1C 11.3 (A) 02/20/2021  ? HGBA1C 8.4 (A) 11/24/2020  ? HGBA1C 9.2 (A) 08/23/2020  ? HGBA1C 14.0 (A) 06/23/2020  ? HGBA1C 14.5 (H) 06/01/2020  ? ?Pt was previously on: ?-Metformin  ER 500 mg daily ?-Glimepiride 4 mg before b'fast ?-Trulicity 0.75 mg weekly-last dose yesterday ?She previously was [place] but she did not tolerate it due to nausea and vomiting. ? ?She has needle phobia. ? ?Pt  is not checking blood sugars: ?- am: n/c ?- 2h after b'fast: n/c ?- before lunch: n/c ?- 2h after lunch: n/c ?- before dinner: n/c ?- 2h after dinner: n/c ?- bedtime: n/c ?- nighttime: n/c ?Lowest sugar was 170. ?Highest sugar was HI in 2022 (asthma attack) >> 750. ? ?Glucometer: Accu-Chek guide ? ?- no CKD, last BUN/creatinine:  ?Lab Results  ?Component Value Date  ? BUN 7 03/27/2022  ? BUN 11 10/26/2021  ? CREATININE 0.83 03/27/2022  ? CREATININE 0.82 10/26/2021  ?Previously on losartan 50 mg daily.  Now off. ? ?- last set of lipids: ?Lab Results  ?Component Value  Date  ? CHOL 157 10/26/2021  ? HDL 81.60 10/26/2021  ? LDLCALC 55 10/26/2021  ? TRIG 102.0 10/26/2021  ? CHOLHDL 2 10/26/2021  ?Previously on Zocor 20 mg daily.  Now off. ? ?- last eye exam was in 2022. No DR.  ? ?- no numbness and tingling in her feet. ? ?Pt has FH of DM in mother. ? ?Of note, she has a history of preterm delivery and preeclampsia with a previous pregnancy.  She is status post cervical cerclage. ? ?ROS: ?+ see HPI ?No increased urination, blurry vision, nausea, chest pain. ? ?Past Medical History:  ?Diagnosis Date  ? Allergy   ? Arthritis   ? Asthma   ? Gestational diabetes   ? Hx of chlamydia infection 2017  ? Pregnancy induced hypertension   ? Pre-E  ? Seasonal allergies   ? Trichomonas 2017  ? ?Past Surgical History:  ?Procedure Laterality Date  ? CERVICAL CERCLAGE N/A 11/23/2018  ? Procedure: CERCLAGE CERVICAL;  Surgeon: Reva BoresPratt, Tanya S, MD;  [facility];  Service: Gynecology;  Laterality: N/A;  ? ?Social History  ? ?Socioeconomic History  ? Marital status: Single  ?  Spouse name: Not on file  ? Number of children: 1  ? Years of education: Not on file  ? Highest education level: Not on file  ?Occupational History  ? Occupation: Childcare  ?Tobacco Use  ? Smoking status: Never  ? Smokeless tobacco: Never  ?  Vaping Use  ? Vaping Use: Never used  ?Substance and Sexual Activity  ? Alcohol use: Not Currently  ? Drug use: No  ? Sexual activity: Yes  ?  Partners: Male  ?  Birth control/protection: None  ?  Comment: with monagamous partner   ?Other Topics Concern  ? Not on file  ?Social History Narrative  ? Pt is from [place]. She lives in [place] with her mother and child.   ? ?Social Determinants of Health  ? ?Financial Resource Strain: Not on file  ?Food Insecurity: Not on file  ?Transportation Needs: Not on file  ?Physical Activity: Not on file  ?Stress: Not on file  ?Social Connections: Not on file  ?Intimate Partner Violence: Not on file  ? ?Current Outpatient Medications  on File Prior to Visit  ?Medication Sig Dispense Refill  ? Dulaglutide (TRULICITY) 0.75 MG/0.5ML SOPN Inject 0.75 mg into the skin once a week. 6 mL 3  ? glimepiride (AMARYL) 4 MG tablet Take 1 tablet (4 mg total) by mouth daily before breakfast. 30 tablet 3  ? metFORMIN (GLUCOPHAGE-XR) 500 MG 24 hr tablet Take 3 tablets (1,500 mg total) by mouth daily. 270 tablet 3  ? NIFEdipine (PROCARDIA XL) 30 MG 24 hr tablet Take 1 tablet (30 mg total) by mouth daily. 30 tablet 3  ? ?No current facility-administered medications on file prior to visit.  ? ?Allergies  ?Allergen Reactions  ? Marcelline Deist [Dapagliflozin] Nausea And Vomiting  ? ?Family History  ?Problem Relation Age of Onset  ? Arthritis Mother   ? Diabetes Mother   ? Diabetes Maternal Grandmother   ? Arthritis Maternal Grandmother   ? ?PE: ?LMP 02/23/2022  ?Wt Readings from Last 3 Encounters:  ?12/31/21 217 lb 9.6 oz (98.7 kg)  ?11/26/21 223 lb 6.4 oz (101.3 kg)  ?10/26/21 220 lb (99.8 kg)  ? ?Constitutional: overweight, in NAD ?Eyes: PERRLA, EOMI, no exophthalmos ?ENT: moist mucous membranes, no thyromegaly, no cervical lymphadenopathy ?Cardiovascular: RRR, No MRG ?Respiratory: CTA B ?Musculoskeletal: no deformities, strength intact in all 4 ?Skin: moist, warm, no rashes ?Neurological: no tremor with outstretched hands, DTR normal in all 4 ? ?ASSESSMENT: ?1. DM2, insulin-dependent but not on insulin due to fear of needles, uncontrolled, with complications ?- DR ?- PN ? ?2. Needle phobia ? ?3. HL ? ?PLAN:  ?1. Patient with long-standing, uncontrolled diabetes, on oral antidiabetic regimen with metformin and sulfonylurea and also weekly GLP-1 receptor agonist at low-dose, with very poor control-latest HbA1c from 10/2021 was 14.6%, now pregnant in the first trimester.  At today's visit, however, a new HbA1c is much better: ?Lab Results  ?Component Value Date  ? HGBA1C 8.5 (A) 03/28/2022  ?-This is significant decrease in HbA1c, but we did discuss that target for pregnancy  is <6.5% at the beginning of the pregnancy and <6% afterwards, if no significant risk of hypoglycemia ?- I recommended to see high risk OB >> she already started ?- At this visit, we discussed about the absolute need to start insulin to prevent pregnancy complications.  Her needle phobia is a barrier, however, she is now amenable to start insulin, after she saw her mother injecting insulin.  We discussed that we can use the smallest needles and will start with only 1 injection a day.  I advised her how to titrate the dose up as needed.  Discussed to stay in touch with me if the sugars are not improving. ?- I demonstrated correct injection techniques and also how to use the  insulin pen.  Patient voices understanding. ?- I also continued her metformin and switched from glimepiride to glipizide.  I advised her to take this before breakfast and dinner.  We will stop Trulicity. ?- She is interested in dietary advised so I referred her to nutrition today ?- I suggested to:  ?Patient Instructions  ?Please continue: ?- Metformin ER 500 mg 3x a day ? ?Please change from Glimepiride: ?- Glipizide 5 mg before b'fast and dinner ? ?Start: ?- Lantus 12 units at bedtime - increase by 3 units every 2 days until sugars in am are at goal: ?Before meals <95, ?1h after a meal <140 ?2h after a meal <120 ? ?Stop Trulicity. ? ?Regarding the insulin injections: ?Avoid watching the needle stick or looking at the needle itself. ?Distract yourself by wiggling your toes, looking intently at a pattern on the wall or chatting with someone during the needle stick. ?Lie down before getting a needle stick, especially if you have fainted or felt dizzy in the past. ?Practice deep-breathing techniques, such as counting to four on each inhale and exhale. ?Relax the muscle where you're receiving an injection to decrease pain. ?Request a numbing agent such as a freezing spray to numb your skin before the needle prick. ? ?Please schedule an appt with  Oran Rein with nutrition. ? ?Please return in 1 months with your sugar log.  ? ?- Strongly advised her to start checking sugars at different times of the day - check 7x a day, rotating checks ?- discussed

## 2022-03-29 ENCOUNTER — Other Ambulatory Visit (HOSPITAL_COMMUNITY): Payer: Medicaid Other | Attending: Obstetrics and Gynecology

## 2022-04-08 ENCOUNTER — Telehealth: Payer: Self-pay

## 2022-04-08 ENCOUNTER — Encounter: Payer: Medicaid Other | Attending: Registered Nurse | Admitting: Registered"

## 2022-04-08 ENCOUNTER — Ambulatory Visit (INDEPENDENT_AMBULATORY_CARE_PROVIDER_SITE_OTHER): Payer: Medicaid Other | Admitting: Registered"

## 2022-04-08 DIAGNOSIS — O24311 Unspecified pre-existing diabetes mellitus in pregnancy, first trimester: Secondary | ICD-10-CM | POA: Insufficient documentation

## 2022-04-08 DIAGNOSIS — Z713 Dietary counseling and surveillance: Secondary | ICD-10-CM | POA: Insufficient documentation

## 2022-04-08 DIAGNOSIS — O24111 Pre-existing diabetes mellitus, type 2, in pregnancy, first trimester: Secondary | ICD-10-CM | POA: Insufficient documentation

## 2022-04-08 NOTE — Progress Notes (Signed)
Patient was seen for T2D in pregnancy self-management on 04/08/2022  Start time 0815 and End time 0915   Estimated due date: 11/30/22; [redacted]w[redacted]d  Clinical: Medications:   glipiZide 5 mg;  Lantus 12-18u qhs;  Metformin 1500 mg with lunch Medical History: GDM 2019; T2D 2021, pre-term delivery & preeclampsia Labs: OGTT n/a, A1c 8.5%  (was 14.6% on 10/26/21)  Dietary and Lifestyle History: Patient T2D has been managed by [facility]. Patient states she was well managed on metformin and Trulicity and dietary changes. Pt states she stopped taking medications and was drinking sweetened beverages again and insulin was needed to control again.  Pt reports her current Ob is in [facility] and planning to keep her upcoming appointment and has also scheduled new ob visits at [facility] high risk clinic, [facility] [employer]   Pt states she has needle phobia and the idea of using the insulin pump helps to reduce her anxiety, but has watched others use CGM and prefers to continue pricking her finger for SMBG  Pt states since Dr. Elvera Lennox started her on insulin again she feels like her blood sugar is dropping and therefore has not increased the insulin dose has instructed. Pt reports when she has sxs of shaky and sweaty, does not feel safe driving. Pt reports treating hypoglycemic sxs (at 78 mg/dL) by eating sweet things such as cheese cake (reviewed rule of 15 with Pt).  Pt states she has a pretty scheduled sleep and meal routine but afraid she is not eating enough. Pt is likely not eating enough protein. Pt reports trying to make healthy choices and is motivated to manage.  Physical Activity: daily walking Stress: not assessed Sleep: not assessed  24 hr Recall:  ~6:30 starts morning with coffee, 1 T creamer and 1 T honey First Meal: 8:00 [place] breakfast biscuits Snack: pretzels or trail mix Second meal: ramen noodles OR Jamaican beef ("quick foods")  Snack:   Third meal: (around 6-8pm) tacos OR hamburger, 5 fries honey mustard) Snack:none Beverages: water, coffee, not much soda, occasional apple juice  NUTRITION INTERVENTION  Nutrition education (E-1) on the following topics:   Initial Follow-up  []  []  Definition of Gestational Diabetes []  []  Why dietary management is important in controlling blood glucose [x]  []  Effects each nutrient has on blood glucose levels []  []  Simple carbohydrates vs complex carbohydrates []  []  Fluid intake [x]  []  Creating a balanced meal plan [x]  []  Carbohydrate counting  [x]  []  When to check blood glucose levels []  []  Proper blood glucose monitoring techniques [x]  []  Effect of stress and stress reduction techniques  [x]  []  Exercise effect on blood glucose levels, appropriate exercise during pregnancy [x]  []  Importance of limiting caffeine and abstaining from alcohol and smoking [x]  []  Medications used for blood sugar control during pregnancy [x]  []  Hypoglycemia and rule of 15 []  []  Postpartum self care  Patient already has a meter, has tested a few FBS and 2 hours after meals. Recent numbers are more in line that when first started checking FBS: 120 mg/dL Postprandial: , 85 mg/dL  Patient instructed to monitor glucose levels: FBS: 70 - ? 95 mg/dL  1 hour: ? mg/dL 2 hour: ? mg/dL  Patient received handouts: Pre-existing Diabetes in Pregnancy Carbohydrate Counting List  Patient will be seen for follow-up as needed.

## 2022-04-08 NOTE — Telephone Encounter (Signed)
Contacted pt and advised Dr Elvera Lennox wants her to d/c glipizide and continue to monitor blood sugars. Pt advised she has been increasing her Lantus and currently at 16 units her readings have been in the 90's and low 100's. Pt tried increasing to 18 units and blood sugar dropped low to 78 and she was nauseous and jittery. Pt wants to stay at 16 units daily. Per pt Lantus and oral medication combined have significantly lowered her blood sugars.

## 2022-04-16 DIAGNOSIS — Z113 Encounter for screening for infections with a predominantly sexual mode of transmission: Secondary | ICD-10-CM | POA: Diagnosis not present

## 2022-04-16 DIAGNOSIS — Z3201 Encounter for pregnancy test, result positive: Secondary | ICD-10-CM | POA: Diagnosis not present

## 2022-04-16 DIAGNOSIS — M545 Low back pain, unspecified: Secondary | ICD-10-CM | POA: Diagnosis not present

## 2022-04-16 DIAGNOSIS — O200 Threatened abortion: Secondary | ICD-10-CM | POA: Diagnosis not present

## 2022-04-16 DIAGNOSIS — Z8619 Personal history of other infectious and parasitic diseases: Secondary | ICD-10-CM | POA: Diagnosis not present

## 2022-04-17 DIAGNOSIS — R7989 Other specified abnormal findings of blood chemistry: Secondary | ICD-10-CM | POA: Insufficient documentation

## 2022-04-18 DIAGNOSIS — R7989 Other specified abnormal findings of blood chemistry: Secondary | ICD-10-CM | POA: Diagnosis not present

## 2022-04-19 ENCOUNTER — Encounter: Payer: Self-pay | Admitting: Internal Medicine

## 2022-04-19 ENCOUNTER — Ambulatory Visit (INDEPENDENT_AMBULATORY_CARE_PROVIDER_SITE_OTHER): Payer: Medicaid Other | Admitting: Internal Medicine

## 2022-04-19 VITALS — BP 120/76 | HR 68 | Ht 66.0 in | Wt 235.2 lb

## 2022-04-19 DIAGNOSIS — O24311 Unspecified pre-existing diabetes mellitus in pregnancy, first trimester: Secondary | ICD-10-CM | POA: Diagnosis not present

## 2022-04-19 DIAGNOSIS — E785 Hyperlipidemia, unspecified: Secondary | ICD-10-CM | POA: Diagnosis not present

## 2022-04-19 NOTE — Progress Notes (Signed)
Patient ID: [name], female   DOB: Feb 13, 1991, 31 y.o.   MRN: [id number]  HPI: Denise [name] is a 31 y.o.-year-old female, returning for follow-up for DM2, dx in 2020 as GDM first, insulin-dependent but not on insulin, uncontrolled, with complications (DR, PN).  She previously saw Dr. Everardo All.  Last visit 1 month ago. Her mother is my pt:  Denise [name].  Interim history: She is currently pregnant, [redacted] weeks along.  She feels well, without nausea, increased acid reflux, leg swelling.  Reviewed HbA1c: Lab Results  Component Value Date   HGBA1C 8.5 (A) 03/28/2022   HGBA1C 14.6 (A) 10/26/2021   HGBA1C 11.3 (A) 02/20/2021   HGBA1C 8.4 (A) 11/24/2020   HGBA1C 9.2 (A) 08/23/2020   HGBA1C 14.0 (A) 06/23/2020   HGBA1C 14.5 (H) 06/01/2020   At last visit she was on: -Metformin  ER 500 mg daily -Glimepiride 4 mg before b'fast -Trulicity 0.75 mg weekly-last dose yesterday She previously was [place] but she did not tolerate it due to nausea and vomiting.  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  Pt was not checking blood sugars at last visit, now checking 1-5X a day: - am: 92-155 - 2h after b'fast:  n/c - before lunch: 79-208 - 2h after lunch: 84-179 - before dinner: 192 - 2h after dinner: 120 - bedtime: 172-163 - nighttime: n/c Lowest sugar was 170 >> 90. Highest sugar was HI in 2022 (asthma attack) >> 750 >> 208 (no insulin).  Glucometer: Accu-Chek guide  - no CKD, last BUN/creatinine:  Lab Results  Component Value Date   BUN 7 03/27/2022   BUN 11 10/26/2021   CREATININE 0.83 03/27/2022   CREATININE 0.82 10/26/2021  Previously on losartan 50 mg daily.  Now off.  - last set of lipids: Lab Results  Component Value Date   CHOL 157 10/26/2021   HDL 81.60 10/26/2021   LDLCALC 55 10/26/2021   TRIG 102.0 10/26/2021   CHOLHDL 2 10/26/2021  Previously on  Zocor 20 mg daily.  Now off.  - last eye exam was in 2022. No DR.   - no numbness and tingling in her feet.  Pt has FH of DM in mother.  Of note, she has a history of preterm delivery and preeclampsia with a previous pregnancy.  She is status post cervical cerclage.  She was previously drinking regular sodas, but she stopped earlier this year.  ROS: + see HPI No increased urination, blurry vision, nausea, chest pain.  Past Medical History:  Diagnosis Date   Allergy    Arthritis    Asthma    Gestational diabetes    Hx of chlamydia infection 2017   Pregnancy induced hypertension    Pre-E   Seasonal allergies    Trichomonas 2017   Past Surgical History:  Procedure Laterality Date   CERVICAL CERCLAGE N/A 11/23/2018   Procedure: CERCLAGE CERVICAL;  Surgeon: Reva Bores, MD;  Location: [facility];  Service: Gynecology;  Laterality: N/A;   Social History   Socioeconomic History   Marital status: Single    Spouse name: Not on file   Number of children: 1   Years of education: Not on file   Highest education level: Not on file  Occupational History   Occupation: Childcare  Tobacco Use   Smoking status: Never   Smokeless tobacco:  Never  Vaping Use   Vaping Use: Never used  Substance and Sexual Activity   Alcohol use: Not Currently   Drug use: No   Sexual activity: Yes    Partners: Male    Birth control/protection: None    Comment: with monagamous partner   Other Topics Concern   Not on file  Social History Narrative   Pt is from [place]. She lives in [place] with her mother and child.    Social Determinants of Health   Financial Resource Strain: Not on file  Food Insecurity: Not on file  Transportation Needs: Not on file  Physical Activity: Not on file  Stress: Not on file  Social Connections: Not on file  Intimate Partner Violence: Not on file   Current Outpatient Medications on File Prior to Visit  Medication Sig Dispense Refill    glipiZIDE (GLUCOTROL) 5 MG tablet Take 1 tablet (5 mg total) by mouth 2 (two) times daily before a meal. 180 tablet 3   insulin glargine (LANTUS SOLOSTAR) 100 UNIT/ML Solostar Pen Inject 12-18 Units into the skin at bedtime. 30 mL 3   Insulin Pen Needle 32G X 4 MM MISC Use 1x a day 100 each 3   metFORMIN (GLUCOPHAGE-XR) 500 MG 24 hr tablet Take 3 tablets (1,500 mg total) by mouth daily. 270 tablet 3   NIFEdipine (PROCARDIA XL) 30 MG 24 hr tablet Take 1 tablet (30 mg total) by mouth daily. 30 tablet 3   No current facility-administered medications on file prior to visit.   Allergies  Allergen Reactions   Farxiga [Dapagliflozin] Nausea And Vomiting   Family History  Problem Relation Age of Onset   Arthritis Mother    Diabetes Mother    Diabetes Maternal Grandmother    Arthritis Maternal Grandmother    PE: BP 120/76 (BP Location: Right Arm, Patient Position: Sitting, Cuff Size: Normal)   Pulse 68   Ht 5\' 6"  (1.676 m)   Wt 235 lb 3.2 oz (106.7 kg)   LMP 02/23/2022   SpO2 99%   BMI 37.96 kg/m  Wt Readings from Last 3 Encounters:  04/19/22 235 lb 3.2 oz (106.7 kg)  03/28/22 230 lb 3.2 oz (104.4 kg)  12/31/21 217 lb 9.6 oz (98.7 kg)   Constitutional: overweight, in NAD Eyes: PERRLA, EOMI, no exophthalmos ENT: moist mucous membranes, no thyromegaly, no cervical lymphadenopathy Cardiovascular: RRR, No MRG Respiratory: CTA B Musculoskeletal: no deformities Skin: moist, warm, no rashes Neurological: no tremor with outstretched hands Diabetic Foot Exam - Simple   Simple Foot Form Diabetic Foot exam was performed with the following findings: Yes 04/19/2022  8:48 AM  Visual Inspection No deformities, no ulcerations, no other skin breakdown bilaterally: Yes Sensation Testing Intact to touch and monofilament testing bilaterally: Yes Pulse Check Posterior Tibialis and Dorsalis pulse intact bilaterally: Yes Comments      ASSESSMENT: 1. DM2, insulin-dependent, uncontrolled, with  complications, now pregnant in the first trimester - DR - PN  2. HL  PLAN:  1. Patient with longstanding, uncontrolled, type 2 diabetes, with her diabetes control previously worsened by her needle phobia.  She is currently pregnant in the first trimester.  She was previously on metformin, sulfonylurea, and GLP-1 receptor agonist.  At last visit, we started long-acting insulin and continued metformin and sulfonylurea, however, her blood sugars drop too low so she stopped glipizide. -At last visit, HbA1c was drastically improved, to 8.5%, from 14.6%, but still above target.  Fortunately, she stopped sweet drinks few months  prior to our last visit.  She continues off sodas. -She sees high risk OB.  At last visit I also referred her to nutrition. -At today's visit, she mentions that she tried to increase the Lantus to 16 and 18 units, but this caused low blood sugars and she stopped.  Reviewing the blood sugars in the last 2 weeks, they are mostly above target, with only few values at goal.  I feel that she would benefit from increasing the Lantus further, initiated 16 units and then to 18 units daily.  We will continue metformin for now.  She will most likely need mealtime insulin, but due to her needle phobia, would prefer to avoid this for now.  She did have some sugars in the 200s when she forgot to take insulin.  Otherwise, they are lower than 200s. -She inquires about an insulin pump (but she is very reticent about this) and we discussed that we do not usually start this during pregnancy, due to the significant changes in her metabolism during the pregnancy.  She is relieved about this. - I suggested to:  Patient Instructions  Please continue: - Metformin ER 500 mg 3x a day  Please increase: - Lantus 16-18 units at bedtime   Pregnancy targets for blood sugars: Before meals <95, 1h after a meal <140 2h after a meal <120  Please return in 1.5 months with your sugar log.   - advised to  check sugars at different times of the day - 7x a day, rotating check times - advised for yearly eye exams >> she is UTD - return to clinic in 1.5 months  2. HL -Reviewed latest lipid panel from 10/2021: All fractions at goal: Lab Results  Component Value Date   CHOL 157 10/26/2021   HDL 81.60 10/26/2021   LDLCALC 55 10/26/2021   TRIG 102.0 10/26/2021   CHOLHDL 2 10/26/2021  -Previously on Zocor 20 mg daily now off.  We will keep off during the pregnancy.  Plan to recheck her lipid panel after she gives birth.  Carlus Pavlov, MD PhD [facility]

## 2022-04-19 NOTE — Patient Instructions (Addendum)
Please continue: - Metformin ER 500 mg 3x a day  Please increase: - Lantus 16-18 units at bedtime   Pregnancy targets for blood sugars: Before meals <95, 1h after a meal <140 2h after a meal <120  Please return in 1.5 months with your sugar log.

## 2022-04-22 DIAGNOSIS — R7989 Other specified abnormal findings of blood chemistry: Secondary | ICD-10-CM | POA: Diagnosis not present

## 2022-04-23 DIAGNOSIS — Z3A08 8 weeks gestation of pregnancy: Secondary | ICD-10-CM | POA: Diagnosis not present

## 2022-04-23 DIAGNOSIS — Z3689 Encounter for other specified antenatal screening: Secondary | ICD-10-CM | POA: Diagnosis not present

## 2022-04-23 DIAGNOSIS — O021 Missed abortion: Secondary | ICD-10-CM | POA: Diagnosis not present

## 2022-05-06 ENCOUNTER — Other Ambulatory Visit: Payer: Medicaid Other

## 2022-05-06 ENCOUNTER — Telehealth: Payer: Self-pay

## 2022-05-06 ENCOUNTER — Other Ambulatory Visit: Payer: Self-pay | Admitting: Endocrinology

## 2022-05-06 DIAGNOSIS — E11319 Type 2 diabetes mellitus with unspecified diabetic retinopathy without macular edema: Secondary | ICD-10-CM

## 2022-05-06 NOTE — Telephone Encounter (Signed)
Pt had a referral placed to see the diabetic nutritionist for Preexisting diabetes complicating pregnancy in first trimester, antepartum, Pt has since miscarried and has an appt today and they need another referral placed to see her.

## 2022-05-09 DIAGNOSIS — O021 Missed abortion: Secondary | ICD-10-CM | POA: Diagnosis not present

## 2022-05-13 DIAGNOSIS — O021 Missed abortion: Secondary | ICD-10-CM | POA: Diagnosis not present

## 2022-05-13 DIAGNOSIS — E785 Hyperlipidemia, unspecified: Secondary | ICD-10-CM | POA: Diagnosis not present

## 2022-05-13 DIAGNOSIS — J45909 Unspecified asthma, uncomplicated: Secondary | ICD-10-CM | POA: Diagnosis not present

## 2022-05-13 DIAGNOSIS — I10 Essential (primary) hypertension: Secondary | ICD-10-CM | POA: Diagnosis not present

## 2022-05-13 DIAGNOSIS — E119 Type 2 diabetes mellitus without complications: Secondary | ICD-10-CM | POA: Diagnosis not present

## 2022-05-13 HISTORY — PX: DILATION AND CURETTAGE OF UTERUS: SHX78

## 2022-05-14 ENCOUNTER — Telehealth: Payer: Medicaid Other

## 2022-05-14 DIAGNOSIS — J45909 Unspecified asthma, uncomplicated: Secondary | ICD-10-CM | POA: Diagnosis not present

## 2022-05-14 DIAGNOSIS — O0289 Other abnormal products of conception: Secondary | ICD-10-CM | POA: Diagnosis not present

## 2022-05-14 DIAGNOSIS — E119 Type 2 diabetes mellitus without complications: Secondary | ICD-10-CM | POA: Diagnosis not present

## 2022-05-14 DIAGNOSIS — E785 Hyperlipidemia, unspecified: Secondary | ICD-10-CM | POA: Diagnosis not present

## 2022-05-23 ENCOUNTER — Encounter: Payer: Medicaid Other | Admitting: Family Medicine

## 2022-05-31 NOTE — Progress Notes (Deleted)
Patient ID: Denise [name], female   DOB: [birth year], 31 y.o.   MRN: [id number]  HPI: Denise [name] is a 31 y.o.-year-old female, returning for follow-up for DM2, dx in 2020 as GDM first, insulin-dependent but not on insulin, uncontrolled, with complications (DR, PN).  She previously saw Dr. Everardo All.  Last visit 1.5 months ago. Her mother is my pt:  [name].  Interim history: She is currently pregnant, [redacted] weeks along.   She feels well, without nausea, increased acid reflux, leg swelling.  Reviewed HbA1c: Lab Results  Component Value Date   HGBA1C 8.5 (A) 03/28/2022   HGBA1C 14.6 (A) 10/26/2021   HGBA1C 11.3 (A) 02/20/2021   HGBA1C 8.4 (A) 11/24/2020   HGBA1C 9.2 (A) 08/23/2020   HGBA1C 14.0 (A) 06/23/2020   HGBA1C 14.5 (H) 06/01/2020   At last visit she was on: -Metformin  ER 500 mg daily -Glimepiride 4 mg before b'fast -Trulicity 0.75 mg weekly-last dose yesterday She previously was [place] but she did not tolerate it due to nausea and vomiting.  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  Pt was not checking blood sugars at last visit, now checking 1-5X a day: - am: 92-155 - 2h after b'fast:  n/c - before lunch: 79-208 - 2h after lunch: 84-179 - before dinner: 192 - 2h after dinner: 120 - bedtime: 172-163 - nighttime: n/c Lowest sugar was 170 >> 90. Highest sugar was HI in 2022 (asthma attack) >> 750 >> 208 (no insulin).  Glucometer: Accu-Chek guide  - no CKD, last BUN/creatinine:  Lab Results  Component Value Date   BUN 7 03/27/2022   BUN 11 10/26/2021   CREATININE 0.83 03/27/2022   CREATININE 0.82 10/26/2021  Previously on losartan 50 mg daily.  Now off.  - last set of lipids: Lab Results  Component Value Date   CHOL 157 10/26/2021   HDL 81.60 10/26/2021   LDLCALC 55 10/26/2021   TRIG 102.0 10/26/2021   CHOLHDL 2 10/26/2021   Previously on Zocor 20 mg daily.  Now off.  - last eye exam was in 2022. No DR.   - no numbness and tingling in her feet.  Foot exam performed April 19, 2022.  Pt has FH of DM in mother.  Of note, she has a history of preterm delivery and preeclampsia with a previous pregnancy.  She is status post cervical cerclage.  She was previously drinking regular sodas, but she stopped earlier this year.  ROS: + see HPI No increased urination, blurry vision, nausea, chest pain.  Past Medical History:  Diagnosis Date   Allergy    Arthritis    Asthma    Gestational diabetes    Hx of chlamydia infection 2017   Pregnancy induced hypertension    Pre-E   Seasonal allergies    Trichomonas 2017   Past Surgical History:  Procedure Laterality Date   CERVICAL CERCLAGE N/A 11/23/2018   Procedure: CERCLAGE CERVICAL;  Surgeon: Reva Bores, MD;  Location: [facility] BIRTHING SU[facility];  Service: Gynecology;  Laterality: N/A;   Social History   Socioeconomic History   Marital status: Single    Spouse name: Not on file   Number of children: 1   Years of education: Not on file   Highest education level: Not on file  Occupational History   Occupation: Childcare  Tobacco  Use   Smoking status: Never   Smokeless tobacco: Never  Vaping Use   Vaping Use: Never used  Substance and Sexual Activity   Alcohol use: Not Currently   Drug use: No   Sexual activity: Yes    Partners: Male    Birth control/protection: None    Comment: with monagamous partner   Other Topics Concern   Not on file  Social History Narrative   Pt is from [place]. She lives in [place] with her mother and child.    Social Determinants of Health   Financial Resource Strain: Medium Risk (01/26/2018)   Overall Financial Resource Strain (CARDIA)    Difficulty of Paying Living Expenses: Somewhat hard  Food Insecurity: Food Insecurity Present (01/26/2018)   Hunger Vital Sign    Worried About Running Out of Food in the Last  Year: Sometimes true    Ran Out of Food in the Last Year: Sometimes true  Transportation Needs: No Transportation Needs (01/26/2018)   PRAPARE - Administrator, Civil Service (Medical): No    Lack of Transportation (Non-Medical): No  Physical Activity: Insufficiently Active (01/26/2018)   Exercise Vital Sign    Days of Exercise per Week: 3 days    Minutes of Exercise per Session: 30 min  Stress: Not on file  Social Connections: Moderately Integrated (01/26/2018)   Social Connection and Isolation Panel [NHANES]    Frequency of Communication with Friends and Family: More than three times a week    Frequency of Social Gatherings with Friends and Family: Twice a week    Attends Religious Services: More than 4 times per year    Active Member of [employer] or Organizations: Yes    Attends Banker Meetings: More than 4 times per year    Marital Status: Never married  Intimate Partner Violence: Not At Risk (01/26/2018)   Humiliation, Afraid, Rape, and Kick questionnaire    Fear of Current or Ex-Partner: No    Emotionally Abused: No    Physically Abused: No    Sexually Abused: No   Current Outpatient Medications on File Prior to Visit  Medication Sig Dispense Refill   glipiZIDE (GLUCOTROL) 5 MG tablet Take 1 tablet (5 mg total) by mouth 2 (two) times daily before a meal. 180 tablet 3   insulin glargine (LANTUS SOLOSTAR) 100 UNIT/ML Solostar Pen Inject 12-18 Units into the skin at bedtime. 30 mL 3   Insulin Pen Needle 32G X 4 MM MISC Use 1x a day 100 each 3   metFORMIN (GLUCOPHAGE-XR) 500 MG 24 hr tablet Take 3 tablets (1,500 mg total) by mouth daily. 270 tablet 3   NIFEdipine (PROCARDIA XL) 30 MG 24 hr tablet Take 1 tablet (30 mg total) by mouth daily. 30 tablet 3   progesterone 200 MG SUPP Place 200 mg vaginally at bedtime.     No current facility-administered medications on file prior to visit.   Allergies  Allergen Reactions   Farxiga [Dapagliflozin] Nausea And  Vomiting   Family History  Problem Relation Age of Onset   Arthritis Mother    Diabetes Mother    Diabetes Maternal Grandmother    Arthritis Maternal Grandmother    PE: LMP 02/23/2022  Wt Readings from Last 3 Encounters:  04/19/22 235 lb 3.2 oz (106.7 kg)  03/28/22 230 lb 3.2 oz (104.4 kg)  12/31/21 217 lb 9.6 oz (98.7 kg)   Constitutional: overweight, in NAD Eyes: PERRLA, EOMI, no exophthalmos ENT: moist mucous membranes, no  thyromegaly, no cervical lymphadenopathy Cardiovascular: RRR, No MRG Respiratory: CTA B Musculoskeletal: no deformities Skin: moist, warm, no rashes Neurological: no tremor with outstretched hands  ASSESSMENT: 1. DM2, insulin-dependent, uncontrolled, with complications, now pregnant in the first trimester - DR - PN  2. HL  PLAN:  1. Patient with longstanding, uncontrolled, type 2 diabetes, with the diabetes control previously worsened by her needle phobia.  She is currently pregnant in the first trimester.  She was previously on metformin, sulfonylurea, and GLP-1 receptor agonist.  We started long-acting insulin and continued metformin and sulfonylurea for pregnancy, however, her sugars dropped into the low so she stopped glipizide before last visit.  HbA1c improved significantly, from 14.6% to 8.5% at last check in 03/2022.  This is still above our target for pregnancy, and that sugars are still mostly above goal, with only few values at goal.  We increased her Lantus dose and continued metformin.  We did discuss that she most likely will need mealtime insulin but, due to her needle phobia, she wanted to avoid this at last visit.  She inquired about an insulin pump, But she was very reticent about this), and we discussed that we do not usually start this during her pregnancy due to significant changes in her metabolic status during the pregnancy.  She was relieved about this.  - I suggested to:  Patient Instructions  Please continue: - Metformin ER 500 mg  3x a day - Lantus 16-18 units at bedtime   Pregnancy targets for blood sugars: Before meals <95, 1h after a meal <140 2h after a meal <120  Please return in 2 months with your sugar log.   - we checked her HbA1c: 7%  - advised to check sugars at different times of the day - 7x a day, rotating check times - advised for yearly eye exams >> she is up-to-date - return to clinic in 2 months  2. HL -Reviewed her latest lipid panel from 10/2021: All fractions at goal. Lab Results  Component Value Date   CHOL 157 10/26/2021   HDL 81.60 10/26/2021   LDLCALC 55 10/26/2021   TRIG 102.0 10/26/2021   CHOLHDL 2 10/26/2021  -Previously on Zocor, but now off during pregnancy.  We will recheck her lipids after she gives birth.  Carlus Pavlov, MD PhD [facility] Endocrinology

## 2022-06-03 ENCOUNTER — Ambulatory Visit: Payer: Medicaid Other | Admitting: Internal Medicine

## 2022-06-12 ENCOUNTER — Ambulatory Visit (INDEPENDENT_AMBULATORY_CARE_PROVIDER_SITE_OTHER): Payer: Medicaid Other | Admitting: Internal Medicine

## 2022-06-12 ENCOUNTER — Encounter: Payer: Self-pay | Admitting: Internal Medicine

## 2022-06-12 DIAGNOSIS — E11319 Type 2 diabetes mellitus with unspecified diabetic retinopathy without macular edema: Secondary | ICD-10-CM

## 2022-06-12 LAB — POCT GLYCOSYLATED HEMOGLOBIN (HGB A1C)

## 2022-06-12 NOTE — Patient Instructions (Addendum)
Please continue: - Metformin ER 500 mg 3x a day - Glimepiride 4 mg before b'fast - Lantus 18 units daily  Please send me the sugars on 06/27/2022.  Please return in 3 months.

## 2022-06-12 NOTE — Progress Notes (Signed)
Patient ID: Denise [name], female   DOB: 1991-08-06, 31 y.o.   MRN: [id number]  HPI: Denise [name] is a 31 y.o.-year-old female, returning for follow-up for DM2, dx in 2020 as GDM first, insulin-dependent but not on insulin, uncontrolled, with complications (DR, PN).  She previously saw Dr. Everardo All.  Last visit 2 months ago. Her mother is my pt:  Denise [name].  Interim history: At last visit, she was pregnant, [redacted] weeks along.  Unfortunately, she had a missed abortion soon afterwards and had to have a D&C. No increased urination, blurry vision, nausea, chest pain. Since last visit, she has not been checking blood sugars (she only had 1 checked at 220), but she did take her diabetic medications.  Reviewed HbA1c: Lab Results  Component Value Date   HGBA1C 8.5 (A) 03/28/2022   HGBA1C 14.6 (A) 10/26/2021   HGBA1C 11.3 (A) 02/20/2021   HGBA1C 8.4 (A) 11/24/2020   HGBA1C 9.2 (A) 08/23/2020   HGBA1C 14.0 (A) 06/23/2020   HGBA1C 14.5 (H) 06/01/2020   Previously on: -Metformin  ER 500 mg daily -Glimepiride 4 mg before b'fast -Trulicity 0.75 mg weekly She previously was [place] but she did not tolerate it due to nausea and vomiting.  Currently on: - Metformin ER 500 mg 3x a day - Glimepiride 4 mg before b'fast - Lantus 12 >> 16 >> 14 >> 16-18 >> 18 units daily  She has needle phobia, but does well with insulin injections.  Pt was not checking blood sugars at last visit >> still not checking. From last OVs: - am: 92-155 - 2h after b'fast:  n/c - before lunch: 79-208 - 2h after lunch: 84-179 - before dinner: 192 - 2h after dinner: 120 - bedtime: 172-163 - nighttime: n/c Lowest sugar was 170 >> 90 >> ?. Highest sugar was HI in 2022 (asthma attack) >> 750 >> 208 (no insulin) >> 220.  Glucometer: Accu-Chek guide  - no CKD, last BUN/creatinine:  Lab Results  Component Value Date   BUN 7 03/27/2022   BUN 11 10/26/2021   CREATININE 0.83 03/27/2022   CREATININE 0.82 10/26/2021   Previously on losartan 50 mg daily.  Now off.  -+ HL; last set of lipids: Lab Results  Component Value Date   CHOL 157 10/26/2021   HDL 81.60 10/26/2021   LDLCALC 55 10/26/2021   TRIG 102.0 10/26/2021   CHOLHDL 2 10/26/2021  Previously on Zocor 20 mg daily.  Now off.  - last eye exam was in 2022. No DR.   - no numbness and tingling in her feet.  Foot exam performed April 19, 2022.  Pt has FH of DM in mother.  Of note, she has a history of preterm delivery and preeclampsia with a previous pregnancy.  She is status post cervical cerclage.  She was previously drinking regular sodas, but she stopped earlier 2023.  ROS: + see HPI No increased urination, blurry vision, nausea, chest pain.  Past Medical History:  Diagnosis Date   Allergy    Arthritis    Asthma    Gestational diabetes    Hx of chlamydia infection 2017   Pregnancy induced hypertension    Pre-E   Seasonal allergies    Trichomonas 2017   Past Surgical History:  Procedure Laterality Date   CERVICAL CERCLAGE N/A 11/23/2018   Procedure: CERCLAGE CERVICAL;  Surgeon: Reva Bores, MD;  Location: [facility] BIRTHING SUIT[facility];  Service: Gynecology;  Laterality: N/A;   Social History   Socioeconomic History  Marital status: Single    Spouse name: Not on file   Number of children: 1   Years of education: Not on file   Highest education level: Not on file  Occupational History   Occupation: Childcare  Tobacco Use   Smoking status: Never   Smokeless tobacco: Never  Vaping Use   Vaping Use: Never used  Substance and Sexual Activity   Alcohol use: Not Currently   Drug use: No   Sexual activity: Yes    Partners: Male    Birth control/protection: None    Comment: with monagamous partner   Other Topics Concern   Not on file  Social History Narrative   Pt is from [place]. She lives in [place] with her mother and child.    Social Determinants of Health   Financial Resource Strain: Medium Risk (01/26/2018)    Overall Financial Resource Strain (CARDIA)    Difficulty of Paying Living Expenses: Somewhat hard  Food Insecurity: Food Insecurity Present (01/26/2018)   Hunger Vital Sign    Worried About Running Out of Food in the Last Year: Sometimes true    Ran Out of Food in the Last Year: Sometimes true  Transportation Needs: No Transportation Needs (01/26/2018)   PRAPARE - Hydrologist (Medical): No    Lack of Transportation (Non-Medical): No  Physical Activity: Insufficiently Active (01/26/2018)   Exercise Vital Sign    Days of Exercise per Week: 3 days    Minutes of Exercise per Session: 30 min  Stress: Not on file  Social Connections: Moderately Integrated (01/26/2018)   Social Connection and Isolation Panel [NHANES]    Frequency of Communication with Friends and Family: More than three times a week    Frequency of Social Gatherings with Friends and Family: Twice a week    Attends Religious Services: More than 4 times per year    Active Member of [employer] or Organizations: Yes    Attends Archivist Meetings: More than 4 times per year    Marital Status: Never married  Intimate Partner Violence: Not At Risk (01/26/2018)   Humiliation, Afraid, Rape, and Kick questionnaire    Fear of Current or Ex-Partner: No    Emotionally Abused: No    Physically Abused: No    Sexually Abused: No   Current Outpatient Medications on File Prior to Visit  Medication Sig Dispense Refill   glipiZIDE (GLUCOTROL) 5 MG tablet Take 1 tablet (5 mg total) by mouth 2 (two) times daily before a meal. 180 tablet 3   insulin glargine (LANTUS SOLOSTAR) 100 UNIT/ML Solostar Pen Inject 12-18 Units into the skin at bedtime. 30 mL 3   Insulin Pen Needle 32G X 4 MM MISC Use 1x a day 100 each 3   metFORMIN (GLUCOPHAGE-XR) 500 MG 24 hr tablet Take 3 tablets (1,500 mg total) by mouth daily. 270 tablet 3   NIFEdipine (PROCARDIA XL) 30 MG 24 hr tablet Take 1 tablet (30 mg total) by mouth daily. 30  tablet 3   progesterone 200 MG SUPP Place 200 mg vaginally at bedtime.     No current facility-administered medications on file prior to visit.   Allergies  Allergen Reactions   Farxiga [Dapagliflozin] Nausea And Vomiting   Family History  Problem Relation Age of Onset   Arthritis Mother    Diabetes Mother    Diabetes Maternal Grandmother    Arthritis Maternal Grandmother    PE: LMP 02/23/2022  Wt Readings from Last  3 Encounters:  04/19/22 235 lb 3.2 oz (106.7 kg)  03/28/22 230 lb 3.2 oz (104.4 kg)  12/31/21 217 lb 9.6 oz (98.7 kg)   Constitutional: overweight, in NAD Eyes: EOMI, no exophthalmos ENT: moist mucous membranes, no thyromegaly, no cervical lymphadenopathy Cardiovascular: RRR, No MRG Respiratory: CTA B Musculoskeletal: no deformities Skin: moist, warm, no rashes Neurological: no tremor with outstretched hands  ASSESSMENT: 1. DM2, insulin-dependent, uncontrolled, with complications, now pregnant in the first trimester - DR - PN  2. HL  PLAN:  1. Patient with longstanding, uncontrolled, type 2 diabetes, with her diabetes control previously worsened by her needle phobia.  However, afterwards, she accepted insulin.  At last visit, she was pregnant in the first trimester, on long-acting insulin and metformin.  She was previously on sulfonylurea, metformin, and GLP-1 receptor agonist, but we stopped glimepiride and Trulicity for the pregnancy.  Unfortunately, she had a miscarriage afterwards.  She is currently on insulin, metformin, and glimepiride. -At today's visit, she is not checking any blood sugars.  However, she plans to start.  She mentions that she is taking her medications consistently.  She saw high blood sugar 220 around the time of her D&C, but she has not checked the sugars otherwise.  I advised her to start checking and send me her blood sugars in 2 weeks.  At that time, we will most likely need to adjust her regimen. - I suggested to:  Patient  Instructions  Please continue: - Metformin ER 500 mg 3x a day - Glimepiride 4 mg before b'fast - Lantus 18 units daily  Please send me the sugars on 06/27/2022.  Please return in 3 months.  - at today's visit, she refused an HbA1c - advised to check sugars at different times of the day - 1-2x a day, rotating check times - advised for yearly eye exams >> she is UTD - return to clinic in 3 months  2. HL -Reviewed latest lipid panel from 10/2021: Fractions at goal: Lab Results  Component Value Date   CHOL 157 10/26/2021   HDL 81.60 10/26/2021   LDLCALC 55 10/26/2021   TRIG 102.0 10/26/2021   CHOLHDL 2 10/26/2021  -Previously on Zocor but off during the pregnancy. -She is planning for another pregnancy, so for now, we will not restart Zocor  Carlus Pavlov, MD PhD [facility] Endocrinology

## 2022-07-24 DIAGNOSIS — K047 Periapical abscess without sinus: Secondary | ICD-10-CM | POA: Diagnosis not present

## 2022-07-24 DIAGNOSIS — Z3202 Encounter for pregnancy test, result negative: Secondary | ICD-10-CM | POA: Diagnosis not present

## 2022-11-25 DIAGNOSIS — F4321 Adjustment disorder with depressed mood: Secondary | ICD-10-CM | POA: Diagnosis not present

## 2022-11-30 DIAGNOSIS — F4321 Adjustment disorder with depressed mood: Secondary | ICD-10-CM | POA: Diagnosis not present

## 2022-12-07 DIAGNOSIS — F4321 Adjustment disorder with depressed mood: Secondary | ICD-10-CM | POA: Diagnosis not present

## 2022-12-20 DIAGNOSIS — Z299 Encounter for prophylactic measures, unspecified: Secondary | ICD-10-CM | POA: Diagnosis not present

## 2022-12-20 DIAGNOSIS — L02215 Cutaneous abscess of perineum: Secondary | ICD-10-CM | POA: Diagnosis not present

## 2022-12-23 DIAGNOSIS — F4321 Adjustment disorder with depressed mood: Secondary | ICD-10-CM | POA: Diagnosis not present

## 2023-01-07 DIAGNOSIS — F4321 Adjustment disorder with depressed mood: Secondary | ICD-10-CM | POA: Diagnosis not present

## 2023-01-15 DIAGNOSIS — F4321 Adjustment disorder with depressed mood: Secondary | ICD-10-CM | POA: Diagnosis not present

## 2023-01-22 DIAGNOSIS — F4321 Adjustment disorder with depressed mood: Secondary | ICD-10-CM | POA: Diagnosis not present

## 2023-01-24 ENCOUNTER — Other Ambulatory Visit: Payer: Self-pay

## 2023-01-24 ENCOUNTER — Ambulatory Visit: Payer: Medicaid Other | Admitting: Internal Medicine

## 2023-01-28 NOTE — Progress Notes (Signed)
Patient ID: Denise [name], female   DOB: 12/15/1990, 32 y.o.   MRN: [id number]  HPI: Denise [name] is a 32 y.o.-year-old female, returning for follow-up for DM2, dx in 2020 as GDM first, insulin-dependent but not on insulin, uncontrolled, with complications (DR, PN).  She previously saw Dr. Everardo All.  Last visit 10 months ago. Her mother is my pt:  [name].  Interim history: No increased urination, blurry vision, nausea, chest pain. She had a miscarriage in spring  2023.  She had a hard time coping with this and has been in therapy.  She still has insomnia. She lost weight since last OV. She tried to Trulicity since last OV >> severe nausea and diarrhea >> had to stop.  Reviewed HbA1c: Lab Results  Component Value Date   HGBA1C 8.5 (A) 03/28/2022   HGBA1C 14.6 (A) 10/26/2021   HGBA1C 11.3 (A) 02/20/2021   HGBA1C 8.4 (A) 11/24/2020   HGBA1C 9.2 (A) 08/23/2020   HGBA1C 14.0 (A) 06/23/2020   HGBA1C 14.5 (H) 06/01/2020   Previously on: -Metformin  ER 500 mg daily -Glimepiride 4 mg before b'fast -Trulicity 0.75 mg weekly She previously was [place] but she did not tolerate it due to nausea and vomiting.  Currently on: - Metformin ER 500 mg 3x a day >> ran out last week - Glimepiride 4 mg before b'fast >> ran out 2 mo >> Glipizide 5 mg 2x a day - inconsistently - Lantus 12 >> 16 >> 14 >> 16-18 >> 18 >> actually taking 18-30 units daily  She has needle phobia, but does well with insulin injections.  She was not checking blood sugars in the last 2 visits.  At last visit she only had 1 blood sugar checked at 220. Now checking occasionally: - am: 92-155 >> 190-200 - 2h after b'fast:  n/c - before lunch: 79-208 >> n/c - 2h after lunch: 84-179 >> n/c - before dinner: 192 >> n/c - 2h after dinner: 120 >> 205 (w/o Metformin) - bedtime: 172-163 >> n/c - nighttime: n/c Lowest sugar was 170 >> 90 >> ?. Highest sugar was HI in 2022 (asthma attack) >> 750 >> 208 (no insulin) >> 220  >> 205  Glucometer: Accu-Chek guide  - no CKD, last BUN/creatinine:  Lab Results  Component Value Date   BUN 7 03/27/2022   BUN 11 10/26/2021   CREATININE 0.83 03/27/2022   CREATININE 0.82 10/26/2021  Previously on losartan 50 mg daily.  Now off.  -+ HL; last set of lipids: Lab Results  Component Value Date   CHOL 157 10/26/2021   HDL 81.60 10/26/2021   LDLCALC 55 10/26/2021   TRIG 102.0 10/26/2021   CHOLHDL 2 10/26/2021  Previously on Zocor 20 mg daily.  Now off.  - last eye exam was in 2023. No DR reportedly.   - no numbness and tingling in her feet.  Foot exam performed April 19, 2022.  Pt has FH of DM in mother.  Of note, she has a history of preterm delivery and preeclampsia with a previous pregnancy.  She is status post cervical cerclage.  She was previously drinking regular sodas, but she stopped earlier 2023.  ROS: + see HPI  Past Medical History:  Diagnosis Date   Allergy    Arthritis    Asthma    Gestational diabetes    Hx of chlamydia infection 2017   Pregnancy induced hypertension    Pre-E   Seasonal allergies    Trichomonas 2017  Past Surgical History:  Procedure Laterality Date   CERVICAL CERCLAGE N/A 11/23/2018   Procedure: CERCLAGE CERVICAL;  Surgeon: Reva Bores, MD;  Location: [facility];  Service: Gynecology;  Laterality: N/A;   Social History   Socioeconomic History   Marital status: Single    Spouse name: Not on file   Number of children: 1   Years of education: Not on file   Highest education level: Not on file  Occupational History   Occupation: Childcare  Tobacco Use   Smoking status: Never   Smokeless tobacco: Never  Vaping Use   Vaping Use: Never used  Substance and Sexual Activity   Alcohol use: Not Currently   Drug use: No   Sexual activity: Yes    Partners: Male    Birth control/protection: None    Comment: with monagamous partner   Other Topics Concern   Not on file  Social History Narrative   Pt is  from [place]. She lives in [place] with her mother and child.    Social Determinants of Health   Financial Resource Strain: Medium Risk (01/26/2018)   Overall Financial Resource Strain (CARDIA)    Difficulty of Paying Living Expenses: Somewhat hard  Food Insecurity: Food Insecurity Present (01/26/2018)   Hunger Vital Sign    Worried About Running Out of Food in the Last Year: Sometimes true    Ran Out of Food in the Last Year: Sometimes true  Transportation Needs: No Transportation Needs (01/26/2018)   PRAPARE - Administrator, Civil Service (Medical): No    Lack of Transportation (Non-Medical): No  Physical Activity: Insufficiently Active (01/26/2018)   Exercise Vital Sign    Days of Exercise per Week: 3 days    Minutes of Exercise per Session: 30 min  Stress: Not on file  Social Connections: Moderately Integrated (01/26/2018)   Social Connection and Isolation Panel [NHANES]    Frequency of Communication with Friends and Family: More than three times a week    Frequency of Social Gatherings with Friends and Family: Twice a week    Attends Religious Services: More than 4 times per year    Active Member of [employer] or Organizations: Yes    Attends Banker Meetings: More than 4 times per year    Marital Status: Never married  Intimate Partner Violence: Not At Risk (01/26/2018)   Humiliation, Afraid, Rape, and Kick questionnaire    Fear of Current or Ex-Partner: No    Emotionally Abused: No    Physically Abused: No    Sexually Abused: No   Current Outpatient Medications on File Prior to Visit  Medication Sig Dispense Refill   glipiZIDE (GLUCOTROL) 5 MG tablet Take 1 tablet (5 mg total) by mouth 2 (two) times daily before a meal. 180 tablet 3   insulin glargine (LANTUS SOLOSTAR) 100 UNIT/ML Solostar Pen Inject 12-18 Units into the skin at bedtime. 30 mL 3   Insulin Pen Needle 32G X 4 MM MISC Use 1x a day 100 each 3   metFORMIN (GLUCOPHAGE-XR) 500 MG 24 hr  tablet Take 3 tablets (1,500 mg total) by mouth daily. 270 tablet 3   NIFEdipine (PROCARDIA XL) 30 MG 24 hr tablet Take 1 tablet (30 mg total) by mouth daily. 30 tablet 3   progesterone 200 MG SUPP Place 200 mg vaginally at bedtime.     No current facility-administered medications on file prior to visit.   Allergies  Allergen Reactions   Marcelline Deist [Dapagliflozin]  Nausea And Vomiting   Family History  Problem Relation Age of Onset   Arthritis Mother    Diabetes Mother    Diabetes Maternal Grandmother    Arthritis Maternal Grandmother    PE: BP 120/82 (BP Location: Right Arm, Patient Position: Sitting, Cuff Size: Normal)   Pulse 72   Ht 5\' 6"  (1.676 m)   Wt 225 lb (102.1 kg)   LMP 02/23/2022   SpO2 99%   BMI 36.32 kg/m  Wt Readings from Last 3 Encounters:  01/29/23 225 lb (102.1 kg)  04/19/22 235 lb 3.2 oz (106.7 kg)  03/28/22 230 lb 3.2 oz (104.4 kg)   Constitutional: overweight, in NAD Eyes: EOMI, no exophthalmos ENT: no thyromegaly, no cervical lymphadenopathy Cardiovascular: RRR, No MRG Respiratory: CTA B Musculoskeletal: no deformities Skin:  no rashes Neurological: no tremor with outstretched hands  ASSESSMENT: 1. DM2, insulin-dependent, uncontrolled, with complications, now pregnant in the first trimester - DR - PN  2. HL  PLAN:  1. Patient with longstanding, uncontrolled, type 2 diabetes, with her diabetes control previously worsened by her needle phobia.  Afterwards, however, she accepted insulin.  She returns after another long absence, often months.  At last visit she was taking metformin, sulfonylurea, and long-acting insulin, but was not checking blood sugars.  She also refused to get her HbA1c checked.  We discussed that we absolutely need to check at upcoming visits.  I strongly advised her to start checking blood sugars and to send them to me in 2 weeks.  She did not do so... -At today's visit, she returns after another long absence.  She is varying the  dose of Lantus too much, between 18 and 30 units, is off-and-on sulfonylureas, and ran out of metformin.  We discussed that this is not conducive to good control.  She is also checking sugars inconsistently.  They are elevated, but not as much as predicted from the HbA1c.  I suspect that we are missing some very high blood sugars. -At today's visit, we will restart metformin, increase Lantus and I advised her not to vary the dose anymore.  We also need mealtime insulin.  I recommended Lyumjev, which can be injected at the start of the meal and discussed about how to vary the dose of insulin based on the size and consistency of her meals.  I did encourage her to vary the dose depending on blood sugars. - I suggested to:  Patient Instructions  Please restart: - Metformin ER 500 mg 3x a day  Stop: - Glimepiride/Glimepiride  Increase: - Lantus 30 units daily  Start: - Lyumjev 8-12 units before the main meals  Please return in 3 months.  - we checked her HbA1c: 14.1% (very high) - advised to check sugars at different times of the day - 3-4x a day, rotating check times - advised for yearly eye exams >> she is UTD - return to clinic in 3 months  2. HL -Reviewed latest lipid panel from 10/2021: All fractions at goal: Lab Results  Component Value Date   CHOL 157 10/26/2021   HDL 81.60 10/26/2021   LDLCALC 55 10/26/2021   TRIG 102.0 10/26/2021   CHOLHDL 2 10/26/2021  -She was previously on Zocor but then off due to the desire for pregnancy  Carlus Pavlov, MD PhD [facility] Endocrinology

## 2023-01-29 ENCOUNTER — Ambulatory Visit: Payer: Medicaid Other | Admitting: Internal Medicine

## 2023-01-29 ENCOUNTER — Encounter: Payer: Self-pay | Admitting: Internal Medicine

## 2023-01-29 VITALS — BP 120/82 | HR 72 | Ht 66.0 in | Wt 225.0 lb

## 2023-01-29 DIAGNOSIS — E11319 Type 2 diabetes mellitus with unspecified diabetic retinopathy without macular edema: Secondary | ICD-10-CM

## 2023-01-29 DIAGNOSIS — E785 Hyperlipidemia, unspecified: Secondary | ICD-10-CM | POA: Diagnosis not present

## 2023-01-29 LAB — POCT GLYCOSYLATED HEMOGLOBIN (HGB A1C): Hemoglobin A1C: 14.1 % — AB (ref 4.0–5.6)

## 2023-01-29 MED ORDER — LYUMJEV KWIKPEN 100 UNIT/ML ~~LOC~~ SOPN: PEN_INJECTOR | SUBCUTANEOUS | 3 refills | Status: DC

## 2023-01-29 MED ORDER — ACCU-CHEK GUIDE VI STRP: ORAL_STRIP | 12 refills | Status: DC

## 2023-01-29 MED ORDER — LANTUS SOLOSTAR 100 UNIT/ML ~~LOC~~ SOPN: 30.0000 [IU] | PEN_INJECTOR | Freq: Every day | SUBCUTANEOUS | 3 refills | Status: DC

## 2023-01-29 MED ORDER — INSULIN PEN NEEDLE 32G X 4 MM MISC: 3 refills | Status: DC

## 2023-01-29 MED ORDER — METFORMIN HCL ER 500 MG PO TB24: 1500.0000 mg | ORAL_TABLET | Freq: Every day | ORAL | 3 refills | Status: DC

## 2023-01-29 MED ORDER — ACCU-CHEK GUIDE VI STRP: ORAL_STRIP | 3 refills | Status: DC

## 2023-01-29 NOTE — Patient Instructions (Addendum)
Please restart: - Metformin ER 500 mg 3x a day  Stop: - Glimepiride/Glimepiride  Increase: - Lantus 30 units daily  Start: - Lyumjev 8-12 units before the main meals  Please return in 3 months.

## 2023-01-30 ENCOUNTER — Telehealth: Payer: Self-pay

## 2023-01-30 DIAGNOSIS — E11319 Type 2 diabetes mellitus with unspecified diabetic retinopathy without macular edema: Secondary | ICD-10-CM

## 2023-01-30 NOTE — Telephone Encounter (Signed)
Pt called to advise Lyumjev requires prior auth. Message sent to the PA team to submit request.

## 2023-02-03 ENCOUNTER — Other Ambulatory Visit (HOSPITAL_COMMUNITY): Payer: Self-pay

## 2023-02-03 ENCOUNTER — Telehealth: Payer: Self-pay | Admitting: Pharmacy Technician

## 2023-02-03 NOTE — Telephone Encounter (Signed)
Pharmacy Patient Advocate Encounter   Received notification from Staff msgs/RMA that prior authorization for Lyumjev is required/requested.   PA started on 02/03/23 to (ins) [place] Medicaid via [employer] or ([place]) confirmation # [id number] - PA Case ID: [id number]  This is one of the first questions on the PA form:  Has the patient tried and failed ONE formulary preferred medication? I don't see that she has tried any other short acting insulin. Please correct me if I'm wrong. Humalog, Novolog and their generics are preferred.   Could you answer these other 2 questions?  Is the patient currently taking a drug which has a drug-to-drug interaction with all the preferred drugs?   Did the patient have a previous episode of an unacceptable side effect or therapeutic failure with all the preferred drugs?

## 2023-02-03 NOTE — Telephone Encounter (Signed)
Can we check if Claiborne Billings is covered? Same doses: 8-12 units before the main meals.  If not, we can use Humalog or NovoLog 15 minutes before meals, same doses. Ty! C

## 2023-02-03 NOTE — Telephone Encounter (Signed)
-----   Message from Lauralyn Primes, [place] sent at 01/30/2023  9:06 AM EDT ----- Regarding: PA Pt received notification Lyumjev requires prior approval. Can we submit request. Thank you.

## 2023-02-04 ENCOUNTER — Other Ambulatory Visit (HOSPITAL_COMMUNITY): Payer: Self-pay

## 2023-02-04 MED ORDER — FIASP FLEXTOUCH 100 UNIT/ML ~~LOC~~ SOPN: 8.0000 [IU] | PEN_INJECTOR | Freq: Three times a day (TID) | SUBCUTANEOUS | 0 refills | Status: DC

## 2023-02-04 NOTE — Telephone Encounter (Signed)
Rx sent for Fiasp

## 2023-02-04 NOTE — Telephone Encounter (Signed)
   Fiasp not covered. Plan prefers Lantus, Humalog, and Novolog.

## 2023-02-05 DIAGNOSIS — F4321 Adjustment disorder with depressed mood: Secondary | ICD-10-CM | POA: Diagnosis not present

## 2023-02-06 MED ORDER — INSULIN LISPRO (1 UNIT DIAL) 100 UNIT/ML (KWIKPEN): 8.0000 [IU] | PEN_INJECTOR | Freq: Three times a day (TID) | SUBCUTANEOUS | 1 refills | Status: DC

## 2023-02-06 NOTE — Addendum Note (Signed)
Addended by: Lauralyn Primes on: 02/06/2023 06:22 PM   Modules accepted: Orders

## 2023-02-06 NOTE — Telephone Encounter (Signed)
Rx sent 

## 2023-02-06 NOTE — Telephone Encounter (Signed)
We can use Humalog - same dose - 15 min before meals.

## 2023-04-24 DIAGNOSIS — F4321 Adjustment disorder with depressed mood: Secondary | ICD-10-CM | POA: Diagnosis not present

## 2023-05-02 DIAGNOSIS — F4321 Adjustment disorder with depressed mood: Secondary | ICD-10-CM | POA: Diagnosis not present

## 2023-05-06 DIAGNOSIS — F4321 Adjustment disorder with depressed mood: Secondary | ICD-10-CM | POA: Diagnosis not present

## 2023-05-13 DIAGNOSIS — F4321 Adjustment disorder with depressed mood: Secondary | ICD-10-CM | POA: Diagnosis not present

## 2023-06-04 ENCOUNTER — Ambulatory Visit (INDEPENDENT_AMBULATORY_CARE_PROVIDER_SITE_OTHER): Payer: Medicaid Other | Admitting: Internal Medicine

## 2023-06-04 ENCOUNTER — Encounter: Payer: Self-pay | Admitting: Internal Medicine

## 2023-06-04 VITALS — HR 78 | Ht 66.0 in | Wt 233.0 lb

## 2023-06-04 DIAGNOSIS — E785 Hyperlipidemia, unspecified: Secondary | ICD-10-CM

## 2023-06-04 DIAGNOSIS — Z794 Long term (current) use of insulin: Secondary | ICD-10-CM | POA: Diagnosis not present

## 2023-06-04 DIAGNOSIS — E119 Type 2 diabetes mellitus without complications: Secondary | ICD-10-CM

## 2023-06-04 DIAGNOSIS — Z7984 Long term (current) use of oral hypoglycemic drugs: Secondary | ICD-10-CM | POA: Diagnosis not present

## 2023-06-04 DIAGNOSIS — E11319 Type 2 diabetes mellitus with unspecified diabetic retinopathy without macular edema: Secondary | ICD-10-CM | POA: Diagnosis not present

## 2023-06-04 MED ORDER — INSULIN LISPRO (1 UNIT DIAL) 100 UNIT/ML (KWIKPEN)
10.0000 [IU] | PEN_INJECTOR | Freq: Three times a day (TID) | SUBCUTANEOUS | Status: DC
Start: 2023-06-04 — End: 2024-08-17

## 2023-06-04 MED ORDER — LANTUS SOLOSTAR 100 UNIT/ML ~~LOC~~ SOPN: 36.0000 [IU] | PEN_INJECTOR | Freq: Every day | SUBCUTANEOUS | Status: DC

## 2023-06-04 MED ORDER — ACCU-CHEK GUIDE W/DEVICE KIT: PACK | 0 refills | Status: AC

## 2023-06-04 NOTE — Progress Notes (Signed)
Patient ID: Denise [name], female   DOB: 02/19/1991, 32 y.o.   MRN: [id number]  HPI: Denise [name] is a 32 y.o.-year-old female, returning for follow-up for DM2, dx in 2020 as GDM first, insulin-dependent but not on insulin, uncontrolled, with complications (DR, PN).  She previously saw Dr. Everardo All.  Last visit 4 months ago.  Previous visit 10 months prior. Her mother is my pt:  Denise [name].  Interim history: No increased urination, blurry vision, nausea, chest pain. She restarted exercising recently. She was previously drinking regular sodas, but she stopped earlier 2023.  She tells me that she is still drinking these when she takes her medicines.  Reviewed HbA1c: Lab Results  Component Value Date   HGBA1C 14.1 (A) 01/29/2023   HGBA1C 8.5 (A) 03/28/2022   HGBA1C 14.6 (A) 10/26/2021   HGBA1C 11.3 (A) 02/20/2021   HGBA1C 8.4 (A) 11/24/2020   HGBA1C 9.2 (A) 08/23/2020   HGBA1C 14.0 (A) 06/23/2020   HGBA1C 14.5 (H) 06/01/2020   Previously on: -Metformin  ER 500 mg daily -Glimepiride 4 mg before b'fast -Trulicity 0.75 mg weekly She previously was [place] but she did not tolerate it due to nausea and vomiting.  At last visit she was on: - Metformin ER 500 mg 3x a day >> ran out - Glimepiride 4 mg before b'fast >> ran out 2 mo >> Glipizide 5 mg 2x a day - inconsistently - Lantus 12 >> 16 >> 14 >> 16-18 >> 18 >> actually taking 18-30 units daily  I recommended the following regimen: - Metformin ER 500 mg 3x a day >> 1500 mg with the largest meal - Lantus 30 units daily - Lyumjev-not covered  >> Humalog 8-12 >> 10 units before the main meals In 2023 she had severe nausea and diarrhea with Trulicity and had to stop.  She has needle phobia, but does well with insulin injections.  She is checking sugars occasionally: - am: 92-155 >> 190-200 >> 97, 175-201 - 2h after b'fast:  n/c - before lunch: 79-208 >> n/c - 2h after lunch: 84-179 >> n/c - before dinner: 192 >> n/c - 2h  after dinner: 120 >> 205 (w/o Metformin) >> 160-200 - bedtime: 172-163 >> n/c - nighttime: n/c Lowest sugar was 170 >> 90 >> 97. Highest sugar was HI in 2022 (asthma attack) >> 750 >> ... 205 >> 201  Glucometer: Accu-Chek guide  - no CKD, last BUN/creatinine:  Lab Results  Component Value Date   BUN 7 03/27/2022   BUN 11 10/26/2021   CREATININE 0.83 03/27/2022   CREATININE 0.82 10/26/2021  Previously on losartan 50 mg daily.  Now off.  -+ HL; last set of lipids: Lab Results  Component Value Date   CHOL 157 10/26/2021   HDL 81.60 10/26/2021   LDLCALC 55 10/26/2021   TRIG 102.0 10/26/2021   CHOLHDL 2 10/26/2021  Previously on Zocor 20 mg daily.  Now off.  - last eye exam was in 2023. No DR reportedly.  She has an appointment coming up next month.  - no numbness and tingling in her feet.  Foot exam performed April 19, 2022.  Pt has FH of DM in mother.  Of note, she has a history of preterm delivery and preeclampsia with a previous pregnancy.  She is status post cervical cerclage. She had a miscarriage in spring  2023.  She had a hard time coping with this and has been in therapy.   ROS: + see HPI  Past Medical  History:  Diagnosis Date   Allergy    Arthritis    Asthma    Gestational diabetes    Hx of chlamydia infection 2017   Pregnancy induced hypertension    Pre-E   Seasonal allergies    Trichomonas 2017   Past Surgical History:  Procedure Laterality Date   CERVICAL CERCLAGE N/A 11/23/2018   Procedure: CERCLAGE CERVICAL;  Surgeon: Reva Bores, MD;  Location: [facility]IR[facility] SUIT[facility];  Service: Gynecology;  Laterality: N/A;   Social History   Socioeconomic History   Marital status: Single    Spouse name: Not on file   Number of children: 1   Years of education: Not on file   Highest education level: Not on file  Occupational History   Occupation: Childcare  Tobacco Use   Smoking status: Never   Smokeless tobacco: Never  Vaping Use   Vaping status: Never  Used  Substance and Sexual Activity   Alcohol use: Not Currently   Drug use: No   Sexual activity: Yes    Partners: Male    Birth control/protection: None    Comment: with monagamous partner   Other Topics Concern   Not on file  Social History Narrative   Pt is from [place]. She lives in [place] with her mother and child.    Social Determinants of Health   Financial Resource Strain: Medium Risk (01/26/2018)   Overall Financial Resource Strain (CARDIA)    Difficulty of Paying Living Expenses: Somewhat hard  Food Insecurity: No Food Insecurity (05/10/2021)   Received from [facility], [facility]   Hunger Vital Sign    Worried About Running Out of Food in the Last Year: Never true    Ran Out of Food in the Last Year: Never true  Transportation Needs: No Transportation Needs (01/26/2018)   PRAPARE - Administrator, Civil Service (Medical): No    Lack of Transportation (Non-Medical): No  Physical Activity: Insufficiently Active (01/26/2018)   Exercise Vital Sign    Days of Exercise per Week: 3 days    Minutes of Exercise per Session: 30 min  Stress: No Stress Concern Present (05/13/2022)   Received from [facility], [facility] of Occupational Health - Occupational Stress Questionnaire    Feeling of Stress : Not at all  Social Connections: Unknown (03/24/2022)   Received from [facility], [facility]   Social Network    Social Network: Not on file  Intimate Partner Violence: Unknown (02/22/2022)   Received from [facility], [facility]   HITS    Physically Hurt: Not on file    Insult or Talk Down To: Not on file    Threaten Physical Harm: Not on file    Scream or Curse: Not on file   Current Outpatient Medications on File Prior to Visit  Medication Sig Dispense Refill   glucose blood (ACCU-CHEK GUIDE) test strip Use as instructed 3-4x a day 300 each 3   insulin glargine (LANTUS SOLOSTAR) 100 UNIT/ML Solostar Pen Inject 30  Units into the skin at bedtime. 30 mL 3   insulin lispro (HUMALOG KWIKPEN) 100 UNIT/ML KwikPen Inject 8-12 Units into the skin 3 (three) times daily. 30 mL 1   Insulin Pen Needle 32G X 4 MM MISC Use 4x a day 300 each 3   metFORMIN (GLUCOPHAGE-XR) 500 MG 24 hr tablet Take 3 tablets (1,500 mg total) by mouth daily. 270 tablet 3   NIFEdipine (PROCARDIA XL) 30 MG  24 hr tablet Take 1 tablet (30 mg total) by mouth daily. 30 tablet 3   progesterone 200 MG SUPP Place 200 mg vaginally at bedtime.     No current facility-administered medications on file prior to visit.   Allergies  Allergen Reactions   Farxiga [Dapagliflozin] Nausea And Vomiting   Family History  Problem Relation Age of Onset   Arthritis Mother    Diabetes Mother    Diabetes Maternal Grandmother    Arthritis Maternal Grandmother    PE: Pulse 78   Ht 5\' 6"  (1.676 m)   Wt 233 lb (105.7 kg)   SpO2 98%   BMI 37.61 kg/m Pt. declined blood pressure check. Wt Readings from Last 3 Encounters:  06/04/23 233 lb (105.7 kg)  01/29/23 225 lb (102.1 kg)  04/19/22 235 lb 3.2 oz (106.7 kg)   Constitutional: overweight, in NAD Eyes: EOMI, no exophthalmos ENT: no thyromegaly, no cervical lymphadenopathy Cardiovascular: RRR, No MRG Respiratory: CTA B Musculoskeletal: no deformities Skin:  no rashes Neurological: no tremor with outstretched hands Diabetic Foot Exam - Simple   Simple Foot Form Diabetic Foot exam was performed with the following findings: Yes 06/04/2023  8:49 AM  Visual Inspection No deformities, no ulcerations, no other skin breakdown bilaterally: Yes Sensation Testing Intact to touch and monofilament testing bilaterally: Yes Pulse Check Posterior Tibialis and Dorsalis pulse intact bilaterally: Yes Comments    ASSESSMENT: 1. DM2, insulin-dependent, uncontrolled, with complications, now pregnant in the first trimester - DR - PN  2. HL  PLAN:  1. Patient with longstanding, very uncontrolled type 2 diabetes,  with her diabetes control previously worsened by her needle phobia.  She accepted insulin but she compliant with appointments and medications.  At last visit, she returned after another long absence.  She was varying the dose of Lantus too much, was off and on sulfonylureas and ran out of metformin.  We restarted metformin, increased her Lantus dose and advised her to keep a stable dose of 30 units and I also recommended mealtime insulin while stopping sulfonylurea.  I advised her to vary the dose of rapid acting insulin with a size and consistency of her meals.  HbA1c at last visit was extremely high, at 14.1%, increased from 8.5%. -At today's visit, she tells me that she is taking her insulin consistently and she also takes the metformin all 3 doses with the largest meal of the day.  She tolerates these well with only occasional loose stools.  She has low appetite.  She sometimes forgets to eat. -Reviewing the blood sugars at home, they still appear to be above target in the morning, up to 200s, and they are also up to 200 at night.  Overall, I suspect they improved, but she did not check sugars consistently at last visit to really compare them.  I advised her to increase her Lantus dose and also to use a higher dose of Humalog with larger meals.  Since she is drinking regular sodas when taking medicines, I advised her to try to stop these.  As she does not like plain water, we discussed about possibly using seltzer or almond milk.  She agrees to try these. -Today's visit, I suggested a CGM, but she declines due to the fear of the skin prick.  We discussed that she does not usually feel this when the sensor cannula goes in, but she would like to stay off for now.  She is usually inserting the sensor for her mother. - I  suggested to:  Patient Instructions  Please continue: - Metformin ER 1500 mg daily  Please increase: - Lantus 36 units daily - Humalog 10-14 units before the main meals  STOP  SODAS!  Please return in 3 months.  - we checked her HbA1c: 11.9% (still high, but improved by 2 percentages since last visit) - advised to check sugars at different times of the day - 4x a day, rotating check times - advised for yearly eye exams >> she is UTD and has an appointment coming up next month - she declined labs today but she will return for these. - return to clinic in 3 months  2. HL -Reviewed latest lipid panel from 10/2021: All fractions at goal: Lab Results  Component Value Date   CHOL 157 10/26/2021   HDL 81.60 10/26/2021   LDLCALC 55 10/26/2021   TRIG 102.0 10/26/2021   CHOLHDL 2 10/26/2021  -She is off statins due to possible future pregnancy.  Previously on Zocor. - she prefers to return for a lipid panel.  I advised her to come fasting.  Carlus Pavlov, MD PhD [facility] Endocrinology

## 2023-06-04 NOTE — Patient Instructions (Addendum)
Please continue: - Metformin ER 1500 mg daily  Please increase: - Lantus 36 units daily - Humalog 10-14 units before the main meals  STOP SODAS!  Please return in 3 months.

## 2023-07-23 DIAGNOSIS — F4321 Adjustment disorder with depressed mood: Secondary | ICD-10-CM | POA: Diagnosis not present

## 2023-08-04 ENCOUNTER — Other Ambulatory Visit: Payer: Self-pay | Admitting: Internal Medicine

## 2023-08-05 ENCOUNTER — Telehealth: Payer: Self-pay | Admitting: Internal Medicine

## 2023-08-05 DIAGNOSIS — E119 Type 2 diabetes mellitus without complications: Secondary | ICD-10-CM

## 2023-08-05 MED ORDER — LANTUS SOLOSTAR 100 UNIT/ML ~~LOC~~ SOPN: 36.0000 [IU] | PEN_INJECTOR | Freq: Every day | SUBCUTANEOUS | 1 refills | Status: DC

## 2023-08-05 NOTE — Telephone Encounter (Signed)
MEDICATION: Lantus SoloStar insulin glargine (LANTUS SOLOSTAR) 100 UNIT/ML Solostar Pen  PHARMACY:    [id number] - [place], [place] - [address] (Ph: [phone])  Order Details  HAS THE PATIENT CONTACTED THEIR PHARMACY?  Yes  IS THIS A 90 DAY SUPPLY : Yes  IS PATIENT OUT OF MEDICATION: Yes  IF NOT; HOW MUCH IS LEFT:   LAST APPOINTMENT DATE: @ 06/04/2023  NEXT APPOINTMENT DATE:@Visit  date not found  DO WE HAVE YOUR PERMISSION TO LEAVE A DETAILED MESSAGE?: Yes  OTHER COMMENTS: Patient has been out of medication for a week.   **Let patient know to contact pharmacy at the end of the day to make sure medication is ready. **  ** Please notify patient to allow 48-72 hours to process**  **Encourage patient to contact the pharmacy for refills or they can request refills through [facility]**

## 2023-08-05 NOTE — Telephone Encounter (Signed)
Requested Prescriptions   Signed Prescriptions Disp Refills   insulin glargine (LANTUS SOLOSTAR) 100 UNIT/ML Solostar Pen 45 mL 1    Sig: Inject 36 Units into the skin at bedtime.    Authorizing Provider: Carlus Pavlov    Ordering User: Pollie Meyer

## 2023-08-06 DIAGNOSIS — F4321 Adjustment disorder with depressed mood: Secondary | ICD-10-CM | POA: Diagnosis not present

## 2023-08-22 DIAGNOSIS — F4321 Adjustment disorder with depressed mood: Secondary | ICD-10-CM | POA: Diagnosis not present

## 2023-09-05 DIAGNOSIS — F4321 Adjustment disorder with depressed mood: Secondary | ICD-10-CM | POA: Diagnosis not present

## 2023-09-18 DIAGNOSIS — F4321 Adjustment disorder with depressed mood: Secondary | ICD-10-CM | POA: Diagnosis not present

## 2023-11-26 DIAGNOSIS — F4321 Adjustment disorder with depressed mood: Secondary | ICD-10-CM | POA: Diagnosis not present

## 2023-12-10 DIAGNOSIS — F4321 Adjustment disorder with depressed mood: Secondary | ICD-10-CM | POA: Diagnosis not present

## 2023-12-11 DIAGNOSIS — B3731 Acute candidiasis of vulva and vagina: Secondary | ICD-10-CM | POA: Diagnosis not present

## 2023-12-11 DIAGNOSIS — N764 Abscess of vulva: Secondary | ICD-10-CM | POA: Diagnosis not present

## 2023-12-11 DIAGNOSIS — L0390 Cellulitis, unspecified: Secondary | ICD-10-CM | POA: Diagnosis not present

## 2023-12-13 DIAGNOSIS — B3731 Acute candidiasis of vulva and vagina: Secondary | ICD-10-CM | POA: Diagnosis not present

## 2023-12-13 DIAGNOSIS — Z4801 Encounter for change or removal of surgical wound dressing: Secondary | ICD-10-CM | POA: Diagnosis not present

## 2023-12-13 DIAGNOSIS — N764 Abscess of vulva: Secondary | ICD-10-CM | POA: Diagnosis not present

## 2023-12-13 DIAGNOSIS — E1165 Type 2 diabetes mellitus with hyperglycemia: Secondary | ICD-10-CM | POA: Diagnosis not present

## 2023-12-15 DIAGNOSIS — R739 Hyperglycemia, unspecified: Secondary | ICD-10-CM | POA: Diagnosis not present

## 2023-12-15 DIAGNOSIS — Z4801 Encounter for change or removal of surgical wound dressing: Secondary | ICD-10-CM | POA: Diagnosis not present

## 2023-12-15 DIAGNOSIS — N764 Abscess of vulva: Secondary | ICD-10-CM | POA: Diagnosis not present

## 2023-12-15 DIAGNOSIS — L0390 Cellulitis, unspecified: Secondary | ICD-10-CM | POA: Diagnosis not present

## 2023-12-17 DIAGNOSIS — F4321 Adjustment disorder with depressed mood: Secondary | ICD-10-CM | POA: Diagnosis not present

## 2023-12-18 DIAGNOSIS — H5213 Myopia, bilateral: Secondary | ICD-10-CM | POA: Diagnosis not present

## 2023-12-31 DIAGNOSIS — F4321 Adjustment disorder with depressed mood: Secondary | ICD-10-CM | POA: Diagnosis not present

## 2024-01-13 DIAGNOSIS — F4321 Adjustment disorder with depressed mood: Secondary | ICD-10-CM | POA: Diagnosis not present

## 2024-01-19 DIAGNOSIS — F4321 Adjustment disorder with depressed mood: Secondary | ICD-10-CM | POA: Diagnosis not present

## 2024-02-02 DIAGNOSIS — F4321 Adjustment disorder with depressed mood: Secondary | ICD-10-CM | POA: Diagnosis not present

## 2024-02-12 DIAGNOSIS — F4321 Adjustment disorder with depressed mood: Secondary | ICD-10-CM | POA: Diagnosis not present

## 2024-02-23 DIAGNOSIS — F4321 Adjustment disorder with depressed mood: Secondary | ICD-10-CM | POA: Diagnosis not present

## 2024-03-04 DIAGNOSIS — F4321 Adjustment disorder with depressed mood: Secondary | ICD-10-CM | POA: Diagnosis not present

## 2024-03-05 ENCOUNTER — Other Ambulatory Visit: Payer: Self-pay

## 2024-03-05 ENCOUNTER — Encounter (HOSPITAL_COMMUNITY): Payer: Self-pay

## 2024-03-05 ENCOUNTER — Emergency Department (HOSPITAL_COMMUNITY)
Admission: EM | Admit: 2024-03-05 | Discharge: 2024-03-06 | Disposition: A | Discharge status: Home / Self Care | Attending: Emergency Medicine | Admitting: Emergency Medicine

## 2024-03-05 DIAGNOSIS — R739 Hyperglycemia, unspecified: Secondary | ICD-10-CM

## 2024-03-05 DIAGNOSIS — L0231 Cutaneous abscess of buttock: Secondary | ICD-10-CM | POA: Diagnosis not present

## 2024-03-05 DIAGNOSIS — R000 Tachycardia, unspecified: Secondary | ICD-10-CM | POA: Diagnosis not present

## 2024-03-05 DIAGNOSIS — Z7984 Long term (current) use of oral hypoglycemic drugs: Secondary | ICD-10-CM | POA: Diagnosis not present

## 2024-03-05 DIAGNOSIS — R457 State of emotional shock and stress, unspecified: Secondary | ICD-10-CM | POA: Diagnosis not present

## 2024-03-05 DIAGNOSIS — L304 Erythema intertrigo: Secondary | ICD-10-CM | POA: Diagnosis not present

## 2024-03-05 DIAGNOSIS — E1165 Type 2 diabetes mellitus with hyperglycemia: Secondary | ICD-10-CM | POA: Insufficient documentation

## 2024-03-05 DIAGNOSIS — R42 Dizziness and giddiness: Secondary | ICD-10-CM | POA: Diagnosis not present

## 2024-03-05 LAB — CBG MONITORING, ED
Glucose-Capillary: 575 mg/dL (ref 70–99)
Glucose-Capillary: 445 mg/dL — ABNORMAL HIGH (ref 70–99)

## 2024-03-05 LAB — CBC
HCT: 40.7 % (ref 36.0–46.0)
Hemoglobin: 13.4 g/dL (ref 12.0–15.0)
MCH: 25.1 pg — ABNORMAL LOW (ref 26.0–34.0)
MCHC: 32.9 g/dL (ref 30.0–36.0)
MCV: 76.2 fL — ABNORMAL LOW (ref 80.0–100.0)
Platelets: 321 10*3/uL (ref 150–400)
RBC: 5.34 MIL/uL — ABNORMAL HIGH (ref 3.87–5.11)
RDW: 13.3 % (ref 11.5–15.5)
WBC: 7.5 10*3/uL (ref 4.0–10.5)
nRBC: 0 % (ref 0.0–0.2)

## 2024-03-05 LAB — COMPREHENSIVE METABOLIC PANEL WITH GFR
ALT: 17 U/L (ref 0–44)
AST: 18 U/L (ref 15–41)
Albumin: 3.4 g/dL — ABNORMAL LOW (ref 3.5–5.0)
Alkaline Phosphatase: 136 U/L — ABNORMAL HIGH (ref 38–126)
Anion gap: 10 (ref 5–15)
BUN: 8 mg/dL (ref 6–20)
CO2: 21 mmol/L — ABNORMAL LOW (ref 22–32)
Calcium: 8.5 mg/dL — ABNORMAL LOW (ref 8.9–10.3)
Chloride: 98 mmol/L (ref 98–111)
Creatinine, Ser: 1.01 mg/dL — ABNORMAL HIGH (ref 0.44–1.00)
GFR, Estimated: >60 mL/min (ref >60)
Glucose, Bld: 555 mg/dL (ref 70–99)
Potassium: 3.9 mmol/L (ref 3.5–5.1)
Sodium: 129 mmol/L — ABNORMAL LOW (ref 135–145)
Total Bilirubin: 0.9 mg/dL (ref 0.0–1.2)
Total Protein: 7.1 g/dL (ref 6.5–8.1)

## 2024-03-05 LAB — BLOOD GAS, VENOUS
Acid-base deficit: 3.3 mmol/L — ABNORMAL HIGH (ref 0.0–2.0)
Bicarbonate: 21.3 mmol/L (ref 20.0–28.0)
O2 Saturation: 50.6 %
Patient temperature: 37
pCO2, Ven: 36 mmHg — ABNORMAL LOW (ref 44–60)
pH, Ven: 7.38 (ref 7.25–7.43)
pO2, Ven: <31 mmHg — CL (ref 32–45)

## 2024-03-05 LAB — HCG, SERUM, QUALITATIVE: Preg, Serum: NEGATIVE

## 2024-03-05 LAB — BETA-HYDROXYBUTYRIC ACID: Beta-Hydroxybutyric Acid: 0.95 mmol/L — ABNORMAL HIGH (ref 0.05–0.27)

## 2024-03-05 LAB — I-STAT CG4 LACTIC ACID, ED: Lactic Acid, Venous: 1.5 mmol/L (ref 0.5–1.9)

## 2024-03-05 MED ORDER — INSULIN ASPART PROT & ASPART (70-30 MIX) 100 UNIT/ML ~~LOC~~ SUSP
10.0000 [IU] | Freq: Once | SUBCUTANEOUS | Status: AC
  Administered 2024-03-05: 10 [IU] via SUBCUTANEOUS
  Filled 2024-03-05: qty 10

## 2024-03-05 MED ORDER — ACETAMINOPHEN 325 MG PO TABS
650.0000 mg | ORAL_TABLET | Freq: Once | ORAL | Status: AC | PRN
  Administered 2024-03-05: 650 mg via ORAL
  Filled 2024-03-05: qty 2

## 2024-03-05 MED ORDER — LACTATED RINGERS IV BOLUS
1000.0000 mL | Freq: Once | INTRAVENOUS | Status: AC
  Administered 2024-03-05: 1000 mL via INTRAVENOUS

## 2024-03-05 NOTE — ED Triage Notes (Signed)
 HR 160 initially for Ems. Down to 80-90s after panic attack subsided. 18G L AC. Received approx 300ml NaCl. Abscess reported on buttocks. On ATB for last few days.

## 2024-03-05 NOTE — Progress Notes (Signed)
 [address] - AMBULATORY [facility] [address] [place] [address]   Date of Service: 03/05/2024 Patient DOB: 23-Sep-1991    History of Present Illness   Patient ID: Denise [name] is a 33 y.o. female. Patient presents to urgent care due to possible abscess near rectum.  Patient states that she was at urgent care approximately 1 to 2 months ago for similar symptoms in which an abscess was drained and she was given antibiotics.  Patient states she has started to experience pain in similar area with associated skin irritation of gluteal cleft.  Patient states that she has been using topicals without improvement. Pt feels that her sx have also been exacerbated by significant intentional weight loss. Notes that sx seem to worsen with menstrual cycles. Denies fever, fatigue, body aches, chills, warmth, drainage, n/v/d, urinary sx.  Past Medical History:  Diagnosis Date  . Asthma (CMD)   . Diabetes mellitus    (CMD)     BP 117/87 (BP Location: Right arm, Patient Position: Sitting)   Pulse 100   Ht 1.651 m (5' 5)   Wt 88.9 kg (196 lb)   BMI 32.62 kg/m    Review of Systems   Review of Systems  Skin:        Possible abscess of left perineal region     Physicial Exam   Physical Exam Vitals reviewed.  Constitutional:      Appearance: Normal appearance. She is normal weight.  Skin:    Findings: Abscess present.          Comments: Pt does appear to have a 3 cm and a 4cm abscess of left gluteal cleft without surrounding warmth, erythema, or spontaneous drainage. Skin breakdown within gluteal cleft present.  Neurological:     Mental Status: She is alert.      Diagnosis   Valery Amedee was seen today for abscess.  Diagnoses and all orders for this visit:  Abscess, gluteal, left -     doxycycline (VIBRA-TABS) 100 mg tablet; Take 1 tablet (100 mg total) by mouth 2 (two) times a day for 10  days. Take with 8 oz water. Do not lie down for at least 30 minutes after. -     Ambulatory Referral to Dermatology; Future  Intertrigo -     nystatin (MYCOSTATIN) 100,000 unit/gram powder; Apply topically 2 (two) times a day for 14 days.     Medical Decision Making Denise [name] is a 33 y.o. female who presents to urgent care today with complaints of  Chief Complaint  Patient presents with  . Abscess    Patient states she has an abscess in her groin area and is requesting antibiotics for same. Patient reports pain with same.     On exam, VS stable and patient is afebrile. Appears well-hydrated and capillary refill <2 seconds.  Ddx: abscess, folliculitis, cellulitis  Patient presents to urgent care due to possible abscess. On exam, pt does appear to have two abscesses of left gluteal cleft without overlying cellulitis. Pt declined incision and draiange and would prefer oral ABX. I did discuss risks/benefits of procedure.Rx doxycycline. Referral to derm provided, given reoccurrence of sx. Rx nystatin powder what appears to be intertrigo of gluteal cleft. Recommend keeping area dry. I discussed with pt that she will need to return for I & D if sx do not improve/resolve, or develop systemic sx of infection; pt voiced agreement and  understanding.   Discharge Information  Symptomatic management discussed.  Patient was given verbal and written instructions on symptoms that necessitate return to the UC/ED, and instructed to f/u w/ UC or PCP if not improving in expected timeframe.   Patient/parent has been instructed on RX/OTC medications, dosages, side effects, and possible interactions as associated with each diagnosis in my impression and plan above.   Patient education (verbal/handout) given on diagnosis, pathophysiology, treatment of diagnosis, side effects of medication use for treatment, restrictions while taking medication, supportives measures such as staying hydrated.   Red Flags  associated with diagnosis/es were reviewed and patient instructed on action plan if red flags develop.   They have been instructed that if symptoms worsen or red flags develop they should return to Urgent Care, go to the nearest ED, or activate EMS/911.     Patient and/or parent/guardian (if applicable) agreed with plan and voiced understanding.  No barriers to adherence perceived by myself.   Portions of this note may have been dictated using Dragon dictation software/hardware and may contain grammatical or spelling errors.   Electronically signed by @ELECTRON[employer]SIG @ at 4:35 PM.  If a new prescription was given today, then I discussed potential side effects, drug interactions, instructions for taking the medication, and the consequences of not taking it.    F/u: Follow up closely with primary care provider (PCP) and other specialists for further care and routine care, but seek medical attention sooner if worsening/concerning signs or symptoms.  Routine Follow Up with Specialist   Electronically signed by: Darryle Slater Fish, PA-C 03/05/2024 4:35 PM

## 2024-03-05 NOTE — ED Triage Notes (Signed)
 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 Pt's provider just stopped practicing (pt not on anxiety meds and insulin  at this time as a result). Glucometer reading > 600. Pt on Metforming, noncompliant. Denies N/V/abdominal pain.

## 2024-03-05 NOTE — ED Provider Notes (Signed)
 [place] EMERGENCY DEPARTMENT AT [facility] Provider Note   CSN: [id number] Arrival date & time: 03/05/24  2149     History  Chief Complaint  Patient presents with   Hyperglycemia         Denise [name] is a 33 y.o. female.  Patient with past medical history significant for type II DM, reported anxiety presents to the emergency department via EMS due to a panic attack and hyperglycemia.  EMS reported that they were contacted due to the panic attack.  Initially patient's heart rate was approximately 160 which subsided to the 80s or 90s as they talk to the patient.  She was administered 300 mL of normal saline during transport.  The patient does endorse having an abscess in the perennial region and took her first dose of antibiotics prescribed by urgent care this afternoon.  EMS reports they also noted that she had a glucometer reading greater than 600.  The patient states that she has not been taking her insulin  for a long time due to not seeing her primary provider.  She does take metformin  but does not take it according to label and frequently takes lower doses due to eating less food during the day.  Patient currently denying nausea, vomiting, abdominal pain, chest pain, shortness of breath.   Hyperglycemia      Home Medications Prior to Admission medications   Medication Sig Start Date End Date Taking? Authorizing Provider  Blood Glucose Monitoring Suppl (ACCU-CHEK GUIDE) w/Device KIT Use as advised 06/04/23   Emilie Harden, MD  glucose blood (ACCU-CHEK GUIDE) test strip Use as instructed 3-4x a day 01/29/23   Emilie Harden, MD  insulin  glargine (LANTUS  SOLOSTAR) 100 UNIT/ML Solostar Pen Inject 36 Units into the skin at bedtime. 08/05/23   Emilie Harden, MD  insulin  lispro (HUMALOG  KWIKPEN) 100 UNIT/ML KwikPen Inject 10-14 Units into the skin 3 (three) times daily. 06/04/23   Emilie Harden, MD  Insulin  Pen Needle 32G X 4 MM MISC Use 4x a day 01/29/23    Emilie Harden, MD  metFORMIN  (GLUCOPHAGE -XR) 500 MG 24 hr tablet Take 3 tablets (1,500 mg total) by mouth daily. 01/29/23   Emilie Harden, MD  NIFEdipine  (PROCARDIA  XL) 30 MG 24 hr tablet Take 1 tablet (30 mg total) by mouth daily. 03/27/22   Loetta Ringer, CNM  progesterone  200 MG SUPP Place 200 mg vaginally at bedtime.    [provider]      Allergies    Farxiga  [dapagliflozin ]    Review of Systems   Review of Systems  Physical Exam Updated Vital Signs BP (!) 141/95   Pulse (!) 103   Temp (!) 101.2 F (38.4 C) (Oral)   Resp 18   Ht 5' 5.5" (1.664 m)   Wt 88 kg   SpO2 100%   BMI 31.79 kg/m  Physical Exam Vitals and nursing note reviewed. Exam conducted with a chaperone present.  Constitutional:      General: She is not in acute distress.    Appearance: She is well-developed.  HENT:     Head: Normocephalic and atraumatic.  Eyes:     Conjunctiva/sclera: Conjunctivae normal.  Cardiovascular:     Rate and Rhythm: Normal rate and regular rhythm.  Pulmonary:     Effort: Pulmonary effort is normal. No respiratory distress.     Breath sounds: Normal breath sounds.  Abdominal:     Palpations: Abdomen is soft.     Tenderness: There is no abdominal tenderness.  Musculoskeletal:        General: No swelling.     Cervical back: Neck supple.  Skin:    General: Skin is warm and dry.     Capillary Refill: Capillary refill takes less than 2 seconds.  Neurological:     Mental Status: She is alert.  Psychiatric:        Mood and Affect: Mood normal.     ED Results / Procedures / Treatments   Labs (all labs ordered are listed, but only abnormal results are displayed) Labs Reviewed  CBC - Abnormal; Notable for the following components:      Result Value   RBC 5.34 (*)    MCV 76.2 (*)    MCH 25.1 (*)    All other components within normal limits  URINALYSIS, ROUTINE W REFLEX MICROSCOPIC - Abnormal; Notable for the following components:   Color, Urine STRAW (*)     Specific Gravity, Urine 1.032 (*)    Glucose, UA >=500 (*)    Hgb urine dipstick LARGE (*)    Ketones, ur 80 (*)    Protein, ur 100 (*)    All other components within normal limits  BETA-HYDROXYBUTYRIC ACID - Abnormal; Notable for the following components:   Beta-Hydroxybutyric Acid 0.95 (*)    All other components within normal limits  BLOOD GAS, VENOUS - Abnormal; Notable for the following components:   pCO2, Ven 36 (*)    pO2, Ven <31 (*)    Acid-base deficit 3.3 (*)    All other components within normal limits  COMPREHENSIVE METABOLIC PANEL WITH GFR - Abnormal; Notable for the following components:   Sodium 129 (*)    CO2 21 (*)    Glucose, Bld 555 (*)    Creatinine, Ser 1.01 (*)    Calcium  8.5 (*)    Albumin 3.4 (*)    Alkaline Phosphatase 136 (*)    All other components within normal limits  CBG MONITORING, ED - Abnormal; Notable for the following components:   Glucose-Capillary 575 (*)    All other components within normal limits  CBG MONITORING, ED - Abnormal; Notable for the following components:   Glucose-Capillary 445 (*)    All other components within normal limits  CBG MONITORING, ED - Abnormal; Notable for the following components:   Glucose-Capillary 325 (*)    All other components within normal limits  CULTURE, BLOOD (ROUTINE X 2)  CULTURE, BLOOD (ROUTINE X 2)  HCG, SERUM, QUALITATIVE  I-STAT CG4 LACTIC ACID, ED  I-STAT CG4 LACTIC ACID, ED    EKG None  Radiology No results found.  Procedures Procedures    Medications Ordered in ED Medications  acetaminophen  (TYLENOL ) tablet 650 mg (650 mg Oral Given 03/05/24 2312)  lactated ringers  bolus 1,000 mL (1,000 mLs Intravenous New Bag/Given 03/05/24 2312)  insulin  aspart protamine- aspart (NOVOLOG  MIX 70/30) injection 10 Units (10 Units Subcutaneous Given 03/05/24 2333)    ED Course/ Medical Decision Making/ A&P                                 Medical Decision Making Amount and/or Complexity of  Data Reviewed Labs: ordered.  Risk OTC drugs. Prescription drug management.   This patient presents to the ED for concern of hyperglycemia, this involves an extensive number of treatment options, and is a complaint that carries with it a high risk of complications and morbidity.  The differential diagnosis includes hyperglycemia,  DKA, HHS, others   Co morbidities that complicate the patient evaluation  Noncompliance with medication regimen, type II DM   Additional history obtained:  Additional history obtained from EMS External records from outside source obtained and reviewed including endocrinology notes from March 2024.  Notes showing poor compliance with medication.  Patient was supposed to be taking metformin  500 mg 3 times a day but had run out at the time of that appointment, was supposed to take glimepiride  before breakfast but ran out 2 months earlier, was supposed to take glipizide  twice a day but was inconsistent with that, and was taking an inconsistent amount of Lantus    Lab Tests:  I Ordered, and personally interpreted labs.  The pertinent results include: pH 7.38, initial glucose 555 (most recent CBG 325), UA with no bacteria, leukocyte negative, nitrite negative (no sign of infection), no leukocytosis   Problem List / ED Course / Critical interventions / Medication management   I ordered medication including LR bolus, insulin  for hyperglycemia Reevaluation of the patient after these medicines showed that the patient improved I have reviewed the patients home medicines and have made adjustments as needed   Social Determinants of Health:  Patient has Medicaid for her primary health insurance type   Test / Admission - Considered:  Patient does have a mildly elevated beta hydroxybutyric acid at 0.95 but she is not acidotic and has no anion gap.  She does not appear to be in DKA.  Patient is grossly noncompliant with majority of diabetic medications.  Her most  recent A1c was 14.1.  With blood glucose improved to 325 patient does appear stable at this time for discharge home.  She does have a known abscess in the gluteal region which is being treated with doxycycline at this time.  She has only had 1 dose of medication and has no interest at all in having the wound incised and drained.  I strongly recommended patient that she continue the full course of antibiotics and follow-up with urgent care as needed for further abscess management.  Patient does appear stable at this time for discharge home.  I have also explained to the patient the importance of follow-up with endocrinology or family medicine, some provider for diabetic management.  I explained some of the risks of uncontrolled diabetes to the patient.  The patient states she intends to resume seeing endocrinology for further management.  I see no indication for admission at this time.  Plan to discharge home with return precautions.         Final Clinical Impression(s) / ED Diagnoses Final diagnoses:  Hyperglycemia    Rx / DC Orders ED Discharge Orders     None         Delories Fetter 03/06/24 0106    Lowery Rue, DO 03/06/24 1450

## 2024-03-05 NOTE — ED Provider Notes (Incomplete)
 [place] EMERGENCY DEPARTMENT AT [facility] Provider Note   CSN: [id number] Arrival date & time: 03/05/24  2149     History {Add pertinent medical, surgical, social history, OB history to HPI:1} Chief Complaint  Patient presents with  . Hyperglycemia         Denise [name] is a 33 y.o. female.  Patient with past medical history significant for type II DM, reported anxiety presents to the emergency department via EMS due to a panic attack and hyperglycemia.  EMS reported that they were contacted due to the panic attack.  Initially patient's heart rate was approximately 160 which subsided to the 80s or 90s as they talk to the patient.  She was administered 300 mL of normal saline during transport.  The patient does endorse having an abscess in the perennial region and took her first dose of antibiotics prescribed by urgent care this afternoon.  EMS reports they also noted that she had a glucometer reading greater than 600.  The patient states that she has not been taking her insulin  for a long time due to not seeing her primary provider.  She does take metformin  but does not take it according to label and frequently takes lower doses due to eating less food during the day.  Patient currently denying nausea, vomiting, abdominal pain, chest pain, shortness of breath.   Hyperglycemia      Home Medications Prior to Admission medications   Medication Sig Start Date End Date Taking? Authorizing Provider  Blood Glucose Monitoring Suppl (ACCU-CHEK GUIDE) w/Device KIT Use as advised 06/04/23   Emilie Harden, MD  glucose blood (ACCU-CHEK GUIDE) test strip Use as instructed 3-4x a day 01/29/23   Emilie Harden, MD  insulin  glargine (LANTUS  SOLOSTAR) 100 UNIT/ML Solostar Pen Inject 36 Units into the skin at bedtime. 08/05/23   Emilie Harden, MD  insulin  lispro (HUMALOG  KWIKPEN) 100 UNIT/ML KwikPen Inject 10-14 Units into the skin 3 (three) times daily. 06/04/23   Emilie Harden, MD  Insulin  Pen Needle 32G X 4 MM MISC Use 4x a day 01/29/23   Emilie Harden, MD  metFORMIN  (GLUCOPHAGE -XR) 500 MG 24 hr tablet Take 3 tablets (1,500 mg total) by mouth daily. 01/29/23   Emilie Harden, MD  NIFEdipine  (PROCARDIA  XL) 30 MG 24 hr tablet Take 1 tablet (30 mg total) by mouth daily. 03/27/22   Loetta Ringer, CNM  progesterone  200 MG SUPP Place 200 mg vaginally at bedtime.    [provider]      Allergies    Farxiga  [dapagliflozin ]    Review of Systems   Review of Systems  Physical Exam Updated Vital Signs BP (!) 137/96 (BP Location: Right Arm)   Pulse (!) 106   Temp (!) 101.2 F (38.4 C) (Oral)   Resp 18   Ht 5' 5.5" (1.664 m)   Wt 88 kg   SpO2 100%   BMI 31.79 kg/m  Physical Exam Vitals and nursing note reviewed. Exam conducted with a chaperone present.  Constitutional:      General: She is not in acute distress.    Appearance: She is well-developed.  HENT:     Head: Normocephalic and atraumatic.  Eyes:     Conjunctiva/sclera: Conjunctivae normal.  Cardiovascular:     Rate and Rhythm: Normal rate and regular rhythm.  Pulmonary:     Effort: Pulmonary effort is normal. No respiratory distress.     Breath sounds: Normal breath sounds.  Abdominal:     Palpations: Abdomen  is soft.     Tenderness: There is no abdominal tenderness.  Musculoskeletal:        General: No swelling.     Cervical back: Neck supple.  Skin:    General: Skin is warm and dry.     Capillary Refill: Capillary refill takes less than 2 seconds.  Neurological:     Mental Status: She is alert.  Psychiatric:        Mood and Affect: Mood normal.     ED Results / Procedures / Treatments   Labs (all labs ordered are listed, but only abnormal results are displayed) Labs Reviewed  CBC - Abnormal; Notable for the following components:      Result Value   RBC 5.34 (*)    MCV 76.2 (*)    MCH 25.1 (*)    All other components within normal limits  BLOOD GAS, VENOUS  - Abnormal; Notable for the following components:   pCO2, Ven 36 (*)    pO2, Ven <31 (*)    Acid-base deficit 3.3 (*)    All other components within normal limits  CBG MONITORING, ED - Abnormal; Notable for the following components:   Glucose-Capillary 575 (*)    All other components within normal limits  CULTURE, BLOOD (ROUTINE X 2)  CULTURE, BLOOD (ROUTINE X 2)  URINALYSIS, ROUTINE W REFLEX MICROSCOPIC  HCG, SERUM, QUALITATIVE  BETA-HYDROXYBUTYRIC ACID  COMPREHENSIVE METABOLIC PANEL WITH GFR  I-STAT CG4 LACTIC ACID, ED    EKG None  Radiology No results found.  Procedures Procedures  {Document cardiac monitor, telemetry assessment procedure when appropriate:1}  Medications Ordered in ED Medications  acetaminophen  (TYLENOL ) tablet 650 mg (has no administration in time range)    ED Course/ Medical Decision Making/ A&P   {   Click here for ABCD2, HEART and other calculatorsREFRESH Note before signing :1}                              Medical Decision Making Amount and/or Complexity of Data Reviewed Labs: ordered.  Risk OTC drugs.   This patient presents to the ED for concern of hyperglycemia, this involves an extensive number of treatment options, and is a complaint that carries with it a high risk of complications and morbidity.  The differential diagnosis includes hyperglycemia, DKA, HHS, others   Co morbidities that complicate the patient evaluation  Noncompliance with medication regimen, type II DM   Additional history obtained:  Additional history obtained from EMS External records from outside source obtained and reviewed including endocrinology notes from March 2024.  Notes showing poor compliance with medication.  Patient was supposed to be taking metformin  500 mg 3 times a day but had run out at the time of that appointment, was supposed to take glimepiride  before breakfast but ran out 2 months earlier, was supposed to take glipizide  twice a day but was  inconsistent with that, and was taking an inconsistent amount of Lantus    Lab Tests:  I Ordered, and personally interpreted labs.  The pertinent results include:  ***   Imaging Studies ordered:  I ordered imaging studies including ***  I independently visualized and interpreted imaging which showed *** I agree with the radiologist interpretation   Cardiac Monitoring: / EKG:  The patient was maintained on a cardiac monitor.  I personally viewed and interpreted the cardiac monitored which showed an underlying rhythm of: ***   Problem List / ED Course / Critical  interventions / Medication management  *** I ordered medication including ***  for ***  Reevaluation of the patient after these medicines showed that the patient {resolved/improved/worsened:23923::"improved"} I have reviewed the patients home medicines and have made adjustments as needed   Consultations Obtained:  I requested consultation with the ***,  and discussed lab and imaging findings as well as pertinent plan - they recommend: ***   Social Determinants of Health:  ***   Test / Admission - Considered:  ***   {Document critical care time when appropriate:1} {Document review of labs and clinical decision tools ie heart score, Chads2Vasc2 etc:1}  {Document your independent review of radiology images, and any outside records:1} {Document your discussion with family members, caretakers, and with consultants:1} {Document social determinants of health affecting pt's care:1} {Document your decision making why or why not admission, treatments were needed:1} Final Clinical Impression(s) / ED Diagnoses Final diagnoses:  None    Rx / DC Orders ED Discharge Orders     None

## 2024-03-06 LAB — URINALYSIS, ROUTINE W REFLEX MICROSCOPIC
Bacteria, UA: NONE SEEN
Bilirubin Urine: NEGATIVE
Glucose, UA: >=500 mg/dL — AB
Ketones, ur: 80 mg/dL — AB
Leukocytes,Ua: NEGATIVE
Nitrite: NEGATIVE
Protein, ur: 100 mg/dL — AB
RBC / HPF: >50 RBC/hpf (ref 0–5)
Specific Gravity, Urine: 1.032 — ABNORMAL HIGH (ref 1.005–1.030)
pH: 5 (ref 5.0–8.0)

## 2024-03-06 LAB — CBG MONITORING, ED: Glucose-Capillary: 325 mg/dL — ABNORMAL HIGH (ref 70–99)

## 2024-03-06 NOTE — Discharge Instructions (Signed)
 Please resume your diabetic medications at home.  It is important that you follow-up with your primary team for further management of your diabetes.  Continue to take the entire course of antibiotics prescribed earlier today by urgent care for your abscess.  If you develop signs of worsening infection seek reassessment for possible incision and drainage of the abscess.  If you develop any life-threatening symptoms return to the emergency department.

## 2024-03-08 ENCOUNTER — Telehealth: Payer: Self-pay | Admitting: Internal Medicine

## 2024-03-08 ENCOUNTER — Other Ambulatory Visit: Payer: Self-pay | Admitting: Internal Medicine

## 2024-03-08 DIAGNOSIS — E11319 Type 2 diabetes mellitus with unspecified diabetic retinopathy without macular edema: Secondary | ICD-10-CM

## 2024-03-08 DIAGNOSIS — E785 Hyperlipidemia, unspecified: Secondary | ICD-10-CM

## 2024-03-08 NOTE — Telephone Encounter (Signed)
 Patient is requesting a referral to :  [facility]  Address: [address] [place], [place], [place] [address] Phone: [phone]

## 2024-03-09 DIAGNOSIS — E1165 Type 2 diabetes mellitus with hyperglycemia: Secondary | ICD-10-CM | POA: Diagnosis not present

## 2024-03-09 DIAGNOSIS — F419 Anxiety disorder, unspecified: Secondary | ICD-10-CM | POA: Diagnosis not present

## 2024-03-09 DIAGNOSIS — Z794 Long term (current) use of insulin: Secondary | ICD-10-CM | POA: Diagnosis not present

## 2024-03-09 DIAGNOSIS — L0231 Cutaneous abscess of buttock: Secondary | ICD-10-CM | POA: Diagnosis not present

## 2024-03-10 DIAGNOSIS — F4321 Adjustment disorder with depressed mood: Secondary | ICD-10-CM | POA: Diagnosis not present

## 2024-03-10 LAB — CULTURE, BLOOD (ROUTINE X 2)
Culture: NO GROWTH
Culture: NO GROWTH
Special Requests: ADEQUATE

## 2024-03-11 DIAGNOSIS — R739 Hyperglycemia, unspecified: Secondary | ICD-10-CM | POA: Diagnosis not present

## 2024-03-11 DIAGNOSIS — L0291 Cutaneous abscess, unspecified: Secondary | ICD-10-CM | POA: Diagnosis not present

## 2024-03-15 DIAGNOSIS — F4321 Adjustment disorder with depressed mood: Secondary | ICD-10-CM | POA: Diagnosis not present

## 2024-04-10 ENCOUNTER — Other Ambulatory Visit: Payer: Self-pay | Admitting: Internal Medicine

## 2024-05-15 DIAGNOSIS — R079 Chest pain, unspecified: Secondary | ICD-10-CM | POA: Diagnosis not present

## 2024-06-28 DIAGNOSIS — O99341 Other mental disorders complicating pregnancy, first trimester: Secondary | ICD-10-CM | POA: Diagnosis not present

## 2024-06-28 DIAGNOSIS — Z794 Long term (current) use of insulin: Secondary | ICD-10-CM | POA: Diagnosis not present

## 2024-06-28 DIAGNOSIS — E1165 Type 2 diabetes mellitus with hyperglycemia: Secondary | ICD-10-CM | POA: Diagnosis not present

## 2024-06-28 DIAGNOSIS — Z3A01 Less than 8 weeks gestation of pregnancy: Secondary | ICD-10-CM | POA: Diagnosis not present

## 2024-06-28 DIAGNOSIS — Z7984 Long term (current) use of oral hypoglycemic drugs: Secondary | ICD-10-CM | POA: Diagnosis not present

## 2024-06-28 DIAGNOSIS — Z3201 Encounter for pregnancy test, result positive: Secondary | ICD-10-CM | POA: Diagnosis not present

## 2024-06-28 DIAGNOSIS — F419 Anxiety disorder, unspecified: Secondary | ICD-10-CM | POA: Diagnosis not present

## 2024-06-28 DIAGNOSIS — O24111 Pre-existing diabetes mellitus, type 2, in pregnancy, first trimester: Secondary | ICD-10-CM | POA: Diagnosis not present

## 2024-06-28 DIAGNOSIS — N926 Irregular menstruation, unspecified: Secondary | ICD-10-CM | POA: Diagnosis not present

## 2024-06-28 LAB — HEMOGLOBIN A1C: A1c: 14.1

## 2024-07-02 DIAGNOSIS — R42 Dizziness and giddiness: Secondary | ICD-10-CM | POA: Diagnosis not present

## 2024-07-02 DIAGNOSIS — R739 Hyperglycemia, unspecified: Secondary | ICD-10-CM | POA: Diagnosis not present

## 2024-07-06 ENCOUNTER — Encounter (HOSPITAL_COMMUNITY): Payer: Self-pay | Admitting: Obstetrics and Gynecology

## 2024-07-06 ENCOUNTER — Other Ambulatory Visit: Payer: Self-pay

## 2024-07-06 ENCOUNTER — Inpatient Hospital Stay (HOSPITAL_COMMUNITY)

## 2024-07-06 ENCOUNTER — Encounter: Payer: Self-pay | Admitting: Certified Nurse Midwife

## 2024-07-06 ENCOUNTER — Inpatient Hospital Stay (HOSPITAL_COMMUNITY)
Admission: AD | Admit: 2024-07-06 | Discharge: 2024-07-06 | Disposition: A | Discharge status: Home / Self Care | Attending: Obstetrics and Gynecology | Admitting: Obstetrics and Gynecology

## 2024-07-06 DIAGNOSIS — O98811 Other maternal infectious and parasitic diseases complicating pregnancy, first trimester: Secondary | ICD-10-CM | POA: Diagnosis not present

## 2024-07-06 DIAGNOSIS — Z3A00 Weeks of gestation of pregnancy not specified: Secondary | ICD-10-CM | POA: Diagnosis not present

## 2024-07-06 DIAGNOSIS — Z87898 Personal history of other specified conditions: Secondary | ICD-10-CM | POA: Insufficient documentation

## 2024-07-06 DIAGNOSIS — O30043 Twin pregnancy, dichorionic/diamniotic, third trimester: Secondary | ICD-10-CM | POA: Insufficient documentation

## 2024-07-06 DIAGNOSIS — R109 Unspecified abdominal pain: Secondary | ICD-10-CM

## 2024-07-06 DIAGNOSIS — O26891 Other specified pregnancy related conditions, first trimester: Secondary | ICD-10-CM

## 2024-07-06 DIAGNOSIS — B3731 Acute candidiasis of vulva and vagina: Secondary | ICD-10-CM | POA: Insufficient documentation

## 2024-07-06 DIAGNOSIS — O24111 Pre-existing diabetes mellitus, type 2, in pregnancy, first trimester: Secondary | ICD-10-CM | POA: Diagnosis not present

## 2024-07-06 DIAGNOSIS — Z794 Long term (current) use of insulin: Secondary | ICD-10-CM | POA: Diagnosis not present

## 2024-07-06 DIAGNOSIS — Z7984 Long term (current) use of oral hypoglycemic drugs: Secondary | ICD-10-CM | POA: Insufficient documentation

## 2024-07-06 DIAGNOSIS — O30041 Twin pregnancy, dichorionic/diamniotic, first trimester: Secondary | ICD-10-CM

## 2024-07-06 DIAGNOSIS — R102 Pelvic and perineal pain: Secondary | ICD-10-CM | POA: Diagnosis not present

## 2024-07-06 DIAGNOSIS — Z3201 Encounter for pregnancy test, result positive: Secondary | ICD-10-CM | POA: Insufficient documentation

## 2024-07-06 DIAGNOSIS — F419 Anxiety disorder, unspecified: Secondary | ICD-10-CM | POA: Insufficient documentation

## 2024-07-06 DIAGNOSIS — Z113 Encounter for screening for infections with a predominantly sexual mode of transmission: Secondary | ICD-10-CM | POA: Diagnosis present

## 2024-07-06 DIAGNOSIS — Z3A09 9 weeks gestation of pregnancy: Secondary | ICD-10-CM

## 2024-07-06 DIAGNOSIS — B379 Candidiasis, unspecified: Secondary | ICD-10-CM

## 2024-07-06 LAB — CBC
HCT: 41.3 % (ref 36.0–46.0)
Hemoglobin: 13.8 g/dL (ref 12.0–15.0)
MCH: 25.6 pg — ABNORMAL LOW (ref 26.0–34.0)
MCHC: 33.4 g/dL (ref 30.0–36.0)
MCV: 76.5 fL — ABNORMAL LOW (ref 80.0–100.0)
Platelets: 315 K/uL (ref 150–400)
RBC: 5.4 MIL/uL — ABNORMAL HIGH (ref 3.87–5.11)
RDW: 13.9 % (ref 11.5–15.5)
WBC: 4.3 K/uL (ref 4.0–10.5)
nRBC: 0 % (ref 0.0–0.2)

## 2024-07-06 LAB — URINALYSIS, ROUTINE W REFLEX MICROSCOPIC
Bacteria, UA: NONE SEEN
Bilirubin Urine: NEGATIVE
Glucose, UA: >=500 mg/dL — AB
Hgb urine dipstick: NEGATIVE
Ketones, ur: 5 mg/dL — AB
Leukocytes,Ua: NEGATIVE
Nitrite: NEGATIVE
Protein, ur: 30 mg/dL — AB
Specific Gravity, Urine: 1.035 — ABNORMAL HIGH (ref 1.005–1.030)
pH: 6 (ref 5.0–8.0)

## 2024-07-06 LAB — ABO/RH: ABO/RH(D): B POS

## 2024-07-06 LAB — WET PREP, GENITAL
Clue Cells Wet Prep HPF POC: NONE SEEN
Sperm: NONE SEEN
Trich, Wet Prep: NONE SEEN
WBC, Wet Prep HPF POC: <10 (ref <10)

## 2024-07-06 LAB — HCG, QUANTITATIVE, PREGNANCY: hCG, Beta Chain, Quant, S: 24156 m[IU]/mL — ABNORMAL HIGH (ref <5)

## 2024-07-06 LAB — POCT PREGNANCY, URINE: Preg Test, Ur: POSITIVE — AB

## 2024-07-06 MED ORDER — TERCONAZOLE 0.4 % VA CREA: 1.0000 | TOPICAL_CREAM | Freq: Every day | VAGINAL | 0 refills | Status: DC

## 2024-07-06 NOTE — MAU Provider Note (Signed)
 History    No chief complaint on file.  Denise [name] , a  33 y.o. [phone] at [redacted]w[redacted]d presents to MAU with complaints of lower abdominal cramping. Patient states that she has had on-going lower abdominal pain for the past few weeks. She reports a light spotting/period at the beginning of July, but denies any other abnormal bleeding or discharge. She currently rates pain as a 2/10. Denies urinary symptoms.          OB History     Gravida  4   Para  2   Term  1   Preterm  1   AB      Living  2      SAB      IAB      Ectopic      Multiple  0   Live Births  2           Past Medical History:  Diagnosis Date   Allergy    Arthritis    Asthma    Gestational diabetes    Hx of chlamydia infection 2017   Pregnancy induced hypertension    Pre-E   Seasonal allergies    Trichomonas 2017    Past Surgical History:  Procedure Laterality Date   CERVICAL CERCLAGE N/A 11/23/2018   Procedure: CERCLAGE CERVICAL;  Surgeon: Fredirick Glenys RAMAN, MD;  Location: [facility];  Service: Gynecology;  Laterality: N/A;    Family History  Problem Relation Age of Onset   Arthritis Mother    Diabetes Mother    Diabetes Maternal Grandmother    Arthritis Maternal Grandmother     Social History   Tobacco Use   Smoking status: Never   Smokeless tobacco: Never  Vaping Use   Vaping status: Never Used  Substance Use Topics   Alcohol use: Not Currently   Drug use: No    Allergies:  Allergies  Allergen Reactions   Farxiga  [Dapagliflozin ] Nausea And Vomiting    No medications prior to admission.    Review of Systems  Constitutional:  Negative for chills, fatigue and fever.  Eyes:  Negative for pain and visual disturbance.  Respiratory:  Negative for apnea, shortness of breath and wheezing.   Cardiovascular:  Negative for chest pain and palpitations.  Gastrointestinal:  Negative for abdominal pain, constipation, diarrhea, nausea and vomiting.  Genitourinary:   Positive for pelvic pain. Negative for difficulty urinating, dysuria, vaginal bleeding, vaginal discharge and vaginal pain.  Musculoskeletal:  Negative for back pain.  Neurological:  Negative for seizures, weakness and headaches.  Psychiatric/Behavioral:  Negative for suicidal ideas.    Physical Exam Last menstrual period 05/01/2024, unknown if currently breastfeeding. Physical Exam Vitals and nursing note reviewed.  Constitutional:      General: She is not in acute distress.    Appearance: Normal appearance.  HENT:     Head: Normocephalic.  Pulmonary:     Effort: Pulmonary effort is normal.  Musculoskeletal:     Cervical back: Normal range of motion.  Skin:    General: Skin is warm and dry.  Neurological:     Mental Status: She is alert and oriented to person, place, and time.  Psychiatric:        Mood and Affect: Mood normal.     MAU Course Orders Placed This Encounter  Procedures   Wet prep, genital   US  OB LESS THAN 14 WEEKS WITH OB TRANSVAGINAL   US  OB Comp AddL Gest Less 14 Wks   Urinalysis,  Routine w reflex microscopic -Urine, Clean Catch   CBC   hCG, quantitative, pregnancy   Diet NPO time specified   Pregnancy, urine POC   ABO/Rh   Discharge patient Discharge disposition: 01-Home or Self Care; Discharge patient date: 07/06/2024   Results for orders placed or performed during the hospital encounter of 07/06/24 (from the past 24 hours)  Urinalysis, Routine w reflex microscopic -Urine, Clean Catch     Status: Abnormal   Collection Time: 07/06/24  9:26 AM  Result Value Ref Range   Color, Urine YELLOW YELLOW   APPearance CLEAR CLEAR   Specific Gravity, Urine 1.035 (H) 1.005 - 1.030   pH 6.0 5.0 - 8.0   Glucose, UA >=500 (A) NEGATIVE mg/dL   Hgb urine dipstick NEGATIVE NEGATIVE   Bilirubin Urine NEGATIVE NEGATIVE   Ketones, ur 5 (A) NEGATIVE mg/dL   Protein, ur 30 (A) NEGATIVE mg/dL   Nitrite NEGATIVE NEGATIVE   Leukocytes,Ua NEGATIVE NEGATIVE   RBC / HPF 0-5  0 - 5 RBC/hpf   WBC, UA 0-5 0 - 5 WBC/hpf   Bacteria, UA NONE SEEN NONE SEEN   Squamous Epithelial / HPF 0-5 0 - 5 /HPF  Pregnancy, urine POC     Status: Abnormal   Collection Time: 07/06/24  9:31 AM  Result Value Ref Range   Preg Test, Ur POSITIVE (A) NEGATIVE  Wet prep, genital     Status: Abnormal   Collection Time: 07/06/24  9:38 AM   Specimen: PATH Cytology Cervicovaginal Ancillary Only  Result Value Ref Range   Yeast Wet Prep HPF POC PRESENT (A) NONE SEEN   Trich, Wet Prep NONE SEEN NONE SEEN   Clue Cells Wet Prep HPF POC NONE SEEN NONE SEEN   WBC, Wet Prep HPF POC <10 <10   Sperm NONE SEEN   ABO/Rh     Status: None   Collection Time: 07/06/24 12:02 PM  Result Value Ref Range   ABO/RH(D) B POS    No rh immune globuloin      NOT A RH IMMUNE GLOBULIN CANDIDATE, PT RH POSITIVE Performed at [facility], [address] [place], [place] [address]   CBC     Status: Abnormal   Collection Time: 07/06/24 12:03 PM  Result Value Ref Range   WBC 4.3 4.0 - 10.5 K/uL   RBC 5.40 (H) 3.87 - 5.11 MIL/uL   Hemoglobin 13.8 12.0 - 15.0 g/dL   HCT 58.6 63.9 - 53.9 %   MCV 76.5 (L) 80.0 - 100.0 fL   MCH 25.6 (L) 26.0 - 34.0 pg   MCHC 33.4 30.0 - 36.0 g/dL   RDW 86.0 88.4 - 84.4 %   Platelets 315 150 - 400 K/uL   nRBC 0.0 0.0 - 0.2 %  hCG, quantitative, pregnancy     Status: Abnormal   Collection Time: 07/06/24 12:03 PM  Result Value Ref Range   hCG, Beta Chain, Quant, S 24,156 (H) <5 mIU/mL   US  OB LESS THAN 14 WEEKS WITH OB TRANSVAGINAL Result Date: 07/06/2024 CLINICAL DATA:  Cramping and pain in 1st trimester pregnancy. EXAM: TWIN OBSTETRIC <14WK US  AND TRANSVAGINAL OB US  TECHNIQUE: Both transabdominal and transvaginal ultrasound examinations were performed for complete evaluation of the gestation as well as the maternal uterus, adnexal regions, and pelvic cul-de-sac. Transvaginal technique was performed to assess early pregnancy. COMPARISON:  None Available. FINDINGS: Number of  IUPs:  2 Chorionicity/Amnionicity:  Dichorionic-diamniotic (thick membrane) TWIN 1 Yolk sac:  Visualized. Embryo:  Not Visualized. MSD: 11 mm   5 w   6 d TWIN 2 Yolk sac:  Visualized. Embryo:  Not Visualized. MSD: 10 mm   5 w   5 d Subchorionic hemorrhage:  None visualized. Maternal uterus/adnexae: Right ovary is normal in appearance. Left ovary is not directly visualized, however, no mass or abnormal free fluid identified. IMPRESSION: Dichorionic twin IUP measuring 5 weeks 6 days by mean sac diameter. Suggest followup ultrasound to assess viability in 10 days. Electronically Signed   By: Norleen DELENA Kil M.D.   On: 07/06/2024 12:18   US  OB Comp AddL Gest Less 14 Wks Result Date: 07/06/2024 CLINICAL DATA:  Cramping and pain in 1st trimester pregnancy. EXAM: TWIN OBSTETRIC <14WK US  AND TRANSVAGINAL OB US  TECHNIQUE: Both transabdominal and transvaginal ultrasound examinations were performed for complete evaluation of the gestation as well as the maternal uterus, adnexal regions, and pelvic cul-de-sac. Transvaginal technique was performed to assess early pregnancy. COMPARISON:  None Available. FINDINGS: Number of IUPs:  2 Chorionicity/Amnionicity:  Dichorionic-diamniotic (thick membrane) TWIN 1 Yolk sac:  Visualized. Embryo:  Not Visualized. MSD: 11 mm   5 w   6 d TWIN 2 Yolk sac:  Visualized. Embryo:  Not Visualized. MSD: 10 mm   5 w   5 d Subchorionic hemorrhage:  None visualized. Maternal uterus/adnexae: Right ovary is normal in appearance. Left ovary is not directly visualized, however, no mass or abnormal free fluid identified. IMPRESSION: Dichorionic twin IUP measuring 5 weeks 6 days by mean sac diameter. Suggest followup ultrasound to assess viability in 10 days. Electronically Signed   By: Norleen DELENA Kil M.D.   On: 07/06/2024 12:18    MDM - Wet prep notable for yeast.  - H&H normal. Pt hemodynamically stable.  - UA- Patient is a diabetic on metformin  lantus  and glyburide.  - US  results revealed 2 GS and 2  YS consistent with Di/Di pregnancy.  - plan for discharge.    1. Abdominal cramping   2. [redacted] weeks gestation of pregnancy   3. Dichorionic diamniotic twin pregnancy in first trimester   4. Yeast infection    - Reviewed that Vaginal infections can cause lower abdominal cramping and abnormal discharge  - Rx for Terazole sent to outpatient pharmacy.  -  Recommended to seek care at a facility of her choosing.  - Message sent to [facility] to get patient scheduled for a viability US  in 2-4 weeks.  - Recommended to stop Glyburide if making her sick - Patient discharged home in stable condition and may return to MAU as needed.   Henri Guedes Erven) Emilio, MSN, CNM  [facility]  07/06/2024 2:20 PM

## 2024-07-06 NOTE — MAU Note (Signed)
 Denise [name] is a 33 y.o. at Unknown here in MAU reporting: abd cramping that has been going on for weeks but got worse last night. Denies bleeding   LMP: 05/01/24- unsure Onset of complaint: last night Pain score: 6/10 Vitals:   07/06/24 1033  BP: (!) 142/92  Pulse: 74  Resp: 16  Temp: 98.6 F (37 C)  SpO2: 100%     FHT: na  Lab orders placed from triage: CNM

## 2024-07-07 LAB — GC/CHLAMYDIA PROBE AMP (~~LOC~~) NOT AT ARMC
Chlamydia: NEGATIVE
Comment: NEGATIVE
Comment: NORMAL
Neisseria Gonorrhea: NEGATIVE

## 2024-07-09 DIAGNOSIS — Z8759 Personal history of other complications of pregnancy, childbirth and the puerperium: Secondary | ICD-10-CM | POA: Diagnosis not present

## 2024-07-09 DIAGNOSIS — N926 Irregular menstruation, unspecified: Secondary | ICD-10-CM | POA: Diagnosis not present

## 2024-07-09 DIAGNOSIS — O24911 Unspecified diabetes mellitus in pregnancy, first trimester: Secondary | ICD-10-CM | POA: Diagnosis not present

## 2024-07-09 DIAGNOSIS — O09291 Supervision of pregnancy with other poor reproductive or obstetric history, first trimester: Secondary | ICD-10-CM | POA: Diagnosis not present

## 2024-07-09 DIAGNOSIS — Z8619 Personal history of other infectious and parasitic diseases: Secondary | ICD-10-CM | POA: Diagnosis not present

## 2024-07-09 DIAGNOSIS — Z8669 Personal history of other diseases of the nervous system and sense organs: Secondary | ICD-10-CM | POA: Diagnosis not present

## 2024-07-09 DIAGNOSIS — O30041 Twin pregnancy, dichorionic/diamniotic, first trimester: Secondary | ICD-10-CM | POA: Diagnosis not present

## 2024-07-09 DIAGNOSIS — Z8751 Personal history of pre-term labor: Secondary | ICD-10-CM | POA: Diagnosis not present

## 2024-07-09 DIAGNOSIS — Z9889 Other specified postprocedural states: Secondary | ICD-10-CM | POA: Diagnosis not present

## 2024-07-09 DIAGNOSIS — E559 Vitamin D deficiency, unspecified: Secondary | ICD-10-CM | POA: Diagnosis not present

## 2024-07-09 DIAGNOSIS — R7989 Other specified abnormal findings of blood chemistry: Secondary | ICD-10-CM | POA: Diagnosis not present

## 2024-07-09 DIAGNOSIS — F4310 Post-traumatic stress disorder, unspecified: Secondary | ICD-10-CM | POA: Diagnosis not present

## 2024-07-09 DIAGNOSIS — O10919 Unspecified pre-existing hypertension complicating pregnancy, unspecified trimester: Secondary | ICD-10-CM | POA: Diagnosis not present

## 2024-07-09 DIAGNOSIS — O0991 Supervision of high risk pregnancy, unspecified, first trimester: Secondary | ICD-10-CM | POA: Diagnosis not present

## 2024-07-09 DIAGNOSIS — Z1321 Encounter for screening for nutritional disorder: Secondary | ICD-10-CM | POA: Diagnosis not present

## 2024-07-11 DIAGNOSIS — E559 Vitamin D deficiency, unspecified: Secondary | ICD-10-CM | POA: Insufficient documentation

## 2024-07-11 DIAGNOSIS — Z8669 Personal history of other diseases of the nervous system and sense organs: Secondary | ICD-10-CM | POA: Insufficient documentation

## 2024-07-21 ENCOUNTER — Other Ambulatory Visit: Payer: Self-pay | Admitting: Certified Nurse Midwife

## 2024-07-21 ENCOUNTER — Ambulatory Visit (INDEPENDENT_AMBULATORY_CARE_PROVIDER_SITE_OTHER)

## 2024-07-21 ENCOUNTER — Other Ambulatory Visit: Payer: Self-pay

## 2024-07-21 DIAGNOSIS — R109 Unspecified abdominal pain: Secondary | ICD-10-CM

## 2024-07-21 DIAGNOSIS — O30041 Twin pregnancy, dichorionic/diamniotic, first trimester: Secondary | ICD-10-CM | POA: Diagnosis not present

## 2024-07-21 DIAGNOSIS — Z3A08 8 weeks gestation of pregnancy: Secondary | ICD-10-CM | POA: Diagnosis not present
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
## 2024-07-23 ENCOUNTER — Telehealth: Payer: Self-pay | Admitting: Lactation Services

## 2024-07-23 DIAGNOSIS — Z124 Encounter for screening for malignant neoplasm of cervix: Secondary | ICD-10-CM | POA: Diagnosis not present

## 2024-07-23 DIAGNOSIS — Z9889 Other specified postprocedural states: Secondary | ICD-10-CM | POA: Diagnosis not present

## 2024-07-23 DIAGNOSIS — O99210 Obesity complicating pregnancy, unspecified trimester: Secondary | ICD-10-CM | POA: Diagnosis not present

## 2024-07-23 DIAGNOSIS — O10919 Unspecified pre-existing hypertension complicating pregnancy, unspecified trimester: Secondary | ICD-10-CM | POA: Diagnosis not present

## 2024-07-23 DIAGNOSIS — Z113 Encounter for screening for infections with a predominantly sexual mode of transmission: Secondary | ICD-10-CM | POA: Diagnosis not present

## 2024-07-23 DIAGNOSIS — O24911 Unspecified diabetes mellitus in pregnancy, first trimester: Secondary | ICD-10-CM | POA: Diagnosis not present

## 2024-07-23 DIAGNOSIS — E1165 Type 2 diabetes mellitus with hyperglycemia: Secondary | ICD-10-CM | POA: Diagnosis not present

## 2024-07-23 DIAGNOSIS — O30041 Twin pregnancy, dichorionic/diamniotic, first trimester: Secondary | ICD-10-CM | POA: Diagnosis not present

## 2024-07-23 DIAGNOSIS — O0991 Supervision of high risk pregnancy, unspecified, first trimester: Secondary | ICD-10-CM | POA: Diagnosis not present

## 2024-07-23 DIAGNOSIS — Z8751 Personal history of pre-term labor: Secondary | ICD-10-CM | POA: Diagnosis not present

## 2024-07-23 DIAGNOSIS — Z1151 Encounter for screening for human papillomavirus (HPV): Secondary | ICD-10-CM | POA: Diagnosis not present

## 2024-07-23 NOTE — Telephone Encounter (Signed)
 Patient came to office seeking to speak with Cornell Finder, CNM. Office is closed for OB/GYN at this time and patient was informed by Cardiology front office staff.  She asked to speak with management, who are not in office currently. This RN happened to be walking by and asked her how I could help.   Patient reports she just found out one of her twins has passed. She is requesting an earlier appointment for follow. She requests that Cornell Finder, CNM reach out to her. She reports she is feeling very stressed right now.   She did request for her BP to be taken as she was told at the other place that her BP was high ( > 140) and needed BP meds. BP was taken and was 129/77 LA sitting with large cuff.   Will route to Cornell Finder, CNM for advisement.

## 2024-07-23 NOTE — Progress Notes (Signed)
 Patient is here for her initial prenatal visit.  Patient's last menstrual period was 05/25/2024.   [id number]   Today she reports intermittent nausea with occasional emesis.  She has the following concerns today:  Middle pelvic cramping    Is currently on progesterone  supplementation per PPVI protocol?: yes - 200 mg PO AND PV nightly..  Did NOT hold last dose. Lead screen: negative.   HIV consent signed without concern IPV book given  She is not doing accu checks. Glucosuria in office. Refused accu check She is refusing physical exam/swabs today.

## 2024-07-25 ENCOUNTER — Encounter: Payer: Self-pay | Admitting: Certified Nurse Midwife

## 2024-07-27 ENCOUNTER — Inpatient Hospital Stay (HOSPITAL_COMMUNITY)
Admission: AD | Admit: 2024-07-27 | Discharge: 2024-07-27 | Disposition: A | Discharge status: Home / Self Care | Attending: Obstetrics and Gynecology | Admitting: Obstetrics and Gynecology

## 2024-07-27 ENCOUNTER — Encounter (HOSPITAL_COMMUNITY): Payer: Self-pay | Admitting: Obstetrics and Gynecology

## 2024-07-27 ENCOUNTER — Inpatient Hospital Stay (HOSPITAL_COMMUNITY)

## 2024-07-27 DIAGNOSIS — O26891 Other specified pregnancy related conditions, first trimester: Secondary | ICD-10-CM | POA: Diagnosis not present

## 2024-07-27 DIAGNOSIS — O200 Threatened abortion: Secondary | ICD-10-CM | POA: Insufficient documentation

## 2024-07-27 DIAGNOSIS — O3121X2 Continuing pregnancy after intrauterine death of one fetus or more, first trimester, fetus 2: Secondary | ICD-10-CM | POA: Insufficient documentation

## 2024-07-27 DIAGNOSIS — O30042 Twin pregnancy, dichorionic/diamniotic, second trimester: Secondary | ICD-10-CM | POA: Insufficient documentation

## 2024-07-27 DIAGNOSIS — R1030 Lower abdominal pain, unspecified: Secondary | ICD-10-CM

## 2024-07-27 DIAGNOSIS — O208 Other hemorrhage in early pregnancy: Secondary | ICD-10-CM | POA: Diagnosis not present

## 2024-07-27 DIAGNOSIS — Z3A09 9 weeks gestation of pregnancy: Secondary | ICD-10-CM | POA: Diagnosis not present

## 2024-07-27 DIAGNOSIS — O26899 Other specified pregnancy related conditions, unspecified trimester: Secondary | ICD-10-CM

## 2024-07-27 DIAGNOSIS — R109 Unspecified abdominal pain: Secondary | ICD-10-CM | POA: Diagnosis present

## 2024-07-27 DIAGNOSIS — O30041 Twin pregnancy, dichorionic/diamniotic, first trimester: Secondary | ICD-10-CM | POA: Insufficient documentation

## 2024-07-27 DIAGNOSIS — Z3A08 8 weeks gestation of pregnancy: Secondary | ICD-10-CM | POA: Diagnosis not present

## 2024-07-27 NOTE — MAU Provider Note (Signed)
 History     CSN: [id number]  Arrival date and time: 07/27/24 0841   Event Date/Time   First Provider Initiated Contact with Patient 07/27/24 1000      Chief Complaint  Patient presents with   Abdominal Pain   HPI Denise [name] is a 33 y.o. year old [id number] female at [redacted]w[redacted]d weeks gestation who presents to MAU reporting she has a twin pregnancy that had viable fetuses on 9/3; dx'd in MAU. She was scheduled for an initial OB appointment with [facility] on 9/5 where she had another U/S. She states she was told by the provider there that twin A was no longer viable. She has an appointment scheduled to be seen on 9/17, but she can't wait that long for an answer. She states, I am stressed and I want to know what the plan is. She also reports lower abdominal cramping that radiates down into the LT leg. She denies VB. She plans receive to [facility] with [facility] Nicholaus Finder, CNM; next appt is 08/04/2024.   OB History     Gravida  4   Para  2   Term  1   Preterm  1   AB      Living  2      SAB      IAB      Ectopic      Multiple  0   Live Births  2           Past Medical History:  Diagnosis Date   Allergy    Arthritis    Asthma    Gestational diabetes    Hx of chlamydia infection 2017   Pregnancy induced hypertension    Pre-E   Seasonal allergies    Trichomonas 2017    Past Surgical History:  Procedure Laterality Date   CERVICAL CERCLAGE N/A 11/23/2018   Procedure: CERCLAGE CERVICAL;  Surgeon: Fredirick Glenys RAMAN, MD;  Location: [facility] BIRTHING SUITES;  Service: Gynecology;  Laterality: N/A;    Family History  Problem Relation Age of Onset   Arthritis Mother    Diabetes Mother    Diabetes Maternal Grandmother    Arthritis Maternal Grandmother     Social History   Tobacco Use   Smoking status: Never   Smokeless tobacco: Never  Vaping Use   Vaping status: Never Used  Substance Use Topics   Alcohol use: Not Currently   Drug use: No    Allergies:   Allergies  Allergen Reactions   Farxiga  [Dapagliflozin ] Nausea And Vomiting    Medications Prior to Admission  Medication Sig Dispense Refill Last Dose/Taking   Blood Glucose Monitoring Suppl (ACCU-CHEK GUIDE) w/Device KIT Use as advised 1 kit 0    glucose blood (ACCU-CHEK GUIDE) test strip Use as instructed 3-4x a day 300 each 3    insulin  glargine (LANTUS  SOLOSTAR) 100 UNIT/ML Solostar Pen Inject 36 Units into the skin at bedtime. 45 mL 1    insulin  lispro (HUMALOG  KWIKPEN) 100 UNIT/ML KwikPen Inject 10-14 Units into the skin 3 (three) times daily.      Insulin  Pen Needle 32G X 4 MM MISC Use 4x a day 300 each 3    metFORMIN  (GLUCOPHAGE -XR) 500 MG 24 hr tablet Take 3 tablets (1,500 mg total) by mouth daily. 270 tablet 3    NIFEdipine  (PROCARDIA  XL) 30 MG 24 hr tablet Take 1 tablet (30 mg total) by mouth daily. 30 tablet 3    progesterone  200 MG SUPP Place 200  mg vaginally at bedtime.      terconazole  (TERAZOL 7 ) 0.4 % vaginal cream Place 1 applicator vaginally at bedtime. Use for seven days 45 g 0     Review of Systems  Constitutional: Negative.   HENT: Negative.    Eyes: Negative.   Respiratory: Negative.    Cardiovascular: Negative.   Gastrointestinal: Negative.   Endocrine: Negative.   Genitourinary:  Positive for pelvic pain (cramping).  Musculoskeletal: Negative.   Skin: Negative.   Allergic/Immunologic: Negative.   Neurological: Negative.   Hematological: Negative.   Psychiatric/Behavioral:  The patient is nervous/anxious.    Physical Exam   Blood pressure 135/83, pulse 100, temperature 97.9 F (36.6 C), temperature source Oral, resp. rate 18, last menstrual period 05/25/2024, SpO2 100%, unknown if currently breastfeeding.  Physical Exam Vitals and nursing note reviewed.  Constitutional:      Appearance: Normal appearance. She is obese.  Cardiovascular:     Rate and Rhythm: Normal rate.  Pulmonary:     Effort: Pulmonary effort is normal.  Abdominal:      Palpations: Abdomen is soft.  Musculoskeletal:        General: Normal range of motion.  Skin:    General: Skin is warm and dry.  Neurological:     Mental Status: She is alert and oriented to person, place, and time.  Psychiatric:        Mood and Affect: Mood normal.        Behavior: Behavior normal.        Thought Content: Thought content normal.        Judgment: Judgment normal.     MAU Course  Procedures  MDM OB <14 wks +addl gestation U/S  US  OB LESS THAN 14 WEEKS WITH OB TRANSVAGINAL Result Date: 07/27/2024 CLINICAL DATA:  Cramping. EXAM: TWIN OBSTETRIC <14WK US  AND TRANSVAGINAL OB US  TECHNIQUE: Both transabdominal and transvaginal ultrasound examinations were performed for complete evaluation of the gestation as well as the maternal uterus, adnexal regions, and pelvic cul-de-sac. Transvaginal technique was performed to assess early pregnancy. COMPARISON:  None Available. FINDINGS: Number of IUPs:  2 Chorionicity/Amnionicity:  Dichorionic-diamniotic (thick membrane) TWIN 1 Yolk sac:  Visualized. Embryo:  Visualized. Cardiac Activity: Visualized. Heart Rate: 159 bpm CRL:  19.3 mm   8 w 3 d                  US  EDC: March 05, 2024 TWIN 2 Yolk sac:  Visualized. Embryo:  Visualized. Cardiac Activity: Not Visualized. Heart Rate: N/A bpm CRL:  17.9 mm   8 w 2 d                  US  EDC: March 06, 2024 Subchorionic hemorrhage:  Small (8.7 mm x 10.1 mm) Maternal uterus/adnexae: The right and left ovaries are not visualized. No pelvic free fluid is seen. IMPRESSION: 1. Dichorionic/diamniotic intrauterine twin pregnancy with a viable Twin 1 (at approximately 8 weeks and 3 days gestation by ultrasound evaluation) and findings consistent with fetal demise of Twin 2. This follows SRU consensus guidelines: Diagnostic Criteria for Nonviable Pregnancy Early in the First Trimester. [facility] [place] [facility] [phone]. 2. Small subchorionic hemorrhage. Electronically Signed   By: Suzen Dials M.D.   On:  07/27/2024 11:45   US  OB Comp Add'L Gest Less 14 Wks Result Date: 07/27/2024 CLINICAL DATA:  Cramping. EXAM: TWIN OBSTETRIC <14WK US  AND TRANSVAGINAL OB US  TECHNIQUE: Both transabdominal and transvaginal ultrasound examinations were performed for complete evaluation of the  gestation as well as the maternal uterus, adnexal regions, and pelvic cul-de-sac. Transvaginal technique was performed to assess early pregnancy. COMPARISON:  None Available. FINDINGS: Number of IUPs:  2 Chorionicity/Amnionicity:  Dichorionic-diamniotic (thick membrane) TWIN 1 Yolk sac:  Visualized. Embryo:  Visualized. Cardiac Activity: Visualized. Heart Rate: 159 bpm CRL:  19.3 mm   8 w 3 d                  US  EDC: March 05, 2024 TWIN 2 Yolk sac:  Visualized. Embryo:  Visualized. Cardiac Activity: Not Visualized. Heart Rate: N/A bpm CRL:  17.9 mm   8 w 2 d                  US  EDC: March 06, 2024 Subchorionic hemorrhage:  Small (8.7 mm x 10.1 mm) Maternal uterus/adnexae: The right and left ovaries are not visualized. No pelvic free fluid is seen. IMPRESSION: 1. Dichorionic/diamniotic intrauterine twin pregnancy with a viable Twin 1 (at approximately 8 weeks and 3 days gestation by ultrasound evaluation) and findings consistent with fetal demise of Twin 2. This follows SRU consensus guidelines: Diagnostic Criteria for Nonviable Pregnancy Early in the First Trimester. [facility] [place] [facility] [phone]. 2. Small subchorionic hemorrhage. Electronically Signed   By: Suzen Dials M.D.   On: 07/27/2024 11:45    *Consult with Dr. Zina @ 1200 - notified of patient's complaints, assessments, lab & U/S results, tx plan continue with [facility] as scheduled - ok to d/c home, agrees with plan, also give good reassurance that there is nothing we can do about the deceased fetus and the body will more than likely reabsorb it. MFM needs to be made aware of her situation, so they can monitor further.   Assessment and Plan  1. Pregnancy with abdominal cramping of  lower quadrant, antepartum (Primary) - Advised she can take Tylenol  1000 mg prn cramping. - Patient reports she doesn't take pills; except Lexapro because I have to - Advised to soak in tub of warm water  2. Dichorionic diamniotic twin pregnancy in first trimester - Di-Di twin gestation seen on U/S with demise of Twin 2  3. Threatened miscarriage in early pregnancy - Information provided on threatened miscarriage - Advised that there is a slight increase in the risk of miscarriage - Advised that  there is nothing we can do about the deceased fetus  - Advised that MFM will need to be made aware of the situation, so they can monitor further; more than likely at anatomy U/S - Advised that pregnancy can likely go on to term and the deceased twin can shrink and be reabsorbed at this early gestation   4. [redacted] weeks gestation of pregnancy   - Discharge home - Keep scheduled appt with Jamilla on 08/04/2024 - Message sent to [facility] - Patient verbalized an understanding of the plan of care and agrees.   Areal Cochrane, CNM 07/27/2024, 10:00 AM

## 2024-07-27 NOTE — MAU Note (Signed)
 Denise [name] is a 33 y.o. at [redacted]w[redacted]d here in MAU reporting: on 9/3  had twins with IUP x2.  Was told on 9/5 then one of the babies no longer has a heart beat. Doesn't have appt until the 17th.  Is stressed as she doesn't know what to expect  or what the plan is.   Woke up with cramping, radiating into left leg.  No bleeding. Onset of complaint: 9/5 Pain score: mild Vitals:   07/27/24 0936  BP: 135/83  Pulse: 100  Resp: 18  Temp: 97.9 F (36.6 C)  SpO2: 100%     Lab orders placed from triage:

## 2024-08-04 ENCOUNTER — Other Ambulatory Visit: Payer: Self-pay

## 2024-08-04 ENCOUNTER — Ambulatory Visit (INDEPENDENT_AMBULATORY_CARE_PROVIDER_SITE_OTHER): Admitting: Certified Nurse Midwife

## 2024-08-04 ENCOUNTER — Encounter: Payer: Self-pay | Admitting: Certified Nurse Midwife

## 2024-08-04 VITALS — BP 125/73 | HR 66 | Wt 218.6 lb

## 2024-08-04 DIAGNOSIS — Z3491 Encounter for supervision of normal pregnancy, unspecified, first trimester: Secondary | ICD-10-CM

## 2024-08-04 DIAGNOSIS — O0991 Supervision of high risk pregnancy, unspecified, first trimester: Secondary | ICD-10-CM

## 2024-08-04 DIAGNOSIS — E119 Type 2 diabetes mellitus without complications: Secondary | ICD-10-CM | POA: Diagnosis not present

## 2024-08-04 DIAGNOSIS — O0990 Supervision of high risk pregnancy, unspecified, unspecified trimester: Secondary | ICD-10-CM | POA: Diagnosis not present

## 2024-08-04 DIAGNOSIS — Z8759 Personal history of other complications of pregnancy, childbirth and the puerperium: Secondary | ICD-10-CM | POA: Diagnosis not present

## 2024-08-04 DIAGNOSIS — Z9889 Other specified postprocedural states: Secondary | ICD-10-CM

## 2024-08-04 DIAGNOSIS — Z3A10 10 weeks gestation of pregnancy: Secondary | ICD-10-CM

## 2024-08-04 DIAGNOSIS — O3111X0 Continuing pregnancy after spontaneous abortion of one fetus or more, first trimester, not applicable or unspecified: Secondary | ICD-10-CM

## 2024-08-04 DIAGNOSIS — Z794 Long term (current) use of insulin: Secondary | ICD-10-CM | POA: Diagnosis not present

## 2024-08-04 DIAGNOSIS — O3110X0 Continuing pregnancy after spontaneous abortion of one fetus or more, unspecified trimester, not applicable or unspecified: Secondary | ICD-10-CM

## 2024-08-04 MED ORDER — ASPIRIN 81 MG PO TBEC: 162.0000 mg | DELAYED_RELEASE_TABLET | Freq: Every day | ORAL | 12 refills | Status: AC

## 2024-08-04 NOTE — Progress Notes (Signed)
 Front office notified to send fetal echo referral to [facility].   Vernell RN 08/04/24 1750
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
## 2024-08-05 ENCOUNTER — Encounter: Payer: Self-pay | Admitting: Certified Nurse Midwife

## 2024-08-05 ENCOUNTER — Encounter (HOSPITAL_COMMUNITY): Payer: Self-pay | Admitting: Obstetrics & Gynecology

## 2024-08-05 ENCOUNTER — Other Ambulatory Visit: Payer: Self-pay

## 2024-08-05 ENCOUNTER — Inpatient Hospital Stay (HOSPITAL_COMMUNITY)
Admission: AD | Admit: 2024-08-05 | Discharge: 2024-08-05 | Disposition: A | Discharge status: Home / Self Care | Payer: Self-pay | Attending: Obstetrics & Gynecology | Admitting: Obstetrics & Gynecology

## 2024-08-05 DIAGNOSIS — Z7984 Long term (current) use of oral hypoglycemic drugs: Secondary | ICD-10-CM | POA: Diagnosis not present

## 2024-08-05 DIAGNOSIS — R519 Headache, unspecified: Secondary | ICD-10-CM | POA: Diagnosis present

## 2024-08-05 DIAGNOSIS — O30041 Twin pregnancy, dichorionic/diamniotic, first trimester: Secondary | ICD-10-CM | POA: Diagnosis not present

## 2024-08-05 DIAGNOSIS — O99351 Diseases of the nervous system complicating pregnancy, first trimester: Secondary | ICD-10-CM | POA: Insufficient documentation

## 2024-08-05 DIAGNOSIS — O3121X1 Continuing pregnancy after intrauterine death of one fetus or more, first trimester, fetus 1: Secondary | ICD-10-CM | POA: Diagnosis not present

## 2024-08-05 DIAGNOSIS — Z3A10 10 weeks gestation of pregnancy: Secondary | ICD-10-CM | POA: Insufficient documentation

## 2024-08-05 DIAGNOSIS — O24111 Pre-existing diabetes mellitus, type 2, in pregnancy, first trimester: Secondary | ICD-10-CM | POA: Insufficient documentation

## 2024-08-05 DIAGNOSIS — O219 Vomiting of pregnancy, unspecified: Secondary | ICD-10-CM

## 2024-08-05 DIAGNOSIS — R110 Nausea: Secondary | ICD-10-CM | POA: Diagnosis present

## 2024-08-05 DIAGNOSIS — G43919 Migraine, unspecified, intractable, without status migrainosus: Secondary | ICD-10-CM | POA: Diagnosis not present

## 2024-08-05 DIAGNOSIS — Z794 Long term (current) use of insulin: Secondary | ICD-10-CM | POA: Diagnosis not present

## 2024-08-05 LAB — ANEMIA PROFILE B
Ferritin: 34 ng/mL (ref 15–150)
Folate: 17.5 ng/mL (ref >3.0)
Iron Saturation: 22 % (ref 15–55)
Iron: 105 ug/dL (ref 27–159)
Retic Ct Pct: 1.6 % (ref 0.6–2.6)
Total Iron Binding Capacity: 487 ug/dL — ABNORMAL HIGH (ref 250–450)
UIBC: 382 ug/dL (ref 131–425)
Vitamin B-12: 640 pg/mL (ref 232–1245)

## 2024-08-05 LAB — COMPREHENSIVE METABOLIC PANEL WITH GFR
ALT: 15 IU/L (ref 0–32)
AST: 10 IU/L (ref 0–40)
Albumin: 4.3 g/dL (ref 3.9–4.9)
Alkaline Phosphatase: 115 IU/L (ref 41–116)
BUN/Creatinine Ratio: 12 (ref 9–23)
BUN: 10 mg/dL (ref 6–20)
Bilirubin Total: <0.2 mg/dL (ref 0.0–1.2)
CO2: 22 mmol/L (ref 20–29)
Calcium: 9.8 mg/dL (ref 8.7–10.2)
Chloride: 97 mmol/L (ref 96–106)
Creatinine, Ser: 0.81 mg/dL (ref 0.57–1.00)
Globulin, Total: 3 g/dL (ref 1.5–4.5)
Glucose: 342 mg/dL — ABNORMAL HIGH (ref 70–99)
Potassium: 4.9 mmol/L (ref 3.5–5.2)
Sodium: 131 mmol/L — ABNORMAL LOW (ref 134–144)
Total Protein: 7.3 g/dL (ref 6.0–8.5)
eGFR: 99 mL/min/1.73 (ref >59)

## 2024-08-05 LAB — PROTEIN / CREATININE RATIO, URINE
Creatinine, Urine: 28.9 mg/dL
Protein, Ur: 16.1 mg/dL
Protein/Creat Ratio: 557 mg/g{creat} — ABNORMAL HIGH (ref 0–200)

## 2024-08-05 LAB — CBC/D/PLT+RPR+RH+ABO+RUBIGG...
Antibody Screen: NEGATIVE
Basophils Absolute: 0 x10E3/uL (ref 0.0–0.2)
Basos: 0 %
EOS (ABSOLUTE): 0.1 x10E3/uL (ref 0.0–0.4)
Eos: 2 %
HCV Ab: NONREACTIVE
HIV Screen 4th Generation wRfx: NONREACTIVE
Hematocrit: 46.1 % (ref 34.0–46.6)
Hemoglobin: 14.2 g/dL (ref 11.1–15.9)
Hepatitis B Surface Ag: NEGATIVE
Immature Grans (Abs): 0 x10E3/uL (ref 0.0–0.1)
Immature Granulocytes: 0 %
Lymphocytes Absolute: 1.3 x10E3/uL (ref 0.7–3.1)
Lymphs: 30 %
MCH: 24.8 pg — ABNORMAL LOW (ref 26.6–33.0)
MCHC: 30.8 g/dL — ABNORMAL LOW (ref 31.5–35.7)
MCV: 81 fL (ref 79–97)
Monocytes Absolute: 0.3 x10E3/uL (ref 0.1–0.9)
Monocytes: 7 %
Neutrophils Absolute: 2.8 x10E3/uL (ref 1.4–7.0)
Neutrophils: 61 %
Platelets: 316 x10E3/uL (ref 150–450)
RBC: 5.72 x10E6/uL — ABNORMAL HIGH (ref 3.77–5.28)
RDW: 13.9 % (ref 11.7–15.4)
RPR Ser Ql: NONREACTIVE
Rh Factor: POSITIVE
Rubella Antibodies, IGG: 22 {index} (ref >0.99)
WBC: 4.5 x10E3/uL (ref 3.4–10.8)

## 2024-08-05 LAB — THYROID PANEL WITH TSH
Free Thyroxine Index: 2.5 (ref 1.2–4.9)
T3 Uptake Ratio: 21 % — ABNORMAL LOW (ref 24–39)
T4, Total: 11.9 ug/dL (ref 4.5–12.0)
TSH: 1.14 u[IU]/mL (ref 0.450–4.500)

## 2024-08-05 LAB — HCV INTERPRETATION

## 2024-08-05 MED ORDER — ACETAMINOPHEN-CAFFEINE 500-65 MG PO TABS
2.0000 | ORAL_TABLET | Freq: Once | ORAL | Status: AC
  Administered 2024-08-05: 2 via ORAL
  Filled 2024-08-05: qty 2

## 2024-08-05 MED ORDER — PROMETHAZINE HCL 25 MG PO TABS: 25.0000 mg | ORAL_TABLET | Freq: Four times a day (QID) | ORAL | 0 refills | Status: DC | PRN

## 2024-08-05 MED ORDER — PROCHLORPERAZINE MALEATE 10 MG PO TABS
10.0000 mg | ORAL_TABLET | Freq: Once | ORAL | Status: AC
  Administered 2024-08-05: 10 mg via ORAL
  Filled 2024-08-05: qty 1

## 2024-08-05 MED ORDER — ONDANSETRON 4 MG PO TBDP
8.0000 mg | ORAL_TABLET | Freq: Once | ORAL | Status: AC
  Administered 2024-08-05: 8 mg via ORAL
  Filled 2024-08-05: qty 2

## 2024-08-05 MED ORDER — SCOPOLAMINE 1 MG/3DAYS TD PT72: 1.0000 | MEDICATED_PATCH | TRANSDERMAL | 0 refills | Status: DC

## 2024-08-05 MED ORDER — PROCHLORPERAZINE MALEATE 10 MG PO TABS: 10.0000 mg | ORAL_TABLET | Freq: Four times a day (QID) | ORAL | 0 refills | Status: DC | PRN

## 2024-08-05 MED ORDER — ACETAMINOPHEN-CAFFEINE 500-65 MG PO TABS
1.0000 | ORAL_TABLET | Freq: Once | ORAL | Status: AC
  Administered 2024-08-05: 1 via ORAL
  Filled 2024-08-05: qty 1

## 2024-08-05 MED ORDER — SCOPOLAMINE 1 MG/3DAYS TD PT72
1.0000 | MEDICATED_PATCH | Freq: Once | TRANSDERMAL | Status: DC
  Administered 2024-08-05: 1 mg via TRANSDERMAL
  Filled 2024-08-05: qty 1

## 2024-08-05 MED ORDER — CYCLOBENZAPRINE HCL 10 MG PO TABS
10.0000 mg | ORAL_TABLET | Freq: Two times a day (BID) | ORAL | 2 refills | Status: AC | PRN
Start: 2024-08-05 — End: ?

## 2024-08-05 NOTE — Discharge Instructions (Signed)
    [facility]     [facility]                    [facility]                 [facility]    **Purchase ONE of these store-brand Excedrin Tension Headache medications. Take it as directed for relief of headache. DO NOT TAKE AT THE SAME TIME AS TYLENOL /ACETAMINOPHEN **

## 2024-08-05 NOTE — Progress Notes (Signed)
 Pt feeling nauseated and heaving, able to keep one Excedrin down.

## 2024-08-05 NOTE — MAU Note (Addendum)
 Denise [name] is a 33 y.o. at [redacted]w[redacted]d here in MAU reporting: she has a HA on the left side starting in temple and radiating into left ear.  Reports took Tylenol  one tab two days ago no relief noted. Denies VB or any other pain.  LMP: 05/25/2024 Onset of complaint: 3 days. Pain score: 8 Vitals:   08/05/24 1031  BP: (!) 132/92  Pulse: 74  Resp: 19  Temp: 98.5 F (36.9 C)  SpO2: 100%     FHT:   Lab orders placed from triage: None

## 2024-08-05 NOTE — MAU Provider Note (Signed)
 History     CSN: [id number]  Arrival date and time: 08/05/24 0930   Event Date/Time   First Provider Initiated Contact with Patient 08/05/24 1052      Chief Complaint  Patient presents with   Headache   HPI [name] is a 33 y.o. year old [phone] female at [redacted]w[redacted]d weeks gestation who presents to MAU reporting H/A x 3 days and increasing nausea. She reports that the H/A starts at her LT temple and radiates down to her LT ear; pain rated 8/10. She tried Tylenol  325 mg 2 days ago, but it didn't work. She states, I didn't take anything else, because I'm not a pharmacy tech, so I'm not about to go mixing medications. She has a h/o migraine H/As. She states, I'm trying to avoid this H/A getting as bad as the ones I've had in the past. She has not vomited while in MAU, but reports I keep gagging. She reports she forgot to mention the nausea at her NOB appt yesterday, so she has not gotten a Rx for it. Her pregnancy is complicated by T2DM on insulin  and oral med, IUFD of twin A in this current pregnancy, h/o PTB. She is scheduled to have a cerclage placed on 08/30/2024. She receives [facility] with [facility]; next appt is 09/01/2024.   OB History     Gravida  4   Para  2   Term  1   Preterm  1   AB  1   Living  2      SAB  1   IAB      Ectopic      Multiple      Live Births  2           Past Medical History:  Diagnosis Date   Allergy    Arthritis    Asthma    Gestational diabetes    Hx of chlamydia infection 2017   Pregnancy induced hypertension    Pre-E   Seasonal allergies    Trichomonas 2017    Past Surgical History:  Procedure Laterality Date   CERVICAL CERCLAGE N/A 11/23/2018   Procedure: CERCLAGE CERVICAL;  Surgeon: Fredirick Glenys RAMAN, MD;  Location: [facility] BIRTHING SUITES;  Service: Gynecology;  Laterality: N/A;   DILATION AND CURETTAGE OF UTERUS  05/13/2022    Family History  Problem Relation Age of Onset   Arthritis Mother    Diabetes Mother     Diabetes Maternal Grandmother    Arthritis Maternal Grandmother     Social History   Tobacco Use   Smoking status: Never   Smokeless tobacco: Never  Vaping Use   Vaping status: Never Used  Substance Use Topics   Alcohol use: Not Currently   Drug use: No    Allergies:  Allergies  Allergen Reactions   Farxiga  [Dapagliflozin ] Nausea And Vomiting    Medications Prior to Admission  Medication Sig Dispense Refill Last Dose/Taking   albuterol  (VENTOLIN  HFA) 108 (90 Base) MCG/ACT inhaler Inhale 2 puffs into the lungs.      aspirin  EC 81 MG tablet Take 2 tablets (162 mg total) by mouth daily. Swallow whole. 60 tablet 12    Blood Glucose Monitoring Suppl (ACCU-CHEK GUIDE) w/Device KIT Use as advised 1 kit 0    escitalopram (LEXAPRO) 20 MG tablet Take 20 mg by mouth.      glucose blood (ACCU-CHEK GUIDE) test strip Use as instructed 3-4x a day 300 each 3    insulin  glargine (  LANTUS  SOLOSTAR) 100 UNIT/ML Solostar Pen Inject 36 Units into the skin at bedtime. (Patient taking differently: Inject 50 Units into the skin at bedtime.) 45 mL 1    insulin  lispro (HUMALOG  KWIKPEN) 100 UNIT/ML KwikPen Inject 10-14 Units into the skin 3 (three) times daily. (Patient not taking: Reported on 08/04/2024)      Insulin  Pen Needle 32G X 4 MM MISC Use 4x a day (Patient not taking: Reported on 08/04/2024) 300 each 3    metFORMIN  (GLUCOPHAGE -XR) 500 MG 24 hr tablet Take 3 tablets (1,500 mg total) by mouth daily. (Patient taking differently: Take 1,000 mg by mouth daily with breakfast.) 270 tablet 3    progesterone  200 MG SUPP Place 200 mg vaginally at bedtime.       Review of Systems  Constitutional: Negative.   HENT: Negative.    Eyes: Negative.   Respiratory: Negative.    Cardiovascular: Negative.   Gastrointestinal:  Positive for nausea.  Endocrine: Negative.   Genitourinary: Negative.   Musculoskeletal: Negative.   Skin: Negative.   Allergic/Immunologic: Negative.   Neurological:  Positive for  headaches (in LT temple radiating down to LT ear).  Hematological: Negative.   Psychiatric/Behavioral: Negative.     Physical Exam   Blood pressure (!) 132/92, pulse 74, temperature 98.5 F (36.9 C), temperature source Oral, resp. rate 19, height 5' 6 (1.676 m), weight 99.3 kg, last menstrual period 05/25/2024, SpO2 100%, unknown if currently breastfeeding.  Physical Exam Vitals and nursing note reviewed.  Constitutional:      Appearance: Normal appearance. She is obese.  Cardiovascular:     Rate and Rhythm: Normal rate.  Pulmonary:     Effort: Pulmonary effort is normal.  Abdominal:     Palpations: Abdomen is soft.  Genitourinary:    Comments: not indicated Musculoskeletal:        General: Normal range of motion.  Skin:    General: Skin is warm and dry.  Neurological:     Mental Status: She is alert and oriented to person, place, and time.  Psychiatric:        Mood and Affect: Mood normal.        Behavior: Behavior normal.        Thought Content: Thought content normal.        Judgment: Judgment normal.    MAU Course  Procedures  MDM Excedrin Tension H/A 2 caplets - vomited up 1 caplet Additional Excedrin Tension H/A 1 caplet --  Zofran  8 mg ODT - vomited Compazine  10 mg -- N/V resolved Scope Patch -- N/V relieved  Assessment and Plan  1. Intractable migraine without status migrainosus, unspecified migraine type (Primary) - Buy OTC Excedrin Tension H/A - Prescription for: Flexeril  10 mg po BID prn H/A, if no relief after Excedrin use  2. Nausea and vomiting during pregnancy prior to [redacted] weeks gestation - Prescription for: Scope Patch 1 every 72 hrs prn N/V - Prescription for: Compazine  10 mg every 6 hrs prn N/V - Prescription for: Phenergan  25 mg po or pv every 6 hrs prn N/V, if Compazine  not effective or needs sleep as well  3. [redacted] weeks gestation of pregnancy   - Discharge home - Keep scheduled appt with L&D OR on 08/30/2024 - Patient verbalized an  understanding of the plan of care and agrees.   Gaige Sebo, CNM 08/05/2024, 10:52 AM

## 2024-08-10 ENCOUNTER — Telehealth: Admitting: Obstetrics and Gynecology

## 2024-08-10 DIAGNOSIS — Z794 Long term (current) use of insulin: Secondary | ICD-10-CM | POA: Diagnosis not present

## 2024-08-10 DIAGNOSIS — E119 Type 2 diabetes mellitus without complications: Secondary | ICD-10-CM

## 2024-08-10 MED ORDER — LANTUS SOLOSTAR 100 UNIT/ML ~~LOC~~ SOPN: 50.0000 [IU] | PEN_INJECTOR | Freq: Every day | SUBCUTANEOUS | 3 refills | Status: DC

## 2024-08-10 MED ORDER — DEXCOM G7 SENSOR MISC: 1.0000 | Freq: Every day | 3 refills | Status: DC

## 2024-08-10 NOTE — Progress Notes (Signed)
 TELEHEALTH OBSTETRICS VISIT ENCOUNTER NOTE  FOR DIABETES MANAGEMENT DURING PREGNANCY    Provider location: [facility] for [facility]   Patient location: Home  I connected with Denise [name] on 08/10/24 at  3:30 PM EDT by telephone at home and verified that I am speaking with the correct person using two identifiers. Of note, unable to do video encounter due to technical difficulties.    I discussed the limitations, risks, security and privacy concerns of performing an evaluation and management service by telephone and the availability of in person appointments. I also discussed with the patient that there may be a patient responsible charge related to this service. The patient expressed understanding and agreed to proceed.   History:   Denise [name] is a 33 y.o. [id number] at [redacted]w[redacted]d by LMP being seen today for diabetes management during pregnancy.   Patient reports concerns about elevated BS. Reports fetal movement. Denies any contractions, bleeding or leaking of fluid.   The following portions of the patient's history were reviewed and updated as appropriate: allergies, current medications, past family history, past medical history, past social history, past surgical history and problem list.   Past Medical History:  Diagnosis Date   Allergy    Arthritis    Asthma    Gestational diabetes    History of cervical cerclage 07/28/2013   Rescue cerclage in place     Previous pregnancy with short cervix, but term delivery  This pregnancy, serial cervical lengths:  12/2 cervix long and closed  12/23 cervix 1.4 cm, vaginal progesterone  started  1/6 cervical length 0.2 cm, rescue cerclage was placed     Hx of chlamydia infection 2017   Pregnancy induced hypertension    Pre-E   Seasonal allergies    Trichomonas 2017   Past Surgical History:  Procedure Laterality Date   CERVICAL CERCLAGE N/A 11/23/2018   Procedure: CERCLAGE CERVICAL;  Surgeon: Fredirick Glenys RAMAN, MD;  Location: [facility]  BIRTHING SUITES;  Service: Gynecology;  Laterality: N/A;   DILATION AND CURETTAGE OF UTERUS  05/13/2022   Family History  Problem Relation Age of Onset   Arthritis Mother    Diabetes Mother    Diabetes Maternal Grandmother    Arthritis Maternal Grandmother    Social History   Tobacco Use   Smoking status: Never   Smokeless tobacco: Never  Vaping Use   Vaping status: Never Used  Substance Use Topics   Alcohol use: Not Currently   Drug use: No   Allergies  Allergen Reactions   Farxiga  [Dapagliflozin ] Nausea And Vomiting   Current Outpatient Medications on File Prior to Visit  Medication Sig Dispense Refill   albuterol  (VENTOLIN  HFA) 108 (90 Base) MCG/ACT inhaler Inhale 2 puffs into the lungs.     aspirin  EC 81 MG tablet Take 2 tablets (162 mg total) by mouth daily. Swallow whole. 60 tablet 12   cyclobenzaprine  (FLEXERIL ) 10 MG tablet Take 1 tablet (10 mg total) by mouth 2 (two) times daily as needed. 20 tablet 2   escitalopram (LEXAPRO) 20 MG tablet Take 20 mg by mouth.     insulin  glargine (LANTUS  SOLOSTAR) 100 UNIT/ML Solostar Pen Inject 36 Units into the skin at bedtime. (Patient taking differently: Inject 50 Units into the skin at bedtime.) 45 mL 1   insulin  lispro (HUMALOG  KWIKPEN) 100 UNIT/ML KwikPen Inject 10-14 Units into the skin 3 (three) times daily.     metFORMIN  (GLUCOPHAGE -XR) 500 MG 24 hr tablet Take 3 tablets (1,500 mg  total) by mouth daily. (Patient taking differently: Take 1,000 mg by mouth daily with breakfast.) 270 tablet 3   prochlorperazine  (COMPAZINE ) 10 MG tablet Take 1 tablet (10 mg total) by mouth every 6 (six) hours as needed for nausea or vomiting. 120 tablet 0   progesterone  200 MG SUPP Place 200 mg vaginally at bedtime.     promethazine  (PHENERGAN ) 25 MG tablet Take 1 tablet (25 mg total) by mouth every 6 (six) hours as needed. May insert vaginally, if unable to keep anything down by mouth 120 tablet 0   scopolamine  (TRANSDERM-SCOP) 1 MG/3DAYS Place 1  patch (1 mg total) onto the skin every 3 (three) days. 10 patch 0   Blood Glucose Monitoring Suppl (ACCU-CHEK GUIDE) w/Device KIT Use as advised (Patient not taking: Reported on 08/10/2024) 1 kit 0   glucose blood (ACCU-CHEK GUIDE) test strip Use as instructed 3-4x a day (Patient not taking: Reported on 08/10/2024) 300 each 3   Insulin  Pen Needle 32G X 4 MM MISC Use 4x a day (Patient not taking: Reported on 08/10/2024) 300 each 3   No current facility-administered medications on file prior to visit.      Objective:   General:  Alert, oriented and cooperative.   Mental Status: Normal mood and affect perceived. Normal judgment and thought content.   The Rest of physical exam deferred due to type of encounter  Type 2 DM  Diagnosed in 2023, blood sugar was over 700 at the time of diagnoses.    Viability scan: reassuring, twin gestation with vanishing twin.    Anatomy scan: Scheduled    Eye exam:    A1c at the time of diagnoses.    Most recent HgA1c: 14% (06/28/2024)  Fetal Echo: primary OB to schedule.    PCP or endocrinologist. Has a PCP Dr. Murleen     Blood sugar monitoring: not checking BS at all Interested in Dexcom.  Interested in insulin  pump.   Back up glucose monitor: Has a glucometer but cannot find it. Does not use it.      Current Diabetes medications:   - Metformin  1000 mg BID  - Lantus  50 units at bedtime.   Plan:   -Continue Metformin  1000 mg BID  -Lantus  50 units at bedtime.   -Patient will come [facility] office tomorrow for Dexcom insertion by RN. -Dexcom RX sent to pharmacy. -Once more BS data is available will refer for insulin  pump.   -Discussed only 30-40 grams of carbohydrates with each meal, with majority of your meals being protein and high fiber vegetables.    -Start a fiber supplement everyday.    -Protein snack before bed. Recommend 10-20 grams of protein before bed.    -Anticipate weekly diabetes visits until BS's are better  controlled -Anticipate frequent in person OB visits along with MFM care.  -q4 week growth US  starting at 20w with antenatal weekly testing starting at 32 weeks or sooner if needed.    -Continue BASA 81 mg      This visit is for the purposes of diabetes management only. Please keep scheduled OB visits with OB team for prenatal care.    Preterm labor symptoms and general obstetric precautions including but not limited to vaginal bleeding, contractions, leaking of fluid and fetal movement were reviewed in detail with the patient.  I discussed the assessment and treatment plan with the patient. The patient was provided an opportunity to ask questions and all were answered. The patient agreed with the plan  and demonstrated an understanding of the instructions. The patient was advised to call back or seek an in-person office evaluation/go to MAU at [facility] for any urgent or concerning symptoms. Please refer to After Visit Summary for other counseling recommendations.    I provided 15 minutes of non-face-to-face time during this encounter.  Zemira Zehring, Delon FERNS, NP Faculty Practice Center for [employer], [facility]

## 2024-08-11 ENCOUNTER — Telehealth: Payer: Self-pay | Admitting: Obstetrics and Gynecology

## 2024-08-11 ENCOUNTER — Telehealth: Payer: Self-pay

## 2024-08-11 ENCOUNTER — Ambulatory Visit (INDEPENDENT_AMBULATORY_CARE_PROVIDER_SITE_OTHER)

## 2024-08-11 DIAGNOSIS — O24111 Pre-existing diabetes mellitus, type 2, in pregnancy, first trimester: Secondary | ICD-10-CM

## 2024-08-11 DIAGNOSIS — Z3A11 11 weeks gestation of pregnancy: Secondary | ICD-10-CM

## 2024-08-11 NOTE — Progress Notes (Addendum)
 RN received Dexcom G7 Sensor rejection and requiring Prior Authorization. PA sent to Cover my Meds, pending.  Silvano LELON Piano, RN  2nd note, RN received notification Dexcom approved from 08/11/24-02/07/25. Patient notified via mychart. Approval scanned into chart.   Silvano LELON Piano, RN

## 2024-08-11 NOTE — Progress Notes (Addendum)
 Pt here for Dexcom placement. RN provided education on Dexcom placement and assisted patient with set up with app and syncing with her phone and with office to share data. Pt tolerated placement well. Pt verbalized understanding for care of Dexcom.   Silvano ORN Veora Fonte, RN   2nd note, patient returned to office at 1pm, reporting Dexcom sensor came off after scratching arm. RN applied new sensor to patient, tolerated well.  Silvano ORN Piano, RN

## 2024-08-11 NOTE — Telephone Encounter (Signed)
 Patient called upset that her Dexcom would not stay on.  She was frustrated and saying that we aren't putting it on right.    I called Delon Emms to discuss options.   I called patient within 10 minutes to have her take sample to the pharmacy for further instructions on application.    Relayed instructions that area of application should be scrubbed and have not lotions or oils.   Patient stated she had already left office and was tired of waiting.  Patient has Dexcom RX ready for pick up at her pharmacy this evening.

## 2024-08-11 NOTE — Telephone Encounter (Signed)
 Patient sent mychart message and requested return call because 2nd sensor came off. Pt reported purchased a piece to go over to help hold it on. RN informed patient that PA was approved and should be able to pick up through pharmacy. RN offered to call pharmacy to see if Dexcom ready for pick up, pt agreed. RN asked if patient would feel comfortable applying herself or having her friend apply. Pt reported she could try. RN spoke with [facility] pharmacy who reported Dexcom would be ready this evening for pick up. RN informed patient via mychart.   Silvano LELON Piano, RN

## 2024-08-13 ENCOUNTER — Encounter: Payer: Self-pay | Admitting: Obstetrics and Gynecology

## 2024-08-13 ENCOUNTER — Ambulatory Visit: Payer: Self-pay | Admitting: Obstetrics and Gynecology

## 2024-08-13 DIAGNOSIS — E11319 Type 2 diabetes mellitus with unspecified diabetic retinopathy without macular edema: Secondary | ICD-10-CM

## 2024-08-13 DIAGNOSIS — E119 Type 2 diabetes mellitus without complications: Secondary | ICD-10-CM

## 2024-08-13 MED ORDER — LANTUS SOLOSTAR 100 UNIT/ML ~~LOC~~ SOPN: 90.0000 [IU] | PEN_INJECTOR | Freq: Every day | SUBCUTANEOUS | 3 refills | Status: DC

## 2024-08-16 LAB — HORIZON CUSTOM: REPORT SUMMARY: POSITIVE — AB

## 2024-08-17 ENCOUNTER — Other Ambulatory Visit: Payer: Self-pay | Admitting: Obstetrics and Gynecology

## 2024-08-17 ENCOUNTER — Telehealth (INDEPENDENT_AMBULATORY_CARE_PROVIDER_SITE_OTHER): Admitting: Obstetrics and Gynecology

## 2024-08-17 DIAGNOSIS — Z794 Long term (current) use of insulin: Secondary | ICD-10-CM

## 2024-08-17 DIAGNOSIS — E119 Type 2 diabetes mellitus without complications: Secondary | ICD-10-CM

## 2024-08-17 MED ORDER — LANTUS SOLOSTAR 100 UNIT/ML ~~LOC~~ SOPN: 70.0000 [IU] | PEN_INJECTOR | Freq: Every day | SUBCUTANEOUS | 3 refills | Status: DC

## 2024-08-17 MED ORDER — PROGESTERONE 200 MG VA SUPP: 200.0000 mg | Freq: Every day | VAGINAL | 1 refills | Status: DC

## 2024-08-17 MED ORDER — INSULIN LISPRO 100 UNIT/ML IJ SOLN: INTRAMUSCULAR | 11 refills | Status: DC

## 2024-08-17 MED ORDER — METOCLOPRAMIDE HCL 10 MG PO TABS: 10.0000 mg | ORAL_TABLET | Freq: Three times a day (TID) | ORAL | 1 refills | Status: DC

## 2024-08-17 MED ORDER — OMNIPOD 5 DEXG7G6 INTRO GEN 5 KIT: PACK | 0 refills | Status: DC

## 2024-08-17 MED ORDER — OMNIPOD 5 DEXG7G6 PODS GEN 5 MISC: 11 refills | Status: AC

## 2024-08-17 MED ORDER — INSULIN LISPRO (1 UNIT DIAL) 100 UNIT/ML (KWIKPEN): 10.0000 [IU] | PEN_INJECTOR | Freq: Three times a day (TID) | SUBCUTANEOUS | Status: DC

## 2024-08-17 NOTE — Progress Notes (Signed)
 [name]   TELEHEALTH OBSTETRICS VISIT ENCOUNTER NOTE  FOR DIABETES MANAGEMENT DURING PREGNANCY    Provider location: Center for [facility]   Patient location: Home  I connected withNAME@ on 08/17/24 at  8:50 AM EDT by telephone at home and verified that I am speaking with the correct person using two identifiers. Of note, unable to do video encounter due to technical difficulties.    I discussed the limitations, risks, security and privacy concerns of performing an evaluation and management service by telephone and the availability of in person appointments. I also discussed with the patient that there may be a patient responsible charge related to this service. The patient expressed understanding and agreed to proceed.   History:   Denise [name] is a 33 y.o. [phone] at [redacted]w[redacted]d being seen today for diabetes management during pregnancy.   Patient reports no complaints. Reports fetal movement. Denies any contractions, bleeding or leaking of fluid.   The following portions of the patient's history were reviewed and updated as appropriate: allergies, current medications, past family history, past medical history, past social history, past surgical history and problem list.   Past Medical History:  Diagnosis Date   Allergy    Arthritis    Asthma    Gestational diabetes    History of cervical cerclage 07/28/2013   Rescue cerclage in place     Previous pregnancy with short cervix, but term delivery  This pregnancy, serial cervical lengths:  12/2 cervix long and closed  12/23 cervix 1.4 cm, vaginal progesterone  started  1/6 cervical length 0.2 cm, rescue cerclage was placed     Hx of chlamydia infection 2017   Pregnancy induced hypertension    Pre-E   Seasonal allergies    Trichomonas 2017   Past Surgical History:  Procedure Laterality Date   CERVICAL CERCLAGE N/A 11/23/2018   Procedure: CERCLAGE CERVICAL;  Surgeon: Fredirick Glenys RAMAN, MD;  Location: [facility] BIRTHING SUITES;   Service: Gynecology;  Laterality: N/A;   DILATION AND CURETTAGE OF UTERUS  05/13/2022   Family History  Problem Relation Age of Onset   Arthritis Mother    Diabetes Mother    Diabetes Maternal Grandmother    Arthritis Maternal Grandmother    Social History   Tobacco Use   Smoking status: Never   Smokeless tobacco: Never  Vaping Use   Vaping status: Never Used  Substance Use Topics   Alcohol use: Not Currently   Drug use: No   Allergies  Allergen Reactions   Farxiga  [Dapagliflozin ] Nausea And Vomiting   Current Outpatient Medications on File Prior to Visit  Medication Sig Dispense Refill   albuterol  (VENTOLIN  HFA) 108 (90 Base) MCG/ACT inhaler Inhale 2 puffs into the lungs.     aspirin  EC 81 MG tablet Take 2 tablets (162 mg total) by mouth daily. Swallow whole. 60 tablet 12   Blood Glucose Monitoring Suppl (ACCU-CHEK GUIDE) w/Device KIT Use as advised 1 kit 0   Continuous Glucose Sensor (DEXCOM G7 SENSOR) MISC 1 each by Does not apply route daily. 3 each 3   cyclobenzaprine  (FLEXERIL ) 10 MG tablet Take 1 tablet (10 mg total) by mouth 2 (two) times daily as needed. 20 tablet 2   escitalopram (LEXAPRO) 20 MG tablet Take 20 mg by mouth.     glucose blood (ACCU-CHEK GUIDE) test strip Use as instructed 3-4x a day 300 each 3   insulin  glargine (LANTUS  SOLOSTAR) 100 UNIT/ML Solostar Pen Inject 90 Units into the skin daily. 27  mL 3   Insulin  Pen Needle 32G X 4 MM MISC Use 4x a day 300 each 3   metFORMIN  (GLUCOPHAGE -XR) 500 MG 24 hr tablet Take 3 tablets (1,500 mg total) by mouth daily. 270 tablet 3   prochlorperazine  (COMPAZINE ) 10 MG tablet Take 1 tablet (10 mg total) by mouth every 6 (six) hours as needed for nausea or vomiting. 120 tablet 0   progesterone  200 MG SUPP Place 200 mg vaginally at bedtime.     promethazine  (PHENERGAN ) 25 MG tablet Take 1 tablet (25 mg total) by mouth every 6 (six) hours as needed. May insert vaginally, if unable to keep anything down by mouth 120 tablet 0    scopolamine  (TRANSDERM-SCOP) 1 MG/3DAYS Place 1 patch (1 mg total) onto the skin every 3 (three) days. 10 patch 0   insulin  lispro (HUMALOG  KWIKPEN) 100 UNIT/ML KwikPen Inject 10-14 Units into the skin 3 (three) times daily. (Patient not taking: Reported on 08/17/2024)     No current facility-administered medications on file prior to visit.      Objective:   General:  Alert, oriented and cooperative.   Mental Status: Normal mood and affect perceived. Normal judgment and thought content.   The Rest of physical exam deferred due to type of encounter   Type 2 DM  Diagnosed in 2023, blood sugar was over 700 at the time of diagnoses.    Viability scan: reassuring, twin gestation with vanishing twin.    Anatomy scan: Scheduled    Eye exam: Due in January 2026   A1c at the time of diagnoses.    Most recent HgA1c: 14% (06/28/2024)   Fetal Echo: primary OB to schedule.    PCP or endocrinologist. Has a PCP Dr. Ivantchev    Dexcom now in place Target BS set at 65-140, currently 0% of BS are in target range BS are 300+ all day and all night  Interested in insulin  pump- orders placed. Follow up scheduled with DM team for placement.    Back up glucose monitor: Has a glucometer but cannot find it. Does not use it.       Current Diabetes medications:   - Metformin  1000 mg BID  - Lantus  50 units at bedtime - 2 missed doses of Lantus  over the last week.   Plan:   - Continue Metformin  1000 mg BID  - Increase Lantus  to 60 units at bedtime.  - Start 10 units of Lantus  during the day.  - Continue Humalog  10 units before all meals.     Patient displeased with her care today. Felt staff was insensitive and triggering.  Stated to provider that she does not like the questions that I was asking her. NP expressed to patient that our office may not be a good fit for her if she is upset with her care and aggressive diabetes management may not be the right fit for her. She insisted that we  continue diabetes management.    -Discussed only 30-40 grams of carbohydrates with each meal, with majority of your meals being protein and high fiber vegetables.    -Start a fiber supplement everyday.    -Protein snack before bed. Recommend 10-20 grams of protein before bed.    -Anticipate weekly diabetes visits until BS's are better controlled -Anticipate frequent in person OB visits along with MFM care.  -q4 week growth US  starting at 20w with antenatal weekly testing starting at 32 weeks or sooner if needed.    -Continue BASA 81 mg  This visit is for the purposes of diabetes management only. Please keep scheduled OB visits with OB team for prenatal care.    Preterm labor symptoms and general obstetric precautions including but not limited to vaginal bleeding, contractions, leaking of fluid and fetal movement were reviewed in detail with the patient.  I discussed the assessment and treatment plan with the patient. The patient was provided an opportunity to ask questions and all were answered. The patient agreed with the plan and demonstrated an understanding of the instructions. The patient was advised to call back or seek an in-person office evaluation/go to MAU at [facility] for any urgent or concerning symptoms. Please refer to After Visit Summary for other counseling recommendations.    I provided 15 minutes of non-face-to-face time during this encounter.   Arvine Clayburn, Delon FERNS, NP Faculty Practice Center for [employer], [facility]     Aditi Rovira, Delon FERNS, NP Faculty Practice Center for [employer], [facility]

## 2024-08-17 NOTE — Telephone Encounter (Signed)
 Requested by NP to get patient started on an insulin  pump.  Orders sent for cosign.  Denise [name], RD, LDN, CDCES, DipACLM

## 2024-08-18 ENCOUNTER — Other Ambulatory Visit: Payer: Self-pay | Admitting: Obstetrics and Gynecology

## 2024-08-18 LAB — PANORAMA PRENATAL TEST FULL PANEL:PANORAMA TEST PLUS 5 ADDITIONAL MICRODELETIONS

## 2024-08-18 MED ORDER — OMNIPOD 5 DEXG7G6 INTRO GEN 5 KIT: PACK | 0 refills | Status: DC

## 2024-08-18 NOTE — Progress Notes (Unsigned)
 Patient was seen for Pre-existing Diabetes During Pregnancy on 08/19/2024   Start time 0820 and End time 0914   Estimated due date: 03/01/2025; [redacted]w[redacted]d  Clinical: Medications:  Current Outpatient Medications:    albuterol  (VENTOLIN  HFA) 108 (90 Base) MCG/ACT inhaler, Inhale 2 puffs into the lungs., Disp: , Rfl:    aspirin  EC 81 MG tablet, Take 2 tablets (162 mg total) by mouth daily. Swallow whole., Disp: 60 tablet, Rfl: 12   Blood Glucose Monitoring Suppl (ACCU-CHEK GUIDE) w/Device KIT, Use as advised, Disp: 1 kit, Rfl: 0   Continuous Glucose Sensor (DEXCOM G7 SENSOR) MISC, 1 each by Does not apply route daily., Disp: 3 each, Rfl: 3   cyclobenzaprine  (FLEXERIL ) 10 MG tablet, Take 1 tablet (10 mg total) by mouth 2 (two) times daily as needed., Disp: 20 tablet, Rfl: 2   escitalopram (LEXAPRO) 20 MG tablet, Take 20 mg by mouth., Disp: , Rfl:    glucose blood (ACCU-CHEK GUIDE) test strip, Use as instructed 3-4x a day, Disp: 300 each, Rfl: 3   Insulin  Disposable Pump (OMNIPOD 5 DEXG7G6 INTRO GEN 5) KIT, Please call when you get RX, Disp: 1 kit, Rfl: 0   Insulin  Disposable Pump (OMNIPOD 5 DEXG7G6 PODS GEN 5) MISC, Use 1 POD every 2 days, Disp: 15 each, Rfl: 11   insulin  glargine (LANTUS  SOLOSTAR) 100 UNIT/ML Solostar Pen, Inject 70 Units into the skin daily. 60 units prior to bedtime and 10 units q AM. Take 12 hours apart. 30 day supply, Disp: 21 mL, Rfl: 3   insulin  lispro (HUMALOG  KWIKPEN) 100 UNIT/ML KwikPen, Inject 10 Units into the skin 3 (three) times daily. Inject 10 units with all meals, take 15-20 minutes before first bite., Disp: , Rfl:    insulin  lispro (HUMALOG ) 100 UNIT/ML injection, Max dose 100 units per day. Used for omnipod insulin  pump, please give 30 day supply, Disp: 10 mL, Rfl: 11   Insulin  Pen Needle 32G X 4 MM MISC, Use 4x a day, Disp: 300 each, Rfl: 3   metFORMIN  (GLUCOPHAGE -XR) 500 MG 24 hr tablet, Take 3 tablets (1,500 mg total) by mouth daily., Disp: 270 tablet, Rfl: 3    metoCLOPramide  (REGLAN ) 10 MG tablet, Take 1 tablet (10 mg total) by mouth 4 (four) times daily -  before meals and at bedtime., Disp: 60 tablet, Rfl: 1   prochlorperazine  (COMPAZINE ) 10 MG tablet, Take 1 tablet (10 mg total) by mouth every 6 (six) hours as needed for nausea or vomiting., Disp: 120 tablet, Rfl: 0   progesterone  200 MG SUPP, Place 1 suppository (200 mg total) vaginally at bedtime., Disp: 30 suppository, Rfl: 1   promethazine  (PHENERGAN ) 25 MG tablet, Take 1 tablet (25 mg total) by mouth every 6 (six) hours as needed. May insert vaginally, if unable to keep anything down by mouth, Disp: 120 tablet, Rfl: 0   scopolamine  (TRANSDERM-SCOP) 1 MG/3DAYS, Place 1 patch (1 mg total) onto the skin every 3 (three) days., Disp: 10 patch, Rfl: 0  Medical History:  Past Medical History:  Diagnosis Date   Allergy    Arthritis    Asthma    Gestational diabetes    History of cervical cerclage 07/28/2013   Rescue cerclage in place     Previous pregnancy with short cervix, but term delivery  This pregnancy, serial cervical lengths:  12/2 cervix long and closed  12/23 cervix 1.4 cm, vaginal progesterone  started  1/6 cervical length 0.2 cm, rescue cerclage was placed     Hx of  chlamydia infection 2017   Pregnancy induced hypertension    Pre-E   Seasonal allergies    Trichomonas 2017    Labs: OGTT: none Lab Results  Component Value Date   HGBA1C 14.1 (A) 01/29/2023    Dietary and Lifestyle History: Pt presents today with her daughter with meter, insulin  pump pods, dexcom G7 in place stating she is connected to [address] clinic. Pt reports she has not yet started the humalog  stating a desire to start pump today. Pt denies having syringes to take her humalog  insulin ; [facility] requested insulin  syringe prescription.  Pt reports she feel asleep last night and forgot to take her basal insulin ; RD encouraged taking near the same hour each evening as prescribed.  Pt reports she would like  to learn how to count carbohydrates stating the is her largest challenge with diabetes management. Pt requests instruction for her glucometer today and this was provided. Pt reports eating out daily. Pt reports a family of 3, Pt does the cooking and shopping. Pt reports she is lactose intolerant and has recently selected lactose milk to try. [facility] reached out to pump trainer to have Pt schedule to place pump. All Pt's questions were answered during this encounter.   Physical Activity: walk 3 days weekly 30 minutes Stress: 5 out of 10 /self care includes: read, journal, walk Sleep: 4-5 hours nightly   24 hr Recall:  First Meal:  eggs, bacon, water Snack:  none Second meal: greek salad with grilled chicken, cucumber sauce, ~1 pita bread  Snack:  3 tootsie rolls Third meal:  burrito taco bell, water Snack:  greek salad with feta cheese, grilled chicken Beverages:  water, sparkling water, diet soda  NUTRITION INTERVENTION  Nutrition education (E-1) on the following topics:   Initial Follow-up  [x]  []  Why dietary management is important in controlling blood glucose [x]  []  Effects each nutrient has on blood glucose levels [x]  []  Simple carbohydrates vs complex carbohydrates [x]  []  Fluid intake [x]  []  Creating a balanced meal plan [x]  []  Carbohydrate counting  [x]  []  When to check blood glucose levels [x]  []  Proper blood glucose monitoring techniques [x]  []  Effect of stress and stress reduction techniques  [x]  []  Exercise effect on blood glucose levels, appropriate exercise during pregnancy [x]  []  Importance of limiting caffeine  and abstaining from alcohol and smoking [x]  []  Medications used for blood sugar control during pregnancy [x]  []  Hypoglycemia and rule of 15 [x]  []  Postpartum self care   Patient has a meter prior to visit. Patient is instructed to engage/test pre breakfast and 2 hours after each meal. CBG: 208 mg/dL, reported as fasting per Pt (239 mg/dL today CGM)  Patient  instructed to monitor glucose levels: QID FBS: 60 - <= 95 mg/dL; 2 hour: <= 879 mg/dL  Patient received handouts: Nutrition Diabetes and Pregnancy Carbohydrate Counting List Blood glucose log Snack ideas for diabetes during pregnancy Plate Planner  Patient will be seen for follow-up with pump trainer for pump placement

## 2024-08-19 ENCOUNTER — Other Ambulatory Visit: Payer: Self-pay | Admitting: Lactation Services

## 2024-08-19 ENCOUNTER — Encounter: Admitting: Dietician

## 2024-08-19 ENCOUNTER — Encounter: Attending: Internal Medicine | Admitting: Dietician

## 2024-08-19 ENCOUNTER — Encounter: Payer: Self-pay | Admitting: Dietician

## 2024-08-19 ENCOUNTER — Telehealth: Payer: Self-pay | Admitting: Dietician

## 2024-08-19 ENCOUNTER — Ambulatory Visit: Admitting: Dietician

## 2024-08-19 ENCOUNTER — Encounter: Payer: Self-pay | Admitting: *Deleted

## 2024-08-19 ENCOUNTER — Other Ambulatory Visit: Payer: Self-pay

## 2024-08-19 ENCOUNTER — Telehealth: Payer: Self-pay | Admitting: *Deleted

## 2024-08-19 ENCOUNTER — Ambulatory Visit: Payer: Self-pay | Admitting: *Deleted

## 2024-08-19 DIAGNOSIS — E119 Type 2 diabetes mellitus without complications: Secondary | ICD-10-CM

## 2024-08-19 DIAGNOSIS — Z794 Long term (current) use of insulin: Secondary | ICD-10-CM | POA: Diagnosis present

## 2024-08-19 DIAGNOSIS — E11319 Type 2 diabetes mellitus with unspecified diabetic retinopathy without macular edema: Secondary | ICD-10-CM

## 2024-08-19 DIAGNOSIS — O0990 Supervision of high risk pregnancy, unspecified, unspecified trimester: Secondary | ICD-10-CM

## 2024-08-19 DIAGNOSIS — Z3A12 12 weeks gestation of pregnancy: Secondary | ICD-10-CM

## 2024-08-19 NOTE — Telephone Encounter (Signed)
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 ----- Message ----- From: Interface, [facility] Lab Results In Sent: 08/05/2024   1:36 PM EDT To: Cornell JONELLE Finder, CNM

## 2024-08-19 NOTE — Telephone Encounter (Signed)
 Patient called and would like for you to contact her with clear instructions of how to take her medication. She is unsure since med has changed and can't remember. She also stated that she needs syringes and a Hema log (I am not sure what that is). Patient advised that a message would be sent to you.

## 2024-08-19 NOTE — Telephone Encounter (Signed)
 Patient saw the RD this am to discuss carbohydrate counting that will be needed for her pump use.  Called patient.  She has her Omnipod 5 kit, PODs, and vial of Humalog .  She is ready for training. I need to obtain orders.  If these can be obtained today then will train her today, if not, will train her tomorrow or when pump orders are obtained.  Leita Constable, RD, LDN, CDCES, DipACLM

## 2024-08-19 NOTE — Progress Notes (Signed)
 Patient is here today for Omnipod 5 insulin  pump training. She was seen by another RD, CDCES this am to learn about diabetes and carbohydrate counting. She is using a Dexcom G7 CGM.  Sensor reading 281 at time of appointment.  She forgot her Lantus  last night as she fell asleep and she did not have syringes for the Humalog . She wishes to use her phone with the pump.  Her phone and PDM were programmed.  Start time:  1615   End time:  1725  Patient is here today for training on the Omnipod 5 Insulin  Pump.  It was confirmed that the patient was aware of the following: Blood glucose (BG) testing Treating hypoglycemia Hyperglycemia Carbohydrate counting   (Patient will be using carbohydrate counting with use of this pump.) Sick day management  Patient was instructed on the following: Have the following supplies available to be prepared: Omnipod PDM and pods Insulin  and syringe Blood glucose meter, strips, lancets, lancing device Glucose tablets/fast acting source of carbohydrate/Glucagon  Supply Reorder [facility] Customer Care [phone]  Reorder - info/contact number When to reorder (when last box of pods is opened)  System Overview Communication process/distance Storage guidelines Diagnostic tests (CT scans/MRI/Xrays) Travel guidelines  Pod: Fill port/adhesive/needle cap/pink slide insert/Waterproof  PDM App overview  PDM Settings Pump Therapy Order Form with settings below Basic settings - Personalized lock screen/time/time zone/date/date format Basal settings Max basal 2.4 Basal rates 1.2 Temp basal  ON Bolus settings Target Blood glucose  110 (factor preset and cannot have lower targets) Insulin  to carb (IC) ratio  8 Correction factor 30 mg/dL/unit for BG above 889 mg/dL Minimum blood glucose for bolus calculations 70 mg/dL Reverse correction ON Duration of insulin  action  3 hours Extended bolus ON Max bolus 20 units (preset Max bolus of 30 units) Patient  was able to accurately enter pump settings into PDM.  Pod Activation Change Pod  Room temperature insulin  Fill syringe - min/max amounts DO NOT prefill Pod Site selection/rotation & prep Automated cannula insertion - check infusion site/viewing window & pink slide insert Blood glucose reminder 1.5 hours after insertion When to change POD Patient was able to place pod and view insert.  Home Screen Status Bar, Menu Icon, Notification/Alarms Tabs Dashboard - IOB (if Calculator ON) - Instructed to leave calculator on Basal (Temp Basal if Temp Basal running) Pod Info - View Pod detail Last Bolus, Last BG Bolus button  Menu icon PDM function - Temp Basal, Pod, Enter BG, Suspend Manage Programs & Presets - Basal programs, temp basal presets, bolus presets Food Library ([employer] app, other tools for carbohydrate counting) History - Notifications & Alarms, Insulin  & BG History Settings - PDM Device, Pod sites, Reminders, Blood Glucose, Basal & Temp Basal, Bolus About  Advanced Features  Extended bolus Temp basal rate Additional basal programs Presets - Temp basal/Bolus presets  Troubleshooting Hypoglycemia Sick day management resource Hyperglycemia & Ketones  Notifications & Multimedia programmer reminders Pod Expiration alert Low Reservoir alert  Advisory & Hazard Alarms Advisory alarms - intermittent tones - response required Hazard alarm - Continuous vibration, and Tone - urgent attention required - Pod Expired, Empty Reservoir, Occlusion, Pod Error, Auto Off, PDM Error, System Error  Ongoing [employer] invitation provided Omnipod app(s) Register for [employer] 24/7 Customer Care  [phone] Reviewed User Guide Showed patient Customer's Bill of Rights and Responsibilities and instructed them to review.  Patient to contact me via MyChart or phone.

## 2024-08-19 NOTE — Telephone Encounter (Signed)
 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 They requested Change authorization requistion be faxed . Form completed and faxed. Rock Skip PEAK

## 2024-08-20 ENCOUNTER — Telehealth: Payer: Self-pay | Admitting: Dietician

## 2024-08-20 LAB — PANORAMA PRENATAL TEST FULL PANEL:PANORAMA TEST PLUS 5 ADDITIONAL MICRODELETIONS
TRISOMY 21 AGE-BASED RISK TEXT: 1.4
TRISOMY 21 RESULT TEXT: 2

## 2024-08-20 NOTE — Telephone Encounter (Signed)
 Omnipod 5 insulin  pump started at 5 pm on 08/19/2024.  Called patient 08/19/2024 at 8:15 pm. Patient has been bolusing.  Sensor reading 275 at the time of training and decreased to 171 last night.  She is getting ready to bolus and eat again.  She had no questions.  Called patient 08/20/2024 at 4:45 pm.  Patient states that she is not used to more normal blood glucose and has been feeling symptoms of hypoglycemia.  She verbalized that this is not a true low but her body getting used to normal.  Instructed her to suck on 1 regular hard candy if she feels like this and her symptoms should resolve.  Dexcom Clarity reviewed now: Fasting blood glucose:  118  Post meal glucose up to 275 after eating 2 donuts as she felt low .  She bolused but discussed that high fat foods take longer to digest and it takes longer to bring the blood glucose down. Sensor reading 251 now. Patient states she will drink more water and go for a walk.  She is unable to take another bolus at this time. Much improvement in Dexcom reading compared to prepump.  Will make no changes to the pump settings today as patient is getting used to being in range and the recommendation is not to make setting changes this close to starting the pump.  Initial POD uses the preprogrammed settings.  Next few PODS are in the learning phase.  Improved blood glucose control most likely after about 3-5 POD changes.  She states that she is to follow up with her NP Tuesday.  Will forward her this info as well.  Leita Constable, RD, LDN, CDCES, DipACLM

## 2024-08-24 ENCOUNTER — Telehealth (INDEPENDENT_AMBULATORY_CARE_PROVIDER_SITE_OTHER): Admitting: Obstetrics and Gynecology

## 2024-08-24 DIAGNOSIS — E11319 Type 2 diabetes mellitus with unspecified diabetic retinopathy without macular edema: Secondary | ICD-10-CM | POA: Insufficient documentation

## 2024-08-24 NOTE — Progress Notes (Signed)
 TELEHEALTH OBSTETRICS VISIT ENCOUNTER NOTE  FOR DIABETES MANAGEMENT DURING PREGNANCY    Provider location: [facility] for [facility]   Patient location: Home  I connected withNAME@ on 08/24/24 at  1:30 PM EDT by telephone at home and verified that I am speaking with the correct person using two identifiers. Of note, unable to do video encounter due to technical difficulties.    I discussed the limitations, risks, security and privacy concerns of performing an evaluation and management service by telephone and the availability of in person appointments. I also discussed with the patient that there may be a patient responsible charge related to this service. The patient expressed understanding and agreed to proceed.   History:   Denise [name] is a 33 y.o. [id number] at [redacted]w[redacted]d being seen today for diabetes management during pregnancy.   Patient reports no complaints. Reports fetal movement. Denies any contractions, bleeding or leaking of fluid.   The following portions of the patient's history were reviewed and updated as appropriate: allergies, current medications, past family history, past medical history, past social history, past surgical history and problem list.    Past Medical History:  Diagnosis Date   Allergy    Arthritis    Asthma    Gestational diabetes    History of cervical cerclage 07/28/2013   Rescue cerclage in place     Previous pregnancy with short cervix, but term delivery  This pregnancy, serial cervical lengths:  12/2 cervix long and closed  12/23 cervix 1.4 cm, vaginal progesterone  started  1/6 cervical length 0.2 cm, rescue cerclage was placed     Hx of chlamydia infection 2017   Pregnancy induced hypertension    Pre-E   Seasonal allergies    Trichomonas 2017   Past Surgical History:  Procedure Laterality Date   CERVICAL CERCLAGE N/A 11/23/2018   Procedure: CERCLAGE CERVICAL;  Surgeon: Fredirick Glenys RAMAN, MD;  Location: [facility] BIRTHING SUIT[facility];   Service: Gynecology;  Laterality: N/A;   DILATION AND CURETTAGE OF UTERUS  05/13/2022   Family History  Problem Relation Age of Onset   Arthritis Mother    Diabetes Mother    Diabetes Maternal Grandmother    Arthritis Maternal Grandmother    Social History   Tobacco Use   Smoking status: Never   Smokeless tobacco: Never  Vaping Use   Vaping status: Never Used  Substance Use Topics   Alcohol use: Not Currently   Drug use: No   Allergies  Allergen Reactions   Farxiga  [Dapagliflozin ] Nausea And Vomiting   Current Outpatient Medications on File Prior to Visit  Medication Sig Dispense Refill   albuterol  (VENTOLIN  HFA) 108 (90 Base) MCG/ACT inhaler Inhale 2 puffs into the lungs.     aspirin  EC 81 MG tablet Take 2 tablets (162 mg total) by mouth daily. Swallow whole. 60 tablet 12   Blood Glucose Monitoring Suppl (ACCU-CHEK GUIDE) w/Device KIT Use as advised 1 kit 0   Continuous Glucose Sensor (DEXCOM G7 SENSOR) MISC 1 each by Does not apply route daily. 3 each 3   cyclobenzaprine  (FLEXERIL ) 10 MG tablet Take 1 tablet (10 mg total) by mouth 2 (two) times daily as needed. 20 tablet 2   escitalopram (LEXAPRO) 20 MG tablet Take 20 mg by mouth.     glucose blood (ACCU-CHEK GUIDE) test strip Use as instructed 3-4x a day 300 each 3   Insulin  Disposable Pump (OMNIPOD 5 DEXG7G6 INTRO GEN 5) KIT Please call when you get RX  1 kit 0   Insulin  Disposable Pump (OMNIPOD 5 DEXG7G6 PODS GEN 5) MISC Use 1 POD every 2 days 15 each 11   insulin  glargine (LANTUS  SOLOSTAR) 100 UNIT/ML Solostar Pen Inject 70 Units into the skin daily. 60 units prior to bedtime and 10 units q AM. Take 12 hours apart. 30 day supply 21 mL 3   insulin  lispro (HUMALOG  KWIKPEN) 100 UNIT/ML KwikPen Inject 10 Units into the skin 3 (three) times daily. Inject 10 units with all meals, take 15-20 minutes before first bite.     insulin  lispro (HUMALOG ) 100 UNIT/ML injection Max dose 100 units per day. Used for omnipod insulin  pump,  please give 30 day supply 10 mL 11   Insulin  Pen Needle 32G X 4 MM MISC Use 4x a day 300 each 3   metFORMIN  (GLUCOPHAGE -XR) 500 MG 24 hr tablet Take 3 tablets (1,500 mg total) by mouth daily. 270 tablet 3   metoCLOPramide  (REGLAN ) 10 MG tablet Take 1 tablet (10 mg total) by mouth 4 (four) times daily -  before meals and at bedtime. 60 tablet 1   prochlorperazine  (COMPAZINE ) 10 MG tablet Take 1 tablet (10 mg total) by mouth every 6 (six) hours as needed for nausea or vomiting. 120 tablet 0   progesterone  200 MG SUPP Place 1 suppository (200 mg total) vaginally at bedtime. 30 suppository 1   promethazine  (PHENERGAN ) 25 MG tablet Take 1 tablet (25 mg total) by mouth every 6 (six) hours as needed. May insert vaginally, if unable to keep anything down by mouth 120 tablet 0   scopolamine  (TRANSDERM-SCOP) 1 MG/3DAYS Place 1 patch (1 mg total) onto the skin every 3 (three) days. 10 patch 0   No current facility-administered medications on file prior to visit.      Objective:   General:  Alert, oriented and cooperative.   Mental Status: Normal mood and affect perceived. Normal judgment and thought content.   The Rest of physical exam deferred due to type of encounter    Type 2 DM   Viability scan reassuring, vanishing twin.    Anatomy scan scheduled   Fetal Echo primary OB to schedule    Blood sugar monitoring    Now with Dexcom CGM in place.  Data sharing is set up with our office. Target range set to 65-140   Currently glucose in target range is 9% in target range, up from 0% last week  Entire baseline elevated. Fasting BS >120 Rapid drops in BS at night due to automated mode.      Current Diabetes medications:   Omnipod insulin  pump now in place. Switched from Automated mode to Manual mode today.   Basal rate set to 1.25 units per hour at all time points Carb ratio 1:8 Correction factor 30> will decrease to 25.   Plan:   Now on manual mode as of today BS will be high  during transitions from automated mode. Will be in touch over the next few days to adjust Basal rates and initiate different time zones for basal rates.  She has stopped metformin , will hold for now.   -Avoid post meal coverage due hypoglycemia.  Do not bolus unless you are eating a meal or a snack.    -Discussed only 30-40 grams of carbohydrates with each meal, with majority of your meals being protein and high fiber vegetables.    -Start a fiber supplement everyday.    -Protein snack before bed. Recommend 10-20 grams of protein before bed.    -  Anticipate weekly diabetes visits until BS's are better controlled -Anticipate frequent in person OB visits along with MFM care.  -q4 week growth US  starting at 20w with antenatal weekly testing starting at 32 weeks or sooner if needed.    -Continue BASA 81 mg      This visit is for the purposes of diabetes management only. Please keep scheduled OB visits with OB team for prenatal care.    Preterm labor symptoms and general obstetric precautions including but not limited to vaginal bleeding, contractions, leaking of fluid and fetal movement were reviewed in detail with the patient.  I discussed the assessment and treatment plan with the patient. The patient was provided an opportunity to ask questions and all were answered. The patient agreed with the plan and demonstrated an understanding of the instructions. The patient was advised to call back or seek an in-person office evaluation/go to [facility] at [facility] for any urgent or concerning symptoms. Please refer to After Visit Summary for other counseling recommendations.    I provided 15 minutes of non-face-to-face time during this encounter.   Martie Muhlbauer, Delon FERNS, NP [facility] for [employer], [facility]

## 2024-08-25 ENCOUNTER — Encounter (HOSPITAL_COMMUNITY): Payer: Self-pay | Admitting: *Deleted

## 2024-08-25 ENCOUNTER — Telehealth: Payer: Self-pay

## 2024-08-25 ENCOUNTER — Ambulatory Visit: Admitting: Certified Nurse Midwife

## 2024-08-25 ENCOUNTER — Telehealth (HOSPITAL_COMMUNITY): Payer: Self-pay | Admitting: *Deleted

## 2024-08-25 VITALS — BP 130/85 | HR 104

## 2024-08-25 DIAGNOSIS — F419 Anxiety disorder, unspecified: Secondary | ICD-10-CM | POA: Diagnosis not present

## 2024-08-25 DIAGNOSIS — Z8751 Personal history of pre-term labor: Secondary | ICD-10-CM

## 2024-08-25 DIAGNOSIS — F32A Depression, unspecified: Secondary | ICD-10-CM | POA: Diagnosis not present

## 2024-08-25 DIAGNOSIS — Z3A13 13 weeks gestation of pregnancy: Secondary | ICD-10-CM | POA: Diagnosis not present

## 2024-08-25 DIAGNOSIS — O0991 Supervision of high risk pregnancy, unspecified, first trimester: Secondary | ICD-10-CM | POA: Diagnosis not present

## 2024-08-25 DIAGNOSIS — O3110X0 Continuing pregnancy after spontaneous abortion of one fetus or more, unspecified trimester, not applicable or unspecified: Secondary | ICD-10-CM

## 2024-08-25 DIAGNOSIS — O3111X0 Continuing pregnancy after spontaneous abortion of one fetus or more, first trimester, not applicable or unspecified: Secondary | ICD-10-CM | POA: Diagnosis not present

## 2024-08-25 NOTE — Telephone Encounter (Signed)
 Preadmission screen Mychart message sent regarding pre procedure instructions.  Pt expressed concern regarding Dr alger doing the cerclage.  I encouraged her to send a message to [facility].  Pt is to take lexapro the night before the cerclage since she says she cannot take it on an empty stomach per Dr Lola.    All other questions and concerns addressed with verbalized understanding.

## 2024-08-25 NOTE — Telephone Encounter (Signed)
 RN attempted to call patient per request of the provider. No voicemail, unable to leave message.  Silvano LELON Piano, RN

## 2024-08-26 ENCOUNTER — Telehealth: Payer: Self-pay | Admitting: Dietician

## 2024-08-26 NOTE — Telephone Encounter (Signed)
 Patient called and spoke with me on 08/25/2024. She verbalized concern that her NP has changed her pump control to Manual mode.  Dexcom clarity and chart reviewed and her sensor readings remain very high.  Delon Emms, NP communicated with the RD that she will be managing Denise [name]'s blood glucose during her pregnancy.  Patient has my contact information and is to call for questions.  Leita Constable, RD, LDN, CDCES, DipACLM
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
## 2024-08-27 ENCOUNTER — Encounter (HOSPITAL_COMMUNITY): Payer: Self-pay

## 2024-08-27 NOTE — Telephone Encounter (Addendum)
 RN called pt to schedule lab visit per Vannie, CNM.  Pt agreeable to lab visit 09/01/24 at 10:20am.  Pt states she's having a procedure on Monday so she elected for Wednesday.  Pt verbalized understanding of appointment and had no questions at this time.    Waddell, RN  ----- Message from Cornell JONELLE Vannie sent at 08/26/2024 12:09 PM EDT ----- Please call to get patient scheduled for another lab visit asap - I put in a vitamin D and she needs her Panorama redrawn since she has low fetal fraction this time. Please make note to confirm she  has a vanishing twin on the order when it's drawn. Thank you! ----- Message ----- From: Interface, [facility] Lab Results In Sent: 08/05/2024   1:36 PM EDT To: Cornell JONELLE Vannie, CNM

## 2024-08-30 ENCOUNTER — Other Ambulatory Visit: Payer: Self-pay

## 2024-08-30 ENCOUNTER — Ambulatory Visit (HOSPITAL_COMMUNITY)

## 2024-08-30 ENCOUNTER — Encounter (HOSPITAL_COMMUNITY)
Admission: RE | Disposition: A | Discharge status: Home / Self Care | Payer: Self-pay | Source: Home / Self Care | Attending: Obstetrics and Gynecology

## 2024-08-30 ENCOUNTER — Ambulatory Visit (HOSPITAL_COMMUNITY)
Admission: RE | Admit: 2024-08-30 | Discharge: 2024-08-30 | Disposition: A | Discharge status: Home / Self Care | Attending: Obstetrics and Gynecology | Admitting: Obstetrics and Gynecology

## 2024-08-30 ENCOUNTER — Encounter (HOSPITAL_COMMUNITY): Payer: Self-pay | Admitting: Obstetrics and Gynecology

## 2024-08-30 DIAGNOSIS — N883 Incompetence of cervix uteri: Secondary | ICD-10-CM | POA: Diagnosis present

## 2024-08-30 DIAGNOSIS — O09292 Supervision of pregnancy with other poor reproductive or obstetric history, second trimester: Secondary | ICD-10-CM

## 2024-08-30 DIAGNOSIS — J45909 Unspecified asthma, uncomplicated: Secondary | ICD-10-CM | POA: Insufficient documentation

## 2024-08-30 DIAGNOSIS — O3432 Maternal care for cervical incompetence, second trimester: Secondary | ICD-10-CM | POA: Diagnosis not present

## 2024-08-30 DIAGNOSIS — O09293 Supervision of pregnancy with other poor reproductive or obstetric history, third trimester: Secondary | ICD-10-CM

## 2024-08-30 DIAGNOSIS — Z3A13 13 weeks gestation of pregnancy: Secondary | ICD-10-CM | POA: Insufficient documentation

## 2024-08-30 DIAGNOSIS — O0990 Supervision of high risk pregnancy, unspecified, unspecified trimester: Secondary | ICD-10-CM

## 2024-08-30 DIAGNOSIS — O09291 Supervision of pregnancy with other poor reproductive or obstetric history, first trimester: Secondary | ICD-10-CM | POA: Insufficient documentation

## 2024-08-30 DIAGNOSIS — Z8751 Personal history of pre-term labor: Secondary | ICD-10-CM

## 2024-08-30 DIAGNOSIS — I10 Essential (primary) hypertension: Secondary | ICD-10-CM | POA: Insufficient documentation

## 2024-08-30 DIAGNOSIS — O99511 Diseases of the respiratory system complicating pregnancy, first trimester: Secondary | ICD-10-CM | POA: Diagnosis not present

## 2024-08-30 DIAGNOSIS — O3431 Maternal care for cervical incompetence, first trimester: Secondary | ICD-10-CM | POA: Diagnosis not present

## 2024-08-30 HISTORY — PX: CERVICAL CERCLAGE: SHX1329

## 2024-08-30 LAB — CBC
HCT: 38.4 % (ref 36.0–46.0)
Hemoglobin: 12.6 g/dL (ref 12.0–15.0)
MCH: 25.6 pg — ABNORMAL LOW (ref 26.0–34.0)
MCHC: 32.8 g/dL (ref 30.0–36.0)
MCV: 77.9 fL — ABNORMAL LOW (ref 80.0–100.0)
Platelets: 268 K/uL (ref 150–400)
RBC: 4.93 MIL/uL (ref 3.87–5.11)
RDW: 14 % (ref 11.5–15.5)
WBC: 4.6 K/uL (ref 4.0–10.5)
nRBC: 0 % (ref 0.0–0.2)

## 2024-08-30 LAB — TYPE AND SCREEN
ABO/RH(D): B POS
Antibody Screen: NEGATIVE

## 2024-08-30 LAB — GLUCOSE, CAPILLARY: Glucose-Capillary: 112 mg/dL — ABNORMAL HIGH (ref 70–99)

## 2024-08-30 SURGERY — CERCLAGE, CERVIX, VAGINAL APPROACH
Anesthesia: Choice

## 2024-08-30 MED ORDER — ONDANSETRON HCL 4 MG/2ML IJ SOLN: 4.0000 mg | Freq: Once | INTRAMUSCULAR | Status: DC | PRN

## 2024-08-30 MED ORDER — LACTATED RINGERS IV SOLN
INTRAVENOUS | Status: DC | PRN
Start: 2024-08-30 — End: 2024-08-30

## 2024-08-30 MED ORDER — ONDANSETRON HCL 4 MG/2ML IJ SOLN
INTRAMUSCULAR | Status: DC | PRN
  Administered 2024-08-30: 4 mg via INTRAVENOUS

## 2024-08-30 MED ORDER — POVIDONE-IODINE 10 % EX SWAB
2.0000 | Freq: Once | CUTANEOUS | Status: AC
  Administered 2024-08-30: 2 via TOPICAL

## 2024-08-30 MED ORDER — MEPERIDINE HCL 25 MG/ML IJ SOLN: 6.2500 mg | INTRAMUSCULAR | Status: DC | PRN

## 2024-08-30 MED ORDER — FENTANYL CITRATE (PF) 100 MCG/2ML IJ SOLN: 25.0000 ug | INTRAMUSCULAR | Status: DC | PRN

## 2024-08-30 MED ORDER — CHLOROPROCAINE HCL (PF) 3 % IJ SOLN
INTRAMUSCULAR | Status: DC | PRN
  Administered 2024-08-30: 1.1 mL via INTRATHECAL

## 2024-08-30 MED ORDER — OXYCODONE HCL 5 MG/5ML PO SOLN: 5.0000 mg | Freq: Once | ORAL | Status: DC | PRN

## 2024-08-30 MED ORDER — FENTANYL CITRATE (PF) 100 MCG/2ML IJ SOLN
INTRAMUSCULAR | Status: AC
  Filled 2024-08-30: qty 2

## 2024-08-30 MED ORDER — BUPIVACAINE HCL 0.25 % IJ SOLN
INTRAMUSCULAR | Status: DC | PRN
  Administered 2024-08-30: 30 mL

## 2024-08-30 MED ORDER — FENTANYL CITRATE (PF) 100 MCG/2ML IJ SOLN
INTRAMUSCULAR | Status: DC | PRN
  Administered 2024-08-30: 25 ug via INTRATHECAL

## 2024-08-30 MED ORDER — BUPIVACAINE HCL (PF) 0.25 % IJ SOLN
INTRAMUSCULAR | Status: AC
  Filled 2024-08-30: qty 30

## 2024-08-30 MED ORDER — OXYCODONE HCL 5 MG PO TABS: 5.0000 mg | ORAL_TABLET | Freq: Once | ORAL | Status: DC | PRN

## 2024-08-30 SURGICAL SUPPLY — 14 items
CANISTER SUCT 3000ML PPV (MISCELLANEOUS) ×2 IMPLANT
GLOVE BIOGEL PI IND STRL 6.5 (GLOVE) ×1 IMPLANT
GLOVE BIOGEL PI IND STRL 7.0 (GLOVE) ×1 IMPLANT
GLOVE SURG SS PI 6.5 STRL IVOR (GLOVE) ×1 IMPLANT
GOWN STRL REUS W/TWL LRG LVL3 (GOWN DISPOSABLE) ×2 IMPLANT
NS IRRIG 1000ML POUR BTL (IV SOLUTION) ×1 IMPLANT
PACK VAGINAL MINOR WOMEN LF (CUSTOM PROCEDURE TRAY) ×1 IMPLANT
PAD OB MATERNITY 4.3X12.25 (PERSONAL CARE ITEMS) ×1 IMPLANT
PAD PREP 24X48 CUFFED NSTRL (MISCELLANEOUS) ×1 IMPLANT
SUT PROLENE 0 CT 1 30 (SUTURE) ×1 IMPLANT
TOWEL OR 17X24 6PK STRL BLUE (TOWEL DISPOSABLE) ×2 IMPLANT
TRAY FOLEY W/BAG SLVR 14FR (SET/KITS/TRAYS/PACK) ×1 IMPLANT
TUBING NON-CON 1/4 X 20 CONN (TUBING) ×1 IMPLANT
YANKAUER SUCT BULB TIP NO VENT (SUCTIONS) ×1 IMPLANT

## 2024-08-30 NOTE — Anesthesia Procedure Notes (Signed)
 Spinal  Patient location during procedure: OR Start time: 08/30/2024 12:00 PM End time: 08/30/2024 12:04 PM Reason for block: surgical anesthesia Staffing Performed: other anesthesia staff  Anesthesiologist: Mallory Manus, MD Performed by: Mallory Manus, MD Authorized by: Mallory Manus, MD   Preanesthetic Checklist Completed: patient identified, IV checked, site marked, risks and benefits discussed, surgical consent, monitors and equipment checked, pre-op evaluation and timeout performed Spinal Block Patient position: sitting Prep: DuraPrep Patient monitoring: heart rate, cardiac monitor, continuous pulse ox and blood pressure Approach: midline Location: L4-5 Injection technique: single-shot Needle Needle type: Sprotte  Needle gauge: 24 G Needle length: 9 cm Assessment Sensory level: T4 Events: CSF return

## 2024-08-30 NOTE — OR Nursing (Signed)
 Dc instructions given to pt no questions asked no bleeding noted at present  Dc home without concerns

## 2024-08-30 NOTE — Anesthesia Procedure Notes (Signed)
 Spinal  Patient location during procedure: OR Start time: 08/30/2024 11:58 AM End time: 08/30/2024 12:00 PM Reason for block: surgical anesthesia Staffing Other anesthesia staff: Pecolia Jayson SQUIBB, RN Performed by: Edelmiro Elenor NOVAK, CRNA Authorized by: Mallory Manus, MD

## 2024-08-30 NOTE — H&P (Signed)
 Denise [name] is a 33 y.o. female [phone] at [redacted]w[redacted]d presenting for scheduled prophylactic cerclage placement. Patient reports feeling well and denies vaginal bleeding or cramping. She started prenatal care at 10 wk at [facility]. Prenatal care complicated by history of short cervix which required a cerclage in a previous pregnancy, insulin - dependent type 2 DM and history of preeclampsia  OB History     Gravida  4   Para  2   Term  1   Preterm  1   AB  1   Living  2      SAB  1   IAB      Ectopic      Multiple      Live Births  2          Past Medical History:  Diagnosis Date   Allergy    Arthritis    Asthma    Carpal tunnel syndrome of left wrist    Gestational diabetes    History of cervical cerclage 07/28/2013   Rescue cerclage in place     Previous pregnancy with short cervix, but term delivery  This pregnancy, serial cervical lengths:  12/2 cervix long and closed  12/23 cervix 1.4 cm, vaginal progesterone  started  1/6 cervical length 0.2 cm, rescue cerclage was placed     History of pre-eclampsia in prior pregnancy, currently pregnant    Hx of chlamydia infection 2017   Pregnancy induced hypertension    Pre-E   Seasonal allergies    Trichomonas 2017   Past Surgical History:  Procedure Laterality Date   CERVICAL CERCLAGE N/A 11/23/2018   Procedure: CERCLAGE CERVICAL;  Surgeon: Fredirick Glenys RAMAN, MD;  Location: [facility] BIRTHING SUITES;  Service: Gynecology;  Laterality: N/A;   DILATION AND CURETTAGE OF UTERUS  05/13/2022   Family History: family history includes Arthritis in her maternal grandmother and mother; Diabetes in her maternal grandmother and mother. Social History:  reports that she has never smoked. She has never used smokeless tobacco. She reports that she does not currently use alcohol. She reports that she does not use drugs.    Review of Systems See pertinent in HPI. All other systems reviewed and non contributory    Pulse 76, temperature 98.6  F (37 C), temperature source Oral, height 5' 5.5 (1.664 m), weight 97.5 kg, last menstrual period 05/25/2024, unknown if currently breastfeeding. Exam Physical Exam   GENERAL: Well-developed, well-nourished female in no acute distress.  LUNGS: Clear to auscultation bilaterally.  HEART: Regular rate and rhythm. ABDOMEN: Soft, nontender, nondistended. No organomegaly. PELVIC: Deferred to OR EXTREMITIES: No cyanosis, clubbing, or edema, 2+ distal pulses.  Prenatal labs: ABO, Rh: B/Positive/-- (09/17 1125) Antibody: Negative (09/17 1125) Rubella: 22.00 (09/17 1125) RPR: Non Reactive (09/17 1125)  HBsAg: Negative (09/17 1125)  HIV: Non Reactive (09/17 1125)  GBS:     Assessment/Plan: 33 yo [id number] at [redacted]w[redacted]d with history of short cervix here for scheduled cerclage placement - Risks, benefits and alternatives were explained including but not limited to risks of bleeding, infection, rupture of membrane and damage to adjacent organs - Patient verbalized understanding and all questions were answered   Denise [name] 08/30/2024, 10:42 AM

## 2024-08-30 NOTE — Op Note (Signed)
 Denise [name] Her   PROCEDURE DATE: 08/30/2024  PREOPERATIVE DIAGNOSIS: Intrauterine pregnancy at [redacted]w[redacted]d, history of cervical incompetence   POSTOPERATIVE DIAGNOSIS: The same PROCEDURE: Transvaginal McDonald Cervical Cerclage Placement SURGEON:  Dr. Winton Felt  INDICATIONS: 33 y.o. [id number] at [redacted]w[redacted]d with history of cervical incompetence, here for cerclage placement.   The risks of surgery were discussed in detail with the patient including but not limited to: bleeding; infection which may require antibiotic therapy; injury to cervix, vagina other surrounding organs; risk of ruptured membranes and/or preterm delivery and other postoperative or anesthesia complications.  Written informed consent was obtained.    FINDINGS:  About 3 cm palpable cervical length in the vagina, closed cervix, suture knot placed anteriorly.  ANESTHESIA:  Spinal INTRAVENOUS FLUIDS: 500  ml ESTIMATED BLOOD LOSS: 20 ml COMPLICATIONS: None immediate  PROCEDURE IN DETAIL:  The patient had sequential compression devices applied to her lower extremities while in the preoperative area.  Reassuring fetal heart rate was also obtained using a doppler. She was then taken to the operating room where spinal anesthesia was administered and was found to be adequate.  She was placed in the dorsal lithotomy, and was prepped and draped in a sterile manner. Her bladder was catheterized for an unmeasured amount of clear, yellow urine.  After an adequate timeout was performed, a vaginal speculum was then placed in the patient's vagina and a single tooth tenaculum was applied to the anterior lip of the cervix.  A paracervical block using 1% Marcaine  was administered.  The anterior and posterior lips of the cervix was grasped with ring forceps. A curved needle loaded with a number 1 Prolene suture was inserted at 12 o'clock, as high as possible at the junction of the rugated vaginal epithelium and the smooth cervix, at least 2 cm above the external  os.  Four bites are taken circumferentially around the entire cervix in a purse-string fashion, each bite should be deep enough to extend at least midway into the cervical stroma, but not into the endocervical canal. The two ends of the suture were then tied securely anteriorly and cut, leaving the ends long enough to grasp with a clamp when it is time to remove it. There was minimal bleeding noted and the ring forceps were removed with good hemostasis noted.  All instruments were removed from the patient's vagina.  Instrument, needle and sponge counts were correct x 2. The patient tolerated the procedure well, and was taken to the recovery area awake and in stable condition. Reassuring fetal heart rate and fetal movement was also obtained using a bedside ultrasound in the operating room.  The patient will be discharged to home as per PACU criteria.  Routine postoperative instructions given.    She will follow up in the clinic on 09/08/24 for postoperative evaluation and ongoing prenatal care

## 2024-08-30 NOTE — Transfer of Care (Signed)
 Immediate Anesthesia Transfer of Care Note  Patient: Denise [name]  Procedure(s) Performed: CERCLAGE, CERVIX, VAGINAL APPROACH  Patient Location: PACU  Anesthesia Type:Spinal  Level of Consciousness: awake, alert , and oriented  Airway & Oxygen Therapy: Patient Spontanous Breathing  Post-op Assessment: Report given to RN and Post -op Vital signs reviewed and stable  Post vital signs: Reviewed and stable  Last Vitals:  Vitals Value Taken Time  BP 120/77 08/30/24 12:47  Temp    Pulse 66 08/30/24 12:48  Resp 21 08/30/24 12:48  SpO2 98 % 08/30/24 12:48  Vitals shown include unfiled device data.  Last Pain:  Vitals:   08/30/24 1028  TempSrc: Oral  PainSc: 0-No pain         Complications: No notable events documented.

## 2024-08-30 NOTE — Anesthesia Preprocedure Evaluation (Signed)
 Anesthesia Evaluation  Patient identified by MRN, date of birth, ID band Patient awake    Reviewed: Allergy & Precautions, H&P , NPO status , Patient's Chart, lab work & pertinent test results, reviewed documented beta blocker date and time   Airway Mallampati: II  TM Distance: >3 FB Neck ROM: full    Dental no notable dental hx.    Pulmonary neg pulmonary ROS   Pulmonary exam normal breath sounds clear to auscultation       Cardiovascular hypertension, negative cardio ROS Normal cardiovascular exam Rhythm:regular Rate:Normal     Neuro/Psych negative neurological ROS  negative psych ROS   GI/Hepatic negative GI ROS, Neg liver ROS,,,  Endo/Other  negative endocrine ROSdiabetes    Renal/GU negative Renal ROS  negative genitourinary   Musculoskeletal   Abdominal   Peds  Hematology negative hematology ROS (+)   Anesthesia Other Findings   Reproductive/Obstetrics (+) Pregnancy                              Anesthesia Physical Anesthesia Plan  ASA: 2  Anesthesia Plan: Spinal   Post-op Pain Management:    Induction:   PONV Risk Score and Plan:   Airway Management Planned:   Additional Equipment:   Intra-op Plan:   Post-operative Plan:   Informed Consent: I have reviewed the patients History and Physical, chart, labs and discussed the procedure including the risks, benefits and alternatives for the proposed anesthesia with the patient or authorized representative who has indicated his/her understanding and acceptance.       Plan Discussed with:   Anesthesia Plan Comments:         Anesthesia Quick Evaluation

## 2024-08-30 NOTE — Anesthesia Postprocedure Evaluation (Signed)
 Anesthesia Post Note  Patient: Denise [name]  Procedure(s) Performed: CERCLAGE, CERVIX, VAGINAL APPROACH     Patient location during evaluation: PACU Anesthesia Type: Spinal Level of consciousness: oriented and awake and alert Pain management: pain level controlled Vital Signs Assessment: post-procedure vital signs reviewed and stable Respiratory status: spontaneous breathing, respiratory function stable and patient connected to nasal cannula oxygen Cardiovascular status: blood pressure returned to baseline and stable Postop Assessment: no headache, no backache and no apparent nausea or vomiting Anesthetic complications: no   No notable events documented.  Last Vitals:  Vitals:   08/30/24 1330 08/30/24 1345  BP: 131/74   Pulse: 73   Resp: 18   Temp:  36.8 C  SpO2: 99%     Last Pain:  Vitals:   08/30/24 1345  TempSrc: Oral  PainSc: 0-No pain                 Amia Rynders

## 2024-08-31 ENCOUNTER — Telehealth: Payer: Self-pay

## 2024-08-31 ENCOUNTER — Encounter (HOSPITAL_COMMUNITY): Payer: Self-pay | Admitting: Obstetrics and Gynecology

## 2024-08-31 ENCOUNTER — Telehealth: Admitting: Obstetrics and Gynecology

## 2024-08-31 DIAGNOSIS — O09892 Supervision of other high risk pregnancies, second trimester: Secondary | ICD-10-CM

## 2024-08-31 DIAGNOSIS — E11319 Type 2 diabetes mellitus with unspecified diabetic retinopathy without macular edema: Secondary | ICD-10-CM

## 2024-08-31 DIAGNOSIS — O24912 Unspecified diabetes mellitus in pregnancy, second trimester: Secondary | ICD-10-CM | POA: Diagnosis not present

## 2024-08-31 NOTE — Progress Notes (Signed)
 TELEHEALTH OBSTETRICS VISIT ENCOUNTER NOTE  FOR DIABETES MANAGEMENT DURING PREGNANCY    Provider location: [facility] for [facility] [place]   Patient location: Home  I connected Denise [name] on 08/31/24 at  1:30 PM EDT by telephone at home and verified that I am speaking with the correct person using two identifiers. Of note, unable to do video encounter due to technical difficulties.    I discussed the limitations, risks, security and privacy concerns of performing an evaluation and management service by telephone and the availability of in person appointments. I also discussed with the patient that there may be a patient responsible charge related to this service. The patient expressed understanding and agreed to proceed.   History:   Denise [name] is a 33 y.o. [id number] at [redacted]w[redacted]d being seen today for diabetes management during pregnancy.   Patient reports no complaints. Reports fetal movement. Denies any contractions, bleeding or leaking of fluid.   The following portions of the patient's history were reviewed and updated as appropriate: allergies, current medications, past family history, past medical history, past social history, past surgical history and problem list.    Past Medical History:  Diagnosis Date   Allergy    Arthritis    Asthma    Carpal tunnel syndrome of left wrist    Gestational diabetes    History of cervical cerclage 07/28/2013   Rescue cerclage in place     Previous pregnancy with short cervix, but term delivery  This pregnancy, serial cervical lengths:  12/2 cervix long and closed  12/23 cervix 1.4 cm, vaginal progesterone  started  1/6 cervical length 0.2 cm, rescue cerclage was placed     History of pre-eclampsia in prior pregnancy, currently pregnant    Hx of chlamydia infection 2017   Pregnancy induced hypertension    Pre-E   Seasonal allergies    Trichomonas 2017   Past Surgical History:  Procedure Laterality Date   CERVICAL  CERCLAGE N/A 11/23/2018   Procedure: CERCLAGE CERVICAL;  Surgeon: Fredirick Glenys RAMAN, MD;  Location: [facility] BIRTHING SUITES;  Service: Gynecology;  Laterality: N/A;   CERVICAL CERCLAGE N/A 08/30/2024   Procedure: CERCLAGE, CERVIX, VAGINAL APPROACH;  Surgeon: Alger Gong, MD;  [facility];  Service: Obstetrics;  Laterality: N/A;   DILATION AND CURETTAGE OF UTERUS  05/13/2022   Family History  Problem Relation Age of Onset   Arthritis Mother    Diabetes Mother    Diabetes Maternal Grandmother    Arthritis Maternal Grandmother    Social History   Tobacco Use   Smoking status: Never   Smokeless tobacco: Never  Vaping Use   Vaping status: Never Used  Substance Use Topics   Alcohol use: Not Currently   Drug use: No   Allergies  Allergen Reactions   Farxiga  [Dapagliflozin ] Nausea And Vomiting   Current Outpatient Medications on File Prior to Visit  Medication Sig Dispense Refill   Blood Glucose Monitoring Suppl (ACCU-CHEK GUIDE) w/Device KIT Use as advised 1 kit 0   Continuous Glucose Sensor (DEXCOM G7 SENSOR) MISC 1 each by Does not apply route daily. 3 each 3   cyclobenzaprine  (FLEXERIL ) 10 MG tablet Take 1 tablet (10 mg total) by mouth 2 (two) times daily as needed. 20 tablet 2   escitalopram (LEXAPRO) 20 MG tablet Take 20 mg by mouth daily.     glucose blood (ACCU-CHEK GUIDE) test strip Use as instructed 3-4x a day 300 each 3   Insulin  Disposable Pump (  OMNIPOD 5 [id number] PODS GEN 5) MISC Use 1 POD every 2 days 15 each 11   insulin  lispro (HUMALOG ) 100 UNIT/ML injection Max dose 100 units per day. Used for omnipod insulin  pump, please give 30 day supply 10 mL 11   Insulin  Pen Needle 32G X 4 MM MISC Use 4x a day 300 each 3   progesterone  (PROMETRIUM ) 200 MG capsule 400 mg See admin instructions. Taking 200mg  orally at bedtime AND inserting a 200mg  capsule vaginally at bedtime.     aspirin  EC 81 MG tablet Take 2 tablets (162 mg total) by mouth daily. Swallow whole. (Patient not  taking: Reported on 08/31/2024) 60 tablet 12   insulin  glargine (LANTUS  SOLOSTAR) 100 UNIT/ML Solostar Pen Inject 70 Units into the skin daily. 60 units prior to bedtime and 10 units q AM. Take 12 hours apart. 30 day supply (Patient not taking: Reported on 08/31/2024) 21 mL 3   insulin  lispro (HUMALOG  KWIKPEN) 100 UNIT/ML KwikPen Inject 10 Units into the skin 3 (three) times daily. Inject 10 units with all meals, take 15-20 minutes before first bite. (Patient not taking: Reported on 08/31/2024)     metFORMIN  (GLUMETZA ) 1000 MG (MOD) 24 hr tablet Take 2,000 mg by mouth daily with breakfast. (Patient not taking: Reported on 08/31/2024)     metoCLOPramide  (REGLAN ) 10 MG tablet Take 1 tablet (10 mg total) by mouth 4 (four) times daily -  before meals and at bedtime. (Patient not taking: Reported on 08/31/2024) 60 tablet 1   prochlorperazine  (COMPAZINE ) 10 MG tablet Take 1 tablet (10 mg total) by mouth every 6 (six) hours as needed for nausea or vomiting. (Patient not taking: Reported on 08/31/2024) 120 tablet 0   promethazine  (PHENERGAN ) 25 MG tablet Take 1 tablet (25 mg total) by mouth every 6 (six) hours as needed. May insert vaginally, if unable to keep anything down by mouth (Patient not taking: Reported on 08/31/2024) 120 tablet 0   scopolamine  (TRANSDERM-SCOP) 1 MG/3DAYS Place 1 patch (1 mg total) onto the skin every 3 (three) days. (Patient not taking: Reported on 08/31/2024) 10 patch 0   No current facility-administered medications on file prior to visit.      Objective:   General:  Alert, oriented and cooperative.   Mental Status: Normal mood and affect perceived. Normal judgment and thought content.   The Rest of physical exam deferred due to type of encounter  Type 2 DM   Anatomy scan: scheduled    Fetal Echo primary OB to schedule.   Had cerclage placed this week, doing well    Blood sugar monitoring: she has dexcom in place with data sharing set up.    With CGM, target Blood  sugar set at 65-140.   Currently glucose in target range is 20% in target range   Back up glucose monitor she has at home.     Fasting BS 120-150   2 hour PP all greater than 120  Overall baseline elevated.      Current Diabetes medications:   Omnipod in place in manual mode.  Change from auto mode last week.   1.35 units per hour 3 am- 9 am  1.45 units per hour. 9am-9pm 1.35 units per hour 9 pm- 12 am  1.45 units per hour 12 am- 3 am.   Correction factor is set to 20 Carb ratio is 1:8 Target BS is 110   Plan:   -Continue current regimen.  -Will make changes over the next few days.   -  Recommend dosing 15- 20 minutes prior to eating.    -Avoid post meal coverage due hypoglycemia.    -Discussed only 30-40 grams of carbohydrates with each meal, with majority of your meals being protein and high fiber vegetables.    -Start a fiber supplement everyday.    -Protein snack before bed. Recommend 10-20 grams of protein before bed.    -Anticipate weekly diabetes visits until BS's are better controlled -Anticipate frequent in person OB visits along with MFM care.  -q4 week growth US  starting at 20w with antenatal weekly testing starting at 32 weeks or sooner if needed.    -Continue BASA 81 mg      This visit is for the purposes of diabetes management only. Please keep scheduled OB visits with OB team for prenatal care.    Preterm labor symptoms and general obstetric precautions including but not limited to vaginal bleeding, contractions, leaking of fluid and fetal movement were reviewed in detail with the patient.  I discussed the assessment and treatment plan with the patient. The patient was provided an opportunity to ask questions and all were answered. The patient agreed with the plan and demonstrated an understanding of the instructions. The patient was advised to call back or seek an in-person office evaluation/go to MAU at [facility] for any urgent or  concerning symptoms. Please refer to After Visit Summary for other counseling recommendations.    I provided 15 minutes of non-face-to-face time during this encounter.  Fitzhugh Vizcarrondo, Delon FERNS, NP Faculty Practice Center for [employer], [facility]

## 2024-08-31 NOTE — Telephone Encounter (Signed)
 RN spoke with Kathern at MFM to assist with scheduling anatomy ultrasound per request of Delon Emms, NP. Patient scheduled for 10/08/24 at 9:00am in [place], provider notified.  Silvano LELON Piano, RN

## 2024-09-01 ENCOUNTER — Other Ambulatory Visit: Payer: Self-pay

## 2024-09-01 ENCOUNTER — Other Ambulatory Visit

## 2024-09-01 ENCOUNTER — Encounter: Admitting: Certified Nurse Midwife

## 2024-09-01 DIAGNOSIS — Z3402 Encounter for supervision of normal first pregnancy, second trimester: Secondary | ICD-10-CM

## 2024-09-01 DIAGNOSIS — O0990 Supervision of high risk pregnancy, unspecified, unspecified trimester: Secondary | ICD-10-CM

## 2024-09-02 ENCOUNTER — Telehealth: Payer: Self-pay

## 2024-09-02 LAB — VITAMIN D 25 HYDROXY (VIT D DEFICIENCY, FRACTURES): Vit D, 25-Hydroxy: 28.4 ng/mL — ABNORMAL LOW (ref 30.0–100.0)

## 2024-09-02 NOTE — Telephone Encounter (Signed)
 [facility] incoming fax asking if you can sned her Insulin  Lipro (Humalog ) 100 unit/ML injection to fill for 200 units per day so insurance will pay.   Will route to provider to resend prescriptions.    Ermalinda GRADE CMA

## 2024-09-03 ENCOUNTER — Telehealth: Payer: Self-pay | Admitting: Obstetrics and Gynecology

## 2024-09-03 ENCOUNTER — Other Ambulatory Visit: Payer: Self-pay | Admitting: Obstetrics and Gynecology

## 2024-09-03 MED ORDER — INSULIN LISPRO 100 UNIT/ML IJ SOLN: INTRAMUSCULAR | 11 refills | Status: DC

## 2024-09-03 NOTE — Telephone Encounter (Signed)
 [name] sent a [employer] with concerns that she has run out of insulin  and pharmacy is unable to fill.   Pharmacy contacted and medicaid override was required.  [name] is now with insulin  and omnipod is back on.  Settings adjusted below  Basal rates Omnipod in place in manual mode.   1.35 units per hour 3 am- 9 am> increase to 1.40 units per hour. 1.45 units per hour. 9am-9pm> Increase to 1.55 units per hour 1.35 units per hour 9 pm- 12 am> increase to 1.40 units per hour  1.45 units per hour 12 am- 3 am.> Increase to 1.55 units per hour.   Correction factor is set to 20> decrease to 18 Carb ratio is 1:8 Target BS is 110    She has follow up with me next week.  Dorita Delon FERNS, NP 09/03/2024 6:32 PM

## 2024-09-06 DIAGNOSIS — O30042 Twin pregnancy, dichorionic/diamniotic, second trimester: Secondary | ICD-10-CM | POA: Insufficient documentation

## 2024-09-06 DIAGNOSIS — O3110X0 Continuing pregnancy after spontaneous abortion of one fetus or more, unspecified trimester, not applicable or unspecified: Secondary | ICD-10-CM | POA: Insufficient documentation

## 2024-09-07 ENCOUNTER — Telehealth: Admitting: Obstetrics and Gynecology

## 2024-09-07 DIAGNOSIS — O24912 Unspecified diabetes mellitus in pregnancy, second trimester: Secondary | ICD-10-CM

## 2024-09-07 DIAGNOSIS — O09892 Supervision of other high risk pregnancies, second trimester: Secondary | ICD-10-CM

## 2024-09-07 DIAGNOSIS — Z3A15 15 weeks gestation of pregnancy: Secondary | ICD-10-CM

## 2024-09-07 DIAGNOSIS — E11319 Type 2 diabetes mellitus with unspecified diabetic retinopathy without macular edema: Secondary | ICD-10-CM

## 2024-09-07 NOTE — Progress Notes (Signed)
 TELEHEALTH OBSTETRICS VISIT ENCOUNTER NOTE  FOR DIABETES MANAGEMENT DURING PREGNANCY    Provider location: [facility] [place]   Patient location: Home  I connected with Denise [name] Her on 09/07/24 at  1:10 PM EDT by telephone at home and verified that I am speaking with the correct person using two identifiers. Of note, unable to do video encounter due to technical difficulties.    I discussed the limitations, risks, security and privacy concerns of performing an evaluation and management service by telephone and the availability of in person appointments. I also discussed with the patient that there may be a patient responsible charge related to this service. The patient expressed understanding and agreed to proceed.   History:   Denise [name] is a 33 y.o. [id number] at [redacted]w[redacted]d by LMP being seen today for diabetes management during pregnancy.   Patient reports no complaints. Reports fetal movement. Denies any contractions, bleeding or leaking of fluid.   The following portions of the patient's history were reviewed and updated as appropriate: allergies, current medications, past family history, past medical history, past social history, past surgical history and problem list.   Past Medical History:  Diagnosis Date   Allergy    Arthritis    Asthma    Carpal tunnel syndrome of left wrist    Gestational diabetes    History of cervical cerclage 07/28/2013   Rescue cerclage in place     Previous pregnancy with short cervix, but term delivery  This pregnancy, serial cervical lengths:  12/2 cervix long and closed  12/23 cervix 1.4 cm, vaginal progesterone  started  1/6 cervical length 0.2 cm, rescue cerclage was placed     History of pre-eclampsia in prior pregnancy, currently pregnant    Hx of chlamydia infection 2017   Pregnancy induced hypertension    Pre-E   Seasonal allergies    Trichomonas 2017   Past Surgical History:  Procedure Laterality Date    CERVICAL CERCLAGE N/A 11/23/2018   Procedure: CERCLAGE CERVICAL;  Surgeon: Fredirick Glenys RAMAN, MD;  Location: [facility] BIRTHING SUITES;  Service: Gynecology;  Laterality: N/A;   CERVICAL CERCLAGE N/A 08/30/2024   Procedure: CERCLAGE, CERVIX, VAGINAL APPROACH;  Surgeon: Alger Gong, MD;  [facility];  Service: Obstetrics;  Laterality: N/A;   DILATION AND CURETTAGE OF UTERUS  05/13/2022   Family History  Problem Relation Age of Onset   Arthritis Mother    Diabetes Mother    Diabetes Maternal Grandmother    Arthritis Maternal Grandmother    Social History   Tobacco Use   Smoking status: Never   Smokeless tobacco: Never  Vaping Use   Vaping status: Never Used  Substance Use Topics   Alcohol use: Not Currently   Drug use: No   Allergies  Allergen Reactions   Farxiga  [Dapagliflozin ] Nausea And Vomiting   Current Outpatient Medications on File Prior to Visit  Medication Sig Dispense Refill   aspirin  EC 81 MG tablet Take 2 tablets (162 mg total) by mouth daily. Swallow whole. (Patient not taking: Reported on 08/31/2024) 60 tablet 12   Blood Glucose Monitoring Suppl (ACCU-CHEK GUIDE) w/Device KIT Use as advised 1 kit 0   Continuous Glucose Sensor (DEXCOM G7 SENSOR) MISC 1 each by Does not apply route daily. 3 each 3   cyclobenzaprine  (FLEXERIL ) 10 MG tablet Take 1 tablet (10 mg total) by mouth 2 (two) times daily as needed. 20 tablet 2   escitalopram (LEXAPRO) 20 MG tablet Take  20 mg by mouth daily.     glucose blood (ACCU-CHEK GUIDE) test strip Use as instructed 3-4x a day 300 each 3   Insulin  Disposable Pump (OMNIPOD 5 DEXG7G6 PODS GEN 5) MISC Use 1 POD every 2 days 15 each 11   insulin  glargine (LANTUS  SOLOSTAR) 100 UNIT/ML Solostar Pen Inject 70 Units into the skin daily. 60 units prior to bedtime and 10 units q AM. Take 12 hours apart. 30 day supply (Patient not taking: Reported on 08/31/2024) 21 mL 3   insulin  lispro (HUMALOG  KWIKPEN) 100 UNIT/ML KwikPen Inject 10 Units into the  skin 3 (three) times daily. Inject 10 units with all meals, take 15-20 minutes before first bite. (Patient not taking: Reported on 08/31/2024)     insulin  lispro (HUMALOG ) 100 UNIT/ML injection Max dose 200 units per day. Used for omnipod insulin  pump, please give 30 day supply 10 mL 11   Insulin  Pen Needle 32G X 4 MM MISC Use 4x a day 300 each 3   metFORMIN  (GLUMETZA ) 1000 MG (MOD) 24 hr tablet Take 2,000 mg by mouth daily with breakfast. (Patient not taking: Reported on 08/31/2024)     metoCLOPramide  (REGLAN ) 10 MG tablet Take 1 tablet (10 mg total) by mouth 4 (four) times daily -  before meals and at bedtime. (Patient not taking: Reported on 08/31/2024) 60 tablet 1   prochlorperazine  (COMPAZINE ) 10 MG tablet Take 1 tablet (10 mg total) by mouth every 6 (six) hours as needed for nausea or vomiting. (Patient not taking: Reported on 08/31/2024) 120 tablet 0   progesterone  (PROMETRIUM ) 200 MG capsule 400 mg See admin instructions. Taking 200mg  orally at bedtime AND inserting a 200mg  capsule vaginally at bedtime.     promethazine  (PHENERGAN ) 25 MG tablet Take 1 tablet (25 mg total) by mouth every 6 (six) hours as needed. May insert vaginally, if unable to keep anything down by mouth (Patient not taking: Reported on 08/31/2024) 120 tablet 0   scopolamine  (TRANSDERM-SCOP) 1 MG/3DAYS Place 1 patch (1 mg total) onto the skin every 3 (three) days. (Patient not taking: Reported on 08/31/2024) 10 patch 0   No current facility-administered medications on file prior to visit.      Objective:   General:  Alert, oriented and cooperative.   Mental Status: Normal mood and affect perceived. Normal judgment and thought content.   The Rest of physical exam deferred due to type of encounter   Type 2 DM  Viability scan reassuring    F/u Anatomy scan scheduled 09/15/24  Blood sugar monitoring: Dexcom in place.    With CGM, target Blood sugar set at 65-140.   Currently glucose in target range is 28% in  target range, up from 20% from last.     Back up glucose monitor: Has at home.     Fasting BS 120's   2 hour PP 120-125     Current Diabetes medications:   Basal rates Omnipod in place in manual mode.    1.30 units per hour 3 am- 9 am> increase to 1.40 units per hour. 1.55 units per hour. 9am-9pm> Increase to 1.65 units per hour 1.40 units per hour 9 pm- 12 am> increase to 1.50 units per hour  1.55 units per hour 12 am- 3 am< No change.   Correction factor is set to 18 Carb ratio is 1:8 Target BS is 110   Plan:   -Continue omnipods below.   1.30 units per hour 3 am- 9 am> increase to 1.40 units  per hour. 1.55 units per hour. 9am-9pm> Increase to 1.65 units per hour 1.40 units per hour 9 pm- 12 am> increase to 1.50 units per hour  1.55 units per hour 12 am- 3 am< No change.   Correction factor is set to 18 Carb ratio is 1:8 Target BS is 110   -Recommend dosing 15- 20 minutes prior to eating.    -Avoid post meal coverage due hypoglycemia.    -Discussed only 30-40 grams of carbohydrates with each meal, with majority of your meals being protein and high fiber vegetables.    -Start a fiber supplement everyday.    -Protein snack before bed. Recommend 10-20 grams of protein before bed.    -Anticipate weekly diabetes visits until BS's are better controlled -Anticipate frequent in person OB visits along with MFM care.  -q4 week growth US  starting at 20w with antenatal weekly testing starting at 32 weeks or sooner if needed.    -Continue BASA 81 mg      This visit is for the purposes of diabetes management only. Please keep scheduled OB visits with OB team for prenatal care.    Preterm labor symptoms and general obstetric precautions including but not limited to vaginal bleeding, contractions, leaking of fluid and fetal movement were reviewed in detail with the patient.  I discussed the assessment and treatment plan with the patient. The patient was provided an  opportunity to ask questions and all were answered. The patient agreed with the plan and demonstrated an understanding of the instructions. The patient was advised to call back or seek an in-person office evaluation/go to MAU at [facility] for any urgent or concerning symptoms. Please refer to After Visit Summary for other counseling recommendations.    I provided 15 minutes of non-face-to-face time during this encounter.    Parrish Daddario, Delon FERNS, NP Faculty Practice Center for [employer], [facility]

## 2024-09-08 ENCOUNTER — Ambulatory Visit: Admitting: Certified Nurse Midwife

## 2024-09-08 VITALS — BP 131/74 | HR 83 | Wt 238.6 lb

## 2024-09-08 DIAGNOSIS — F419 Anxiety disorder, unspecified: Secondary | ICD-10-CM

## 2024-09-08 DIAGNOSIS — F32A Depression, unspecified: Secondary | ICD-10-CM | POA: Diagnosis not present

## 2024-09-08 DIAGNOSIS — Z8751 Personal history of pre-term labor: Secondary | ICD-10-CM | POA: Diagnosis not present

## 2024-09-08 DIAGNOSIS — O0992 Supervision of high risk pregnancy, unspecified, second trimester: Secondary | ICD-10-CM

## 2024-09-08 DIAGNOSIS — Z3A15 15 weeks gestation of pregnancy: Secondary | ICD-10-CM | POA: Diagnosis not present

## 2024-09-08 LAB — PANORAMA PRENATAL TEST FULL PANEL:PANORAMA TEST PLUS 5 ADDITIONAL MICRODELETIONS: FETAL FRACTION: 2.7
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
## 2024-09-14 ENCOUNTER — Telehealth: Admitting: Obstetrics and Gynecology

## 2024-09-14 DIAGNOSIS — O24912 Unspecified diabetes mellitus in pregnancy, second trimester: Secondary | ICD-10-CM | POA: Diagnosis not present

## 2024-09-14 DIAGNOSIS — Z3A16 16 weeks gestation of pregnancy: Secondary | ICD-10-CM

## 2024-09-14 DIAGNOSIS — E11319 Type 2 diabetes mellitus with unspecified diabetic retinopathy without macular edema: Secondary | ICD-10-CM

## 2024-09-14 NOTE — Progress Notes (Signed)
 TELEHEALTH OBSTETRICS VISIT ENCOUNTER NOTE  FOR DIABETES MANAGEMENT DURING PREGNANCY    Provider location: [facility] for [facility]   Patient location: Home  I connected with Denise [name] Her on 09/14/24 at  1:30 PM EDT by telephone at home and verified that I am speaking with the correct person using two identifiers. Of note, unable to do video encounter due to technical difficulties.    I discussed the limitations, risks, security and privacy concerns of performing an evaluation and management service by telephone and the availability of in person appointments. I also discussed with the patient that there may be a patient responsible charge related to this service. The patient expressed understanding and agreed to proceed.   History:   Denise [name] is a 33 y.o. [id number] at [redacted]w[redacted]d by LMP being seen today for diabetes management during pregnancy.  Patient reports no complaints. Reports fetal movement. Denies any contractions, bleeding or leaking of fluid.   The following portions of the patient's history were reviewed and updated as appropriate: allergies, current medications, past family history, past medical history, past social history, past surgical history and problem list.           Past Medical History:  Diagnosis Date   Allergy    Arthritis    Asthma    Carpal tunnel syndrome of left wrist    Gestational diabetes    History of cervical cerclage 07/28/2013   Rescue cerclage in place     Previous pregnancy with short cervix, but term delivery  This pregnancy, serial cervical lengths:  12/2 cervix long and closed  12/23 cervix 1.4 cm, vaginal progesterone  started  1/6 cervical length 0.2 cm, rescue cerclage was placed     History of pre-eclampsia in prior pregnancy, currently pregnant    Hx of chlamydia infection 2017   Pregnancy induced hypertension    Pre-E   Seasonal allergies    Trichomonas 2017   Past Surgical History:  Procedure  Laterality Date   CERVICAL CERCLAGE N/A 11/23/2018   Procedure: CERCLAGE CERVICAL;  Surgeon: Fredirick Glenys RAMAN, MD;  Location: [facility]IR[facility]ING SU[facility];  Service: Gynecology;  Laterality: N/A;   CERVICAL CERCLAGE N/A 08/30/2024   Procedure: CERCLAGE, CERVIX, VAGINAL APPROACH;  Surgeon: Alger Gong, MD;  Location: [facility] LD ORS;  Service: Obstetrics;  Laterality: N/A;   DILATION AND CURETTAGE OF UTERUS  05/13/2022   Family History  Problem Relation Age of Onset   Arthritis Mother    Diabetes Mother    Diabetes Maternal Grandmother    Arthritis Maternal Grandmother    Social History   Tobacco Use   Smoking status: Never   Smokeless tobacco: Never  Vaping Use   Vaping status: Never Used  Substance Use Topics   Alcohol use: Not Currently   Drug use: No   Allergies  Allergen Reactions   Farxiga  [Dapagliflozin ] Nausea And Vomiting   Current Outpatient Medications on File Prior to Visit  Medication Sig Dispense Refill   aspirin  EC 81 MG tablet Take 2 tablets (162 mg total) by mouth daily. Swallow whole. (Patient not taking: Reported on 08/31/2024) 60 tablet 12   Blood Glucose Monitoring Suppl (ACCU-CHEK GUIDE) w/Device KIT Use as advised 1 kit 0   Continuous Glucose Sensor (DEXCOM G7 SENSOR) MISC 1 each by Does not apply route daily. 3 each 3   cyclobenzaprine  (FLEXERIL ) 10 MG tablet Take 1 tablet (10 mg total) by mouth 2 (two) times daily as needed. 20 tablet 2  escitalopram (LEXAPRO) 20 MG tablet Take 20 mg by mouth daily.     glucose blood (ACCU-CHEK GUIDE) test strip Use as instructed 3-4x a day 300 each 3   Insulin  Disposable Pump (OMNIPOD 5 [id number] PODS GEN 5) MISC Use 1 POD every 2 days 15 each 11   insulin  glargine (LANTUS  SOLOSTAR) 100 UNIT/ML Solostar Pen Inject 70 Units into the skin daily. 60 units prior to bedtime and 10 units q AM. Take 12 hours apart. 30 day supply (Patient not taking: Reported on 08/31/2024) 21 mL 3   insulin  lispro (HUMALOG  KWIKPEN) 100 UNIT/ML KwikPen Inject  10 Units into the skin 3 (three) times daily. Inject 10 units with all meals, take 15-20 minutes before first bite. (Patient not taking: Reported on 08/31/2024)     insulin  lispro (HUMALOG ) 100 UNIT/ML injection Max dose 200 units per day. Used for omnipod insulin  pump, please give 30 day supply 10 mL 11   Insulin  Pen Needle 32G X 4 MM MISC Use 4x a day 300 each 3   metFORMIN  (GLUMETZA ) 1000 MG (MOD) 24 hr tablet Take 2,000 mg by mouth daily with breakfast. (Patient not taking: Reported on 08/31/2024)     metoCLOPramide  (REGLAN ) 10 MG tablet Take 1 tablet (10 mg total) by mouth 4 (four) times daily -  before meals and at bedtime. (Patient not taking: Reported on 08/31/2024) 60 tablet 1   prochlorperazine  (COMPAZINE ) 10 MG tablet Take 1 tablet (10 mg total) by mouth every 6 (six) hours as needed for nausea or vomiting. (Patient not taking: Reported on 08/31/2024) 120 tablet 0   progesterone  (PROMETRIUM ) 200 MG capsule 400 mg See admin instructions. Taking 200mg  orally at bedtime AND inserting a 200mg  capsule vaginally at bedtime.     promethazine  (PHENERGAN ) 25 MG tablet Take 1 tablet (25 mg total) by mouth every 6 (six) hours as needed. May insert vaginally, if unable to keep anything down by mouth (Patient not taking: Reported on 08/31/2024) 120 tablet 0   scopolamine  (TRANSDERM-SCOP) 1 MG/3DAYS Place 1 patch (1 mg total) onto the skin every 3 (three) days. (Patient not taking: Reported on 08/31/2024) 10 patch 0   No current facility-administered medications on file prior to visit.      Objective:   General:  Alert, oriented and cooperative.   Mental Status: Normal mood and affect perceived. Normal judgment and thought content.   The Rest of physical exam deferred due to type of encounter    Hospital admissions related to diabetes?   Type 2 DM/ Type 1/ GDM/ Early GDM.  Diagnosed    Viability scan    Anatomy scan    Eye exam:    A1c at the time of diagnoses.    Most recent  HgA1c:  Fetal Echo   PCP or endocrinologist.     Blood sugar monitoring    Dexcom G7 in place.   With CGM, target Blood sugar set at 65-140.   Currently glucose in target range is 35% in target range up from 28%, making progress.     Back up glucose monitor     Fasting BS 90-100   2 hour PP Majority less than 120, some overall meal peaks specifically in the evenings.      Current Diabetes medications:   -Continue omnipods below.    1.40 units per hour 3 am- 9 am> No changes  1.65 units per hour. 9am-9pm> No changes 1.50 units per hour 9 pm- 12 am> increase to 1.60 units per  hour  1.55 units per hour 12 am- 3 am< increase to 1.60 units per hour.    Correction factor is set to 18 Carb ratio is 1:8 Target BS is 110  Plan:  Will restart Metformin  1000 mg qAM  Continue pump changes above.    -Recommend dosing 15- 20 minutes prior to eating.    -Avoid post meal coverage due hypoglycemia.    -Discussed only 30-40 grams of carbohydrates with each meal, with majority of your meals being protein and high fiber vegetables.    -Continue Protein snack before bed. Recommend 10-20 grams of protein before bed.    -Anticipate weekly diabetes visits until BS's are better controlled -Anticipate frequent in person OB visits along with MFM care.  -q4 week growth US  starting at 20w with antenatal weekly testing starting at 32 weeks or sooner if needed.    -Continue BASA 81 mg      This visit is for the purposes of diabetes management only. Please keep scheduled OB visits with OB team for prenatal care.    Preterm labor symptoms and general obstetric precautions including but not limited to vaginal bleeding, contractions, leaking of fluid and fetal movement were reviewed in detail with the patient.  I discussed the assessment and treatment plan with the patient. The patient was provided an opportunity to ask questions and all were answered. The patient agreed with the plan and  demonstrated an understanding of the instructions. The patient was advised to call back or seek an in-person office evaluation/go to [facility] at [facility] for any urgent or concerning symptoms. Please refer to After Visit Summary for other counseling recommendations.    I provided 15 minutes of non-face-to-face time during this encounter.  Jaqulyn Chancellor, Delon FERNS, NP [facility] [employer], [facility]

## 2024-09-15 ENCOUNTER — Encounter (HOSPITAL_COMMUNITY): Payer: Self-pay | Admitting: Obstetrics and Gynecology

## 2024-09-15 ENCOUNTER — Ambulatory Visit

## 2024-09-15 ENCOUNTER — Other Ambulatory Visit: Payer: Self-pay | Admitting: *Deleted

## 2024-09-15 ENCOUNTER — Other Ambulatory Visit: Payer: Self-pay

## 2024-09-15 ENCOUNTER — Inpatient Hospital Stay (HOSPITAL_COMMUNITY)
Admission: AD | Admit: 2024-09-15 | Discharge: 2024-09-15 | Disposition: A | Discharge status: Home / Self Care | Attending: Obstetrics and Gynecology | Admitting: Obstetrics and Gynecology

## 2024-09-15 ENCOUNTER — Ambulatory Visit: Attending: Obstetrics and Gynecology | Admitting: Maternal & Fetal Medicine

## 2024-09-15 ENCOUNTER — Other Ambulatory Visit: Payer: Self-pay | Admitting: Certified Nurse Midwife

## 2024-09-15 VITALS — BP 125/76 | HR 79

## 2024-09-15 DIAGNOSIS — Z79890 Hormone replacement therapy (postmenopausal): Secondary | ICD-10-CM | POA: Diagnosis not present

## 2024-09-15 DIAGNOSIS — Z794 Long term (current) use of insulin: Secondary | ICD-10-CM

## 2024-09-15 DIAGNOSIS — Z87898 Personal history of other specified conditions: Secondary | ICD-10-CM

## 2024-09-15 DIAGNOSIS — F419 Anxiety disorder, unspecified: Secondary | ICD-10-CM | POA: Insufficient documentation

## 2024-09-15 DIAGNOSIS — O3432 Maternal care for cervical incompetence, second trimester: Secondary | ICD-10-CM | POA: Insufficient documentation

## 2024-09-15 DIAGNOSIS — O99212 Obesity complicating pregnancy, second trimester: Secondary | ICD-10-CM

## 2024-09-15 DIAGNOSIS — O3122X2 Continuing pregnancy after intrauterine death of one fetus or more, second trimester, fetus 2: Secondary | ICD-10-CM | POA: Insufficient documentation

## 2024-09-15 DIAGNOSIS — Z8751 Personal history of pre-term labor: Secondary | ICD-10-CM | POA: Diagnosis not present

## 2024-09-15 DIAGNOSIS — O99891 Other specified diseases and conditions complicating pregnancy: Secondary | ICD-10-CM | POA: Diagnosis not present

## 2024-09-15 DIAGNOSIS — O3110X0 Continuing pregnancy after spontaneous abortion of one fetus or more, unspecified trimester, not applicable or unspecified: Secondary | ICD-10-CM | POA: Diagnosis not present

## 2024-09-15 DIAGNOSIS — F32A Depression, unspecified: Secondary | ICD-10-CM | POA: Insufficient documentation

## 2024-09-15 DIAGNOSIS — Z9889 Other specified postprocedural states: Secondary | ICD-10-CM | POA: Diagnosis present

## 2024-09-15 DIAGNOSIS — O26879 Cervical shortening, unspecified trimester: Secondary | ICD-10-CM

## 2024-09-15 DIAGNOSIS — O09292 Supervision of pregnancy with other poor reproductive or obstetric history, second trimester: Secondary | ICD-10-CM | POA: Insufficient documentation

## 2024-09-15 DIAGNOSIS — O26892 Other specified pregnancy related conditions, second trimester: Secondary | ICD-10-CM | POA: Insufficient documentation

## 2024-09-15 DIAGNOSIS — O24112 Pre-existing diabetes mellitus, type 2, in pregnancy, second trimester: Secondary | ICD-10-CM | POA: Insufficient documentation

## 2024-09-15 DIAGNOSIS — Z3A16 16 weeks gestation of pregnancy: Secondary | ICD-10-CM

## 2024-09-15 DIAGNOSIS — Z8759 Personal history of other complications of pregnancy, childbirth and the puerperium: Secondary | ICD-10-CM

## 2024-09-15 DIAGNOSIS — Z3686 Encounter for antenatal screening for cervical length: Secondary | ICD-10-CM | POA: Insufficient documentation

## 2024-09-15 DIAGNOSIS — O219 Vomiting of pregnancy, unspecified: Secondary | ICD-10-CM

## 2024-09-15 DIAGNOSIS — Z9641 Presence of insulin pump (external) (internal): Secondary | ICD-10-CM | POA: Insufficient documentation

## 2024-09-15 DIAGNOSIS — R1085 Abdominal pain of multiple sites: Secondary | ICD-10-CM | POA: Diagnosis present

## 2024-09-15 DIAGNOSIS — Z148 Genetic carrier of other disease: Secondary | ICD-10-CM | POA: Diagnosis not present

## 2024-09-15 DIAGNOSIS — E11319 Type 2 diabetes mellitus with unspecified diabetic retinopathy without macular edema: Secondary | ICD-10-CM

## 2024-09-15 DIAGNOSIS — O99342 Other mental disorders complicating pregnancy, second trimester: Secondary | ICD-10-CM | POA: Insufficient documentation

## 2024-09-15 DIAGNOSIS — O3112X2 Continuing pregnancy after spontaneous abortion of one fetus or more, second trimester, fetus 2: Secondary | ICD-10-CM | POA: Diagnosis not present

## 2024-09-15 DIAGNOSIS — O0990 Supervision of high risk pregnancy, unspecified, unspecified trimester: Secondary | ICD-10-CM

## 2024-09-15 DIAGNOSIS — Z91419 Personal history of unspecified adult abuse: Secondary | ICD-10-CM | POA: Insufficient documentation

## 2024-09-15 DIAGNOSIS — M7918 Myalgia, other site: Secondary | ICD-10-CM | POA: Insufficient documentation

## 2024-09-15 DIAGNOSIS — Z7982 Long term (current) use of aspirin: Secondary | ICD-10-CM | POA: Diagnosis not present

## 2024-09-15 DIAGNOSIS — Z141 Cystic fibrosis carrier: Secondary | ICD-10-CM | POA: Diagnosis not present

## 2024-09-15 DIAGNOSIS — O09212 Supervision of pregnancy with history of pre-term labor, second trimester: Secondary | ICD-10-CM | POA: Insufficient documentation

## 2024-09-15 DIAGNOSIS — E119 Type 2 diabetes mellitus without complications: Secondary | ICD-10-CM

## 2024-09-15 DIAGNOSIS — O30042 Twin pregnancy, dichorionic/diamniotic, second trimester: Secondary | ICD-10-CM

## 2024-09-15 DIAGNOSIS — E669 Obesity, unspecified: Secondary | ICD-10-CM

## 2024-09-15 LAB — URINALYSIS, ROUTINE W REFLEX MICROSCOPIC
Bilirubin Urine: NEGATIVE
Glucose, UA: 50 mg/dL — AB
Hgb urine dipstick: NEGATIVE
Ketones, ur: NEGATIVE mg/dL
Leukocytes,Ua: NEGATIVE
Nitrite: NEGATIVE
Protein, ur: 30 mg/dL — AB
Specific Gravity, Urine: 1.021 (ref 1.005–1.030)
pH: 6 (ref 5.0–8.0)

## 2024-09-15 MED ORDER — SCOPOLAMINE 1 MG/3DAYS TD PT72: 1.0000 | MEDICATED_PATCH | TRANSDERMAL | 0 refills | Status: AC

## 2024-09-15 MED ORDER — CYCLOBENZAPRINE HCL 10 MG PO TABS: 10.0000 mg | ORAL_TABLET | Freq: Three times a day (TID) | ORAL | 2 refills | Status: AC | PRN

## 2024-09-15 MED ORDER — METOCLOPRAMIDE HCL 10 MG PO TABS
10.0000 mg | ORAL_TABLET | Freq: Once | ORAL | Status: AC
  Administered 2024-09-15: 10 mg via ORAL
  Filled 2024-09-15: qty 1

## 2024-09-15 MED ORDER — METOCLOPRAMIDE HCL 10 MG PO TABS: 10.0000 mg | ORAL_TABLET | Freq: Three times a day (TID) | ORAL | 1 refills | Status: AC

## 2024-09-15 MED ORDER — PROCHLORPERAZINE MALEATE 10 MG PO TABS: 10.0000 mg | ORAL_TABLET | Freq: Four times a day (QID) | ORAL | 0 refills | Status: AC | PRN

## 2024-09-15 NOTE — MAU Provider Note (Signed)
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 (six) hours as needed for nausea or vomiting.   progesterone  200 MG capsule Commonly known as: PROMETRIUM  400 mg See admin instructions. Taking 200mg  orally at bedtime AND inserting a 200mg  capsule vaginally at bedtime.   promethazine  25 MG tablet Commonly known as: PHENERGAN  Take 1 tablet (25 mg total) by mouth every 6 (six) hours as needed. May insert vaginally, if unable to keep anything down by mouth   scopolamine  1 MG/3DAYS Commonly known as: TRANSDERM-SCOP Place 1 patch (1 mg total) onto the skin every 3 (three) days.        Camie Rote, MSN, CNM 09/15/2024 5:31  PM  Certified Nurse Midwife, [facility]

## 2024-09-15 NOTE — MAU Note (Signed)
 Patient received 10mg  of reglan   PO at 1613. Patient vomited approx 1 min after receiving reglan .

## 2024-09-15 NOTE — MAU Note (Signed)
 Denise [name] is a 33 y.o. at [redacted]w[redacted]d here in MAU reporting: she's having left upper abdominal pain that began today after having an ultrasound to check cerclage.  Reports the pain is constant and feels like someone is punching her stomach.  Reports pain hurts to touch and hasn't eased since OB appt.  Also states hasn't been able to eat or drink due to pain and hasn't been able to keep anything down. Denies VB or LOF.    LMP:  Onset of complaint: today Pain score: 8 Vitals:   09/15/24 1452  BP: (!) 142/82  Pulse: 68  Resp: 18  Temp: 98.3 F (36.8 C)  SpO2: 100%     FHT: 158 bpm  Lab orders placed from triage: UA

## 2024-09-15 NOTE — Discharge Instructions (Signed)
 Denise [name],  It was a pleasure meeting you in MAU.   You may take Flexeril  as needed for pain relief, it may make you sleepy so please do not drive while taking until you know how it affects you. You may also take the Compazine  for nausea.  Please return with worsening symptoms or if the medications do not improve your symptoms.   Thank you for trusting us  to care for you, Camie, Midwife
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
## 2024-09-20 ENCOUNTER — Telehealth: Payer: Self-pay | Admitting: Family Medicine

## 2024-09-20 NOTE — Telephone Encounter (Signed)
 Patient called wanting to make an appointment with Midwife Shay. She said that she no longer wants to see Jamilla. I let the patient know that Claris is not in the office as often as Jam so I can switch her to see Claris for some visits but she will not be able to see her for all of her visits. The patient says no she already spoke to Jam and Shay and they know the situation and she does not care to explain it to me. Claris told her she comes to [facility] and the [facility] office. Patient says she wants to go wherever Claris is. I said okay and placed the patient on a brief hold while I looked for Shay's availability. After seeing she is not scheduled in our office I let the patient know to call the [facility] office because Claris is not scheduled here for the rest of the year. I offered the patient the office number and she declined and said she will just go up there.

## 2024-09-21 ENCOUNTER — Telehealth: Payer: Self-pay | Admitting: Dietician

## 2024-09-21 ENCOUNTER — Telehealth: Admitting: Obstetrics and Gynecology

## 2024-09-21 DIAGNOSIS — O24112 Pre-existing diabetes mellitus, type 2, in pregnancy, second trimester: Secondary | ICD-10-CM | POA: Diagnosis not present

## 2024-09-21 DIAGNOSIS — Z3A17 17 weeks gestation of pregnancy: Secondary | ICD-10-CM

## 2024-09-21 NOTE — Progress Notes (Signed)
 TELEHEALTH OBSTETRICS VISIT ENCOUNTER NOTE  FOR DIABETES MANAGEMENT DURING PREGNANCY    Provider location: [facility] [place]   Patient location: Home  I connected Denise [name] on 09/21/24 at  1:10 PM EST by telephone at home and verified that I am speaking with the correct person using two identifiers. Of note, unable to do video encounter due to technical difficulties.    I discussed the limitations, risks, security and privacy concerns of performing an evaluation and management service by telephone and the availability of in person appointments. I also discussed with the patient that there may be a patient responsible charge related to this service. The patient expressed understanding and agreed to proceed.   History:   Denise [name] is a 33 y.o. H5E8887 at [redacted]w[redacted]d by LMP being seen today for diabetes management during pregnancy.   Patient reports no complaints. Reports fetal movement. Denies any contractions, bleeding or leaking of fluid.   The following portions of the patient's history were reviewed and updated as appropriate: allergies, current medications, past family history, past medical history, past social history, past surgical history and problem list.    Past Medical History:  Diagnosis Date   Allergy    Arthritis    Asthma    Carpal tunnel syndrome of left wrist    Gestational diabetes    History of cervical cerclage 07/28/2013   Rescue cerclage in place     Previous pregnancy with short cervix, but term delivery  This pregnancy, serial cervical lengths:  12/2 cervix long and closed  12/23 cervix 1.4 cm, vaginal progesterone  started  1/6 cervical length 0.2 cm, rescue cerclage was placed     History of pre-eclampsia in prior pregnancy, currently pregnant    Hx of chlamydia infection 2017   Pregnancy induced hypertension    Pre-E   Seasonal allergies    Trichomonas 2017   Past Surgical History:  Procedure Laterality Date    CERVICAL CERCLAGE N/A 11/23/2018   Procedure: CERCLAGE CERVICAL;  Surgeon: Fredirick Glenys RAMAN, MD;  Location: [facility] BIRTHING SUITES;  Service: Gynecology;  Laterality: N/A;   CERVICAL CERCLAGE N/A 08/30/2024   Procedure: CERCLAGE, CERVIX, VAGINAL APPROACH;  Surgeon: Alger Gong, MD;  Location: [facility] LD ORS;  Service: Obstetrics;  Laterality: N/A;   DILATION AND CURETTAGE OF UTERUS  05/13/2022   Family History  Problem Relation Age of Onset   Arthritis Mother    Diabetes Mother    Diabetes Maternal Grandmother    Arthritis Maternal Grandmother    Social History   Tobacco Use   Smoking status: Never   Smokeless tobacco: Never  Vaping Use   Vaping status: Never Used  Substance Use Topics   Alcohol use: Not Currently   Drug use: No   Allergies  Allergen Reactions   Farxiga  [Dapagliflozin ] Nausea And Vomiting   Current Outpatient Medications on File Prior to Visit  Medication Sig Dispense Refill   aspirin  EC 81 MG tablet Take 2 tablets (162 mg total) by mouth daily. Swallow whole. (Patient not taking: Reported on 08/31/2024) 60 tablet 12   Blood Glucose Monitoring Suppl (ACCU-CHEK GUIDE) w/Device KIT Use as advised 1 kit 0   Continuous Glucose Sensor (DEXCOM G7 SENSOR) MISC 1 each by Does not apply route daily. 3 each 3   cyclobenzaprine  (FLEXERIL ) 10 MG tablet Take 1 tablet (10 mg total) by mouth 2 (two) times daily as needed. (Patient not taking: Reported on 09/15/2024) 20 tablet 2  cyclobenzaprine  (FLEXERIL ) 10 MG tablet Take 1 tablet (10 mg total) by mouth 3 (three) times daily as needed for muscle spasms. 30 tablet 2   escitalopram (LEXAPRO) 20 MG tablet Take 20 mg by mouth daily.     glucose blood (ACCU-CHEK GUIDE) test strip Use as instructed 3-4x a day 300 each 3   Insulin  Disposable Pump (OMNIPOD 5 DEXG7G6 PODS GEN 5) MISC Use 1 POD every 2 days 15 each 11   insulin  lispro (HUMALOG ) 100 UNIT/ML injection Max dose 200 units per day. Used for omnipod insulin  pump, please give 30  day supply 10 mL 11   Insulin  Pen Needle 32G X 4 MM MISC Use 4x a day 300 each 3   metoCLOPramide  (REGLAN ) 10 MG tablet Take 1 tablet (10 mg total) by mouth 4 (four) times daily -  before meals and at bedtime. 60 tablet 1   prochlorperazine  (COMPAZINE ) 10 MG tablet Take 1 tablet (10 mg total) by mouth every 6 (six) hours as needed for nausea or vomiting. 120 tablet 0   progesterone  (PROMETRIUM ) 200 MG capsule 400 mg See admin instructions. Taking 200mg  orally at bedtime AND inserting a 200mg  capsule vaginally at bedtime.     promethazine  (PHENERGAN ) 25 MG tablet Take 1 tablet (25 mg total) by mouth every 6 (six) hours as needed. May insert vaginally, if unable to keep anything down by mouth (Patient not taking: Reported on 08/31/2024) 120 tablet 0   scopolamine  (TRANSDERM-SCOP) 1 MG/3DAYS Place 1 patch (1 mg total) onto the skin every 3 (three) days. 10 patch 0   No current facility-administered medications on file prior to visit.      Objective:   General:  Alert, oriented and cooperative.   Mental Status: Normal mood and affect perceived. Normal judgment and thought content.   Type 2 DM  Anatomy scan: scheduled.   Fetal Echo: Primary OB to schedule    Blood sugar monitoring: CGM    With CGM, target Blood sugar set at 65-140.   Currently glucose in target range is 47% in target range> up from 35% last week. Making significant progress weekly.     Has back up glucose monitor     Fasting BS Majority <95 Overall baseline 90's   2 hour PP: meal peaks are elevated, taking more than 2 hours to come down to <120     Current Diabetes medications:  Omnipod insulin  pump in place.  On Manual mode   Plan:   -Continue follow up with diabetes educator.  -Continue omnipods below.    1.40 units per hour 3 am- 9 am> no changes  1.65 units per hour. 9am-9pm> No changes  1.60 units per hour 9 pm- 12 am> increase to 1.65 units per hour.  1.60 units per hour 12 am- 3 am> no changes.     Correction factor is set to 18 Carb ratio is 1:> change to 1:6 Target BS is 110  -Recommend dosing 15- 20 minutes prior to eating.    -Avoid post meal coverage due hypoglycemia.    -Discussed only 30-40 grams of carbohydrates with each meal, with majority of your meals being protein and high fiber vegetables.    -Start a fiber supplement everyday.    -Protein snack before bed. Recommend 10-20 grams of protein before bed.    -Anticipate weekly diabetes visits until BS's are better controlled -Anticipate frequent in person OB visits along with MFM care.  -q4 week growth US  starting at 20w with antenatal weekly  testing starting at 32 weeks or sooner if needed.    -Continue BASA 81 mg      This visit is for the purposes of diabetes management only. Please keep scheduled OB visits with OB team for prenatal care.    Preterm labor symptoms and general obstetric precautions including but not limited to vaginal bleeding, contractions, leaking of fluid and fetal movement were reviewed in detail with the patient.  I discussed the assessment and treatment plan with the patient. The patient was provided an opportunity to ask questions and all were answered. The patient agreed with the plan and demonstrated an understanding of the instructions. The patient was advised to call back or seek an in-person office evaluation/go to MAU at [facility] for any urgent or concerning symptoms. Please refer to After Visit Summary for other counseling recommendations.    I provided 15 minutes of non-face-to-face time during this encounter.   Kristilyn Coltrane, Delon FERNS, NP [facility] [employer], [facility]

## 2024-09-21 NOTE — Telephone Encounter (Signed)
 Dexcom Clarity Reviewed.  Patient continues to have significant highs and lows.  She is to be followed this afternoon by Margery Mosses, NP to work on insulin  therapy via pump. I have also made an appointment for her to see me Thursday to discuss more about her eating and affects on glucose.  Leita Constable, RD, LDN, CDCES, DipACLM

## 2024-09-23 ENCOUNTER — Encounter: Attending: Internal Medicine | Admitting: Dietician

## 2024-09-23 DIAGNOSIS — Z794 Long term (current) use of insulin: Secondary | ICD-10-CM | POA: Insufficient documentation

## 2024-09-23 DIAGNOSIS — E119 Type 2 diabetes mellitus without complications: Secondary | ICD-10-CM | POA: Diagnosis not present

## 2024-09-23 NOTE — Progress Notes (Signed)
 Diabetes Self-Management Education  Visit Type: First/Initial  Appt. Start Time: 0807 Appt. End Time: 0907  09/23/2024  [name], identified by name and date of birth, is a 33 y.o. female with a diagnosis of Diabetes: Type 2 (during pregnancy).   ASSESSMENT Patient is here today alone.  She was trained on the Omnipod 5 insulin  pump on 08/19/2024 and is followed by Delon Emms, NP.  She is running her pump in Manual Mode.  She would like to continue to use an insulin  pump after her baby is born and understands the importance of blood glucose control currently as well as in the future.  She is counting carbohydrates. Some lows and discussed treatment of hypoglycemia.  Nausea with the smell of food so has a difficulty cooking.  Her 52 yo daughter will help cook at times. She is drinking premier protein shakes at night before bed which has helped prevent overnight lows. Discussed her fiber intake. She is now able to go for a walk since her blood glucose is lower and she has gotten a circulage.  Dexcom G7 reviewed.  Glucose much improved.   History includes:  Type 2 Diabetes.   Medications include:  Humalog  via pump Labs noted to include:  A1C 14.1% 01/29/2023  Last menstrual period 05/25/2024, unknown if currently breastfeeding. There is no height or weight on file to calculate BMI.   Diabetes Self-Management Education - 09/23/24 2104       Visit Information   Visit Type First/Initial      Initial Visit   Diabetes Type Type 2   during pregnancy     Pre-Education Assessment   Patient understands the diabetes disease and treatment process. Needs Review    Patient understands incorporating nutritional management into lifestyle. Needs Review    Patient undertands incorporating physical activity into lifestyle. Needs Review    Patient understands using medications safely. Needs Review    Patient understands monitoring blood glucose, interpreting and using results Needs  Review    Patient understands prevention, detection, and treatment of acute complications. Needs Review    Patient understands prevention, detection, and treatment of chronic complications. Needs Review    Patient understands how to develop strategies to address psychosocial issues. Needs Review    Patient understands how to develop strategies to promote health/change behavior. Needs Review      Dietary Intake   Breakfast Bagel with honey almond cream cheese    Snack (morning) McDonald's oatmeal    Lunch Chicken nuggets and fries    Snack (afternoon) Kettle popcorn    Dinner Wendys Chicken Nuggets    Snack (evening) none    Beverage(s) water      Activity / Exercise   Activity / Exercise Type Light (walking / raking leaves)      Patient Education   Disease Pathophysiology Definition of diabetes, type 1 and 2, and the diagnosis of diabetes    Healthy Eating Meal options for control of blood glucose level and chronic complications.;Carbohydrate counting    Being Active Role of exercise on diabetes management, blood pressure control and cardiac health.;Helped patient identify appropriate exercises in relation to his/her diabetes, diabetes complications and other health issue.    Medications Reviewed patients medication for diabetes, action, purpose, timing of dose and side effects.   timing of boluses   Monitoring Taught/evaluated CGM (comment)    Acute complications Taught prevention, symptoms, and  treatment of hypoglycemia - the 15 rule.    Chronic complications Relationship between chronic  complications and blood glucose control    Diabetes Stress and Support Identified and addressed patients feelings and concerns about diabetes;Worked with patient to identify barriers to care and solutions      Individualized Goals (developed by patient)   Nutrition General guidelines for healthy choices and portions discussed;Carb counting    Physical Activity Exercise 5-7 days per week;30 minutes  per day    Medications take my medication as prescribed    Monitoring  Consistenly use CGM    Problem Solving Eating Pattern;Addressing barriers to behavior change      Post-Education Assessment   Patient understands the diabetes disease and treatment process. Comprehends key points    Patient understands incorporating nutritional management into lifestyle. Comprehends key points    Patient undertands incorporating physical activity into lifestyle. Comprehends key points    Patient understands using medications safely. Comphrehends key points    Patient understands monitoring blood glucose, interpreting and using results Comprehends key points    Patient understands prevention, detection, and treatment of acute complications. Comprehends key points    Patient understands prevention, detection, and treatment of chronic complications. Comprehends key points    Patient understands how to develop strategies to address psychosocial issues. Comprehends key points    Patient understands how to develop strategies to promote health/change behavior. Comprehends key points      Outcomes   Expected Outcomes Demonstrated interest in learning. Expect positive outcomes    Future DMSE PRN    Program Status Completed          Individualized Plan for Diabetes Self-Management Training:   Learning Objective:  Patient will have a greater understanding of diabetes self-management. Patient education plan is to attend individual and/or group sessions per assessed needs and concerns.   Plan:   Patient Instructions  [place] job on changes made! Walk as allowed by your OB staff Bolus before eating all carbs  Consider obtaining an Endocrinologist for after you deliver the baby to aim on staying on your insulin  pump.  Eat more Non-Starchy Vegetables These include greens, broccoli, cauliflower, cabbage, carrots, beets, eggplant, peppers, squash and others. Minimize added sugars and refined grains Rethink  what you drink.  Choose beverages without added sugar.  Look for 0 carbs on the label. See the list of whole grains below.  Find alternatives to usual sweet treats. Choose whole foods over processed. Make simple meals at home more often than eating out.  Tips to increase fiber in your diet: (All plants have fiber.  Eat a variety. There are more than are on this list.) Slowly increase the amount of fiber you eat to 25-35 grams per day.  (More is fine if you tolerate it.) Fiber from whole grains, nuts and seeds Quinoa, 1/2 cup = 5 grams Bulgur, 1/2 cup = 4.1 grams Popcorn, 3 cups = 3.6 grams Whole Wheat Spaghetti, 1/2 cup = 3.2 grams Barley, 1/2 cup = 3 grams Oatmeal, 1/2 cup = 2 grams Whole Wheat English Muffin = 3 grams Corn, 1/2 cup = 2.1 grams Brown Rice, 1/2 cup = 1.8 grams Flax seeds, 1 Tablespoon = 2.8 grams Chia seeds, 1 Tablespoon = 11 grams Almonds, 1 ounce = 3.5 grams fiber Fiber from legumes Kidney beans, 1/2 cup 7.9 grams Lentils, 1/2 cup = 7.8 grams Pinto beans, 1/2 cup = 7.7 grams Black beans, 1/2 cup = 7.6 grams Lima beans, 1/2 cup 6.4 grams Chick peas, 1/2 cup = 5.3 grams Black eyed peas, 1/2 cup = 4 grams Fiber from  fruits and vegetables Pear, 6 grams Apple. 3.3 grams Raspberries or Blackberries, 3/4 cup = 6 grams Strawberries or Blueberries, 1 cup = 3.4 grams Baked sweet potato 3.8 grams fiber Baked potato with skin 4.4 grams  Peas, 1/2 cup = 4.4 grams  Spinach, 1/2 cup cooked = 3.5 grams  Avocado, 1/2 = 5 grams      Expected Outcomes:  Demonstrated interest in learning. Expect positive outcomes  Education material provided:   If problems or questions, patient to contact team via:  Phone  Future DSME appointment: PRN

## 2024-09-23 NOTE — Patient Instructions (Addendum)
 Great job on changes made! Walk as allowed by your OB staff Bolus before eating all carbs  Consider obtaining an Endocrinologist for after you deliver the baby to aim on staying on your insulin  pump.  Eat more Non-Starchy Vegetables These include greens, broccoli, cauliflower, cabbage, carrots, beets, eggplant, peppers, squash and others. Minimize added sugars and refined grains Rethink what you drink.  Choose beverages without added sugar.  Look for 0 carbs on the label. See the list of whole grains below.  Find alternatives to usual sweet treats. Choose whole foods over processed. Make simple meals at home more often than eating out.  Tips to increase fiber in your diet: (All plants have fiber.  Eat a variety. There are more than are on this list.) Slowly increase the amount of fiber you eat to 25-35 grams per day.  (More is fine if you tolerate it.) Fiber from whole grains, nuts and seeds Quinoa, 1/2 cup = 5 grams Bulgur, 1/2 cup = 4.1 grams Popcorn, 3 cups = 3.6 grams Whole Wheat Spaghetti, 1/2 cup = 3.2 grams Barley, 1/2 cup = 3 grams Oatmeal, 1/2 cup = 2 grams Whole Wheat English Muffin = 3 grams Corn, 1/2 cup = 2.1 grams Brown Rice, 1/2 cup = 1.8 grams Flax seeds, 1 Tablespoon = 2.8 grams Chia seeds, 1 Tablespoon = 11 grams Almonds, 1 ounce = 3.5 grams fiber Fiber from legumes Kidney beans, 1/2 cup 7.9 grams Lentils, 1/2 cup = 7.8 grams Pinto beans, 1/2 cup = 7.7 grams Black beans, 1/2 cup = 7.6 grams Lima beans, 1/2 cup 6.4 grams Chick peas, 1/2 cup = 5.3 grams Black eyed peas, 1/2 cup = 4 grams Fiber from fruits and vegetables Pear, 6 grams Apple. 3.3 grams Raspberries or Blackberries, 3/4 cup = 6 grams Strawberries or Blueberries, 1 cup = 3.4 grams Baked sweet potato 3.8 grams fiber Baked potato with skin 4.4 grams  Peas, 1/2 cup = 4.4 grams  Spinach, 1/2 cup cooked = 3.5 grams  Avocado, 1/2 = 5 grams

## 2024-09-28 ENCOUNTER — Telehealth: Admitting: Obstetrics and Gynecology

## 2024-09-28 DIAGNOSIS — Z3A18 18 weeks gestation of pregnancy: Secondary | ICD-10-CM | POA: Diagnosis not present

## 2024-09-28 DIAGNOSIS — O24112 Pre-existing diabetes mellitus, type 2, in pregnancy, second trimester: Secondary | ICD-10-CM

## 2024-09-28 NOTE — Progress Notes (Signed)
 TELEHEALTH OBSTETRICS VISIT ENCOUNTER NOTE  FOR DIABETES MANAGEMENT DURING PREGNANCY    Provider location: [facility]   Patient location: Home  I connected with AL GAGEN @ on 09/28/24 at  1:10 PM EST by telephone at home and verified that I am speaking with the correct person using two identifiers. Of note, unable to do video encounter due to technical difficulties.    I discussed the limitations, risks, security and privacy concerns of performing an evaluation and management service by telephone and the availability of in person appointments. I also discussed with the patient that there may be a patient responsible charge related to this service. The patient expressed understanding and agreed to proceed.   History:   Denise [name] is a 33 y.o. [id number] at [redacted]w[redacted]d by LMP being seen today for diabetes management during pregnancy.   Patient reports no complaints. Reports fetal movement. Denies any contractions, bleeding or leaking of fluid.   The following portions of the patient's history were reviewed and updated as appropriate: allergies, current medications, past family history, past medical history, past social history, past surgical history and problem list.    Past Medical History:  Diagnosis Date   Allergy    Arthritis    Asthma    Carpal tunnel syndrome of left wrist    Gestational diabetes    History of cervical cerclage 07/28/2013   Rescue cerclage in place     Previous pregnancy with short cervix, but term delivery  This pregnancy, serial cervical lengths:  12/2 cervix long and closed  12/23 cervix 1.4 cm, vaginal progesterone  started  1/6 cervical length 0.2 cm, rescue cerclage was placed     History of pre-eclampsia in prior pregnancy, currently pregnant    Hx of chlamydia infection 2017   Pregnancy induced hypertension    Pre-E   Seasonal allergies    Trichomonas 2017   Past Surgical History:  Procedure Laterality Date    CERVICAL CERCLAGE N/A 11/23/2018   Procedure: CERCLAGE CERVICAL;  Surgeon: Fredirick Glenys RAMAN, MD;  Location: [facility];  Service: Gynecology;  Laterality: N/A;   CERVICAL CERCLAGE N/A 08/30/2024   Procedure: CERCLAGE, CERVIX, VAGINAL APPROACH;  Surgeon: Alger Gong, MD;  Location: [facility] LD ORS;  Service: Obstetrics;  Laterality: N/A;   DILATION AND CURETTAGE OF UTERUS  05/13/2022   Family History  Problem Relation Age of Onset   Arthritis Mother    Diabetes Mother    Diabetes Maternal Grandmother    Arthritis Maternal Grandmother    Social History   Tobacco Use   Smoking status: Never   Smokeless tobacco: Never  Vaping Use   Vaping status: Never Used  Substance Use Topics   Alcohol use: Not Currently   Drug use: No   Allergies  Allergen Reactions   Farxiga  [Dapagliflozin ] Nausea And Vomiting   Current Outpatient Medications on File Prior to Visit  Medication Sig Dispense Refill   aspirin  EC 81 MG tablet Take 2 tablets (162 mg total) by mouth daily. Swallow whole. (Patient not taking: Reported on 08/31/2024) 60 tablet 12   Blood Glucose Monitoring Suppl (ACCU-CHEK GUIDE) w/Device KIT Use as advised 1 kit 0   Continuous Glucose Sensor (DEXCOM G7 SENSOR) MISC 1 each by Does not apply route daily. 3 each 3   cyclobenzaprine  (FLEXERIL ) 10 MG tablet Take 1 tablet (10 mg total) by mouth 2 (two) times daily as needed. (Patient not taking: Reported on 09/15/2024) 20 tablet 2  cyclobenzaprine  (FLEXERIL ) 10 MG tablet Take 1 tablet (10 mg total) by mouth 3 (three) times daily as needed for muscle spasms. 30 tablet 2   escitalopram (LEXAPRO) 20 MG tablet Take 20 mg by mouth daily.     glucose blood (ACCU-CHEK GUIDE) test strip Use as instructed 3-4x a day 300 each 3   Insulin  Disposable Pump (OMNIPOD 5 [id number] PODS GEN 5) MISC Use 1 POD every 2 days 15 each 11   insulin  lispro (HUMALOG ) 100 UNIT/ML injection Max dose 200 units per day. Used for omnipod insulin  pump, please give 30  day supply 10 mL 11   Insulin  Pen Needle 32G X 4 MM MISC Use 4x a day 300 each 3   metoCLOPramide  (REGLAN ) 10 MG tablet Take 1 tablet (10 mg total) by mouth 4 (four) times daily -  before meals and at bedtime. 60 tablet 1   prochlorperazine  (COMPAZINE ) 10 MG tablet Take 1 tablet (10 mg total) by mouth every 6 (six) hours as needed for nausea or vomiting. 120 tablet 0   progesterone  (PROMETRIUM ) 200 MG capsule 400 mg See admin instructions. Taking 200mg  orally at bedtime AND inserting a 200mg  capsule vaginally at bedtime.     promethazine  (PHENERGAN ) 25 MG tablet Take 1 tablet (25 mg total) by mouth every 6 (six) hours as needed. May insert vaginally, if unable to keep anything down by mouth (Patient not taking: Reported on 08/31/2024) 120 tablet 0   scopolamine  (TRANSDERM-SCOP) 1 MG/3DAYS Place 1 patch (1 mg total) onto the skin every 3 (three) days. 10 patch 0   No current facility-administered medications on file prior to visit.    Objective:   General:  Alert, oriented and cooperative.   Mental Status: Normal mood and affect perceived. Normal judgment and thought content.   The Rest of physical exam deferred due to type of encounter  Type 2 DM   Viability scan Reassuring.    Anatomy scan 10/08/2024   Most recent HgA1c: ordered   Fetal Echo scheduled 10/05/2024   PCP or endocrinologist.     Blood sugar monitoring: CGM in place.     With CGM, target Blood sugar set at 65-140.   Currently glucose in target range is 59% in target range    Has Back up glucose monitor in place.     Fasting BS 80-100.   2 hour PP improving. Overall baseline 110's     Current Diabetes medications:   1.40 units per hour 3 am- 9 am> no changes  1.65 units per hour. 9am-9pm> No changes  1.65 units per hour 9 pm- 12 am> increase to 1.70 units per hour.  1.60 units per hour 12 am- 3 am> no changes.    Correction factor is set to 18 Carb ratio is 1:6> change to 1:5 Target BS is  110  Taking Metformin  500 mg qAM (started this 09/28/2024)   Plan:   1.40 units per hour 3 am- 9 am> no changes  1.65 units per hour. 9am-9pm> No changes  1.65 units per hour 9 pm- 12 am> increase to 1.70 units per hour.  1.60 units per hour 12 am- 3 am> no changes.    Correction factor is set to 18 Carb ratio is 1:6> change to 1:5 Target BS is 110  Continue metformin  500 mg qAM  -Recommend dosing 15- 20 minutes prior to eating.    -Avoid post meal coverage due hypoglycemia.    -Discussed only 30-40 grams of carbohydrates with each meal,  with majority of your meals being protein and high fiber vegetables.    -Start a fiber supplement everyday.    -Protein snack before bed. Recommend 10-20 grams of protein before bed.    -Anticipate weekly diabetes visits until BS's are better controlled -Anticipate frequent in person OB visits along with MFM care.  -q4 week growth US  starting at 20w with antenatal weekly testing starting at 32 weeks or sooner if needed.    -Continue BASA 81 mg      This visit is for the purposes of diabetes management only. Please keep scheduled OB visits with OB team for prenatal care.    Preterm labor symptoms and general obstetric precautions including but not limited to vaginal bleeding, contractions, leaking of fluid and fetal movement were reviewed in detail with the patient.  I discussed the assessment and treatment plan with the patient. The patient was provided an opportunity to ask questions and all were answered. The patient agreed with the plan and demonstrated an understanding of the instructions. The patient was advised to call back or seek an in-person office evaluation/go to [facility] at [facility] for any urgent or concerning symptoms. Please refer to After Visit Summary for other counseling recommendations.    I provided 15 minutes of non-face-to-face time during this encounter.    Kasper Mudrick, Delon FERNS, NP Faculty  [facility] for [employer], [facility]

## 2024-09-29 ENCOUNTER — Other Ambulatory Visit: Payer: Self-pay

## 2024-09-29 ENCOUNTER — Ambulatory Visit: Admitting: Certified Nurse Midwife

## 2024-09-29 ENCOUNTER — Encounter: Admitting: Certified Nurse Midwife

## 2024-09-29 ENCOUNTER — Ambulatory Visit

## 2024-09-29 VITALS — BP 136/84 | HR 83 | Wt 246.0 lb

## 2024-09-29 DIAGNOSIS — Z794 Long term (current) use of insulin: Secondary | ICD-10-CM

## 2024-09-29 DIAGNOSIS — E119 Type 2 diabetes mellitus without complications: Secondary | ICD-10-CM | POA: Diagnosis not present

## 2024-09-29 DIAGNOSIS — Z3A18 18 weeks gestation of pregnancy: Secondary | ICD-10-CM | POA: Diagnosis not present

## 2024-09-29 DIAGNOSIS — F32A Depression, unspecified: Secondary | ICD-10-CM | POA: Diagnosis not present

## 2024-09-29 DIAGNOSIS — O0992 Supervision of high risk pregnancy, unspecified, second trimester: Secondary | ICD-10-CM | POA: Diagnosis not present

## 2024-09-29 DIAGNOSIS — F419 Anxiety disorder, unspecified: Secondary | ICD-10-CM | POA: Diagnosis not present

## 2024-09-29 DIAGNOSIS — Z8751 Personal history of pre-term labor: Secondary | ICD-10-CM
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
## 2024-10-05 ENCOUNTER — Telehealth: Payer: Self-pay

## 2024-10-05 ENCOUNTER — Telehealth (INDEPENDENT_AMBULATORY_CARE_PROVIDER_SITE_OTHER): Admitting: Obstetrics and Gynecology

## 2024-10-05 DIAGNOSIS — Z3A19 19 weeks gestation of pregnancy: Secondary | ICD-10-CM | POA: Diagnosis not present

## 2024-10-05 DIAGNOSIS — O24112 Pre-existing diabetes mellitus, type 2, in pregnancy, second trimester: Secondary | ICD-10-CM

## 2024-10-05 DIAGNOSIS — O24410 Gestational diabetes mellitus in pregnancy, diet controlled: Secondary | ICD-10-CM | POA: Diagnosis not present

## 2024-10-05 DIAGNOSIS — Q2545 Double aortic arch: Secondary | ICD-10-CM | POA: Diagnosis not present

## 2024-10-05 MED ORDER — ALBUTEROL SULFATE HFA 108 (90 BASE) MCG/ACT IN AERS: 1.0000 | INHALATION_SPRAY | Freq: Four times a day (QID) | RESPIRATORY_TRACT | 1 refills | Status: AC | PRN

## 2024-10-05 MED ORDER — BUDESONIDE 90 MCG/ACT IN AEPB: 1.0000 | INHALATION_SPRAY | Freq: Two times a day (BID) | RESPIRATORY_TRACT | 2 refills | Status: DC

## 2024-10-05 NOTE — Telephone Encounter (Signed)
 RN spoke with patient to see if ready to sign on for mychart video visit. Pt reported plans to sign on.  Silvano LELON Piano, RN

## 2024-10-05 NOTE — Progress Notes (Signed)
 TELEHEALTH OBSTETRICS VISIT ENCOUNTER NOTE  FOR DIABETES MANAGEMENT DURING PREGNANCY    Provider location: Center for [facility] at [place]   Patient location: Home  I connected with Denise [name] @ on 10/05/24 at  1:10 PM EST by telephone at home and verified that I am speaking with the correct person using two identifiers. Of note, unable to do video encounter due to technical difficulties.    I discussed the limitations, risks, security and privacy concerns of performing an evaluation and management service by telephone and the availability of in person appointments. I also discussed with the patient that there may be a patient responsible charge related to this service. The patient expressed understanding and agreed to proceed.   History:   Denise [name] is a 33 y.o. [id number] at [redacted]w[redacted]d by LMP being seen today for diabetes management during pregnancy.   Patient reports no complaints. Reports fetal movement. Denies any contractions, bleeding or leaking of fluid.   The following portions of the patient's history were reviewed and updated as appropriate: allergies, current medications, past family history, past medical history, past social history, past surgical history and problem list.    Past Medical History:  Diagnosis Date   Allergy    Arthritis    Asthma    Carpal tunnel syndrome of left wrist    Gestational diabetes    History of cervical cerclage 07/28/2013   Rescue cerclage in place     Previous pregnancy with short cervix, but term delivery  This pregnancy, serial cervical lengths:  12/2 cervix long and closed  12/23 cervix 1.4 cm, vaginal progesterone  started  1/6 cervical length 0.2 cm, rescue cerclage was placed     History of pre-eclampsia in prior pregnancy, currently pregnant    Hx of chlamydia infection 2017   Pregnancy induced hypertension    Pre-E   Seasonal allergies    Trichomonas 2017   Past Surgical History:  Procedure Laterality Date    CERVICAL CERCLAGE N/A 11/23/2018   Procedure: CERCLAGE CERVICAL;  Surgeon: Fredirick Glenys RAMAN, MD;  Location: [facility] BIRTHING SUITES;  Service: Gynecology;  Laterality: N/A;   CERVICAL CERCLAGE N/A 08/30/2024   Procedure: CERCLAGE, CERVIX, VAGINAL APPROACH;  Surgeon: Alger Gong, MD;  Location: [facility]S;  Service: Obstetrics;  Laterality: N/A;   DILATION AND CURETTAGE OF UTERUS  05/13/2022   Family History  Problem Relation Age of Onset   Arthritis Mother    Diabetes Mother    Diabetes Maternal Grandmother    Arthritis Maternal Grandmother    Social History   Tobacco Use   Smoking status: Never   Smokeless tobacco: Never  Vaping Use   Vaping status: Never Used  Substance Use Topics   Alcohol use: Not Currently   Drug use: No   Allergies  Allergen Reactions   Farxiga  [Dapagliflozin ] Nausea And Vomiting   Current Outpatient Medications on File Prior to Visit  Medication Sig Dispense Refill   aspirin  EC 81 MG tablet Take 2 tablets (162 mg total) by mouth daily. Swallow whole. 60 tablet 12   Blood Glucose Monitoring Suppl (ACCU-CHEK GUIDE) w/Device KIT Use as advised 1 kit 0   Continuous Glucose Sensor (DEXCOM G7 SENSOR) MISC 1 each by Does not apply route daily. 3 each 3   cyclobenzaprine  (FLEXERIL ) 10 MG tablet Take 1 tablet (10 mg total) by mouth 2 (two) times daily as needed. (Patient not taking: Reported on 09/29/2024) 20 tablet 2   cyclobenzaprine  (FLEXERIL ) 10 MG  tablet Take 1 tablet (10 mg total) by mouth 3 (three) times daily as needed for muscle spasms. 30 tablet 2   escitalopram (LEXAPRO) 20 MG tablet Take 20 mg by mouth daily. (Patient not taking: Reported on 09/29/2024)     glucose blood (ACCU-CHEK GUIDE) test strip Use as instructed 3-4x a day 300 each 3   Insulin  Disposable Pump (OMNIPOD 5 DEXG7G6 PODS GEN 5) MISC Use 1 POD every 2 days 15 each 11   insulin  lispro (HUMALOG ) 100 UNIT/ML injection Max dose 200 units per day. Used for omnipod insulin  pump, please give 30  day supply 10 mL 11   Insulin  Pen Needle 32G X 4 MM MISC Use 4x a day 300 each 3   metoCLOPramide  (REGLAN ) 10 MG tablet Take 1 tablet (10 mg total) by mouth 4 (four) times daily -  before meals and at bedtime. 60 tablet 1   prochlorperazine  (COMPAZINE ) 10 MG tablet Take 1 tablet (10 mg total) by mouth every 6 (six) hours as needed for nausea or vomiting. 120 tablet 0   progesterone  (PROMETRIUM ) 200 MG capsule 400 mg See admin instructions. Taking 200mg  orally at bedtime AND inserting a 200mg  capsule vaginally at bedtime.     promethazine  (PHENERGAN ) 25 MG tablet Take 1 tablet (25 mg total) by mouth every 6 (six) hours as needed. May insert vaginally, if unable to keep anything down by mouth (Patient not taking: Reported on 09/29/2024) 120 tablet 0   scopolamine  (TRANSDERM-SCOP) 1 MG/3DAYS Place 1 patch (1 mg total) onto the skin every 3 (three) days. 10 patch 0   No current facility-administered medications on file prior to visit.      Objective:   General:  Alert, oriented and cooperative.   Mental Status: Normal mood and affect perceived. Normal judgment and thought content.   The Rest of physical exam deferred due to type of encounter  Hospital admissions related to diabetes, none recently.    Type 2 DM diagnosed    Anatomy scan scheduled 10/08/2024  Fetal Echo: completed 10/05/2024 and normal, given new OB A1c of 14,  A follow-up fetal echocardiogram is indicated around 30 weeks.     Blood sugar monitoring    Has Dexcom CGM in place.   With CGM, target Blood sugar set at 65-140.   Currently glucose in target range is 64% in target range, making significant progress. Was 0% in target range prior to insulin  pump.    Has back up glucose monitor at home.     Fasting BS:  95-100   2 hour PP mostly less than 120 with continued meal peaks.      Current Diabetes medications:  Omnipod in place, currently using manual mode.    1.40 units per hour 3 am- 9 am> increase to 1.45  units per hour.  1.65 units per hour. 9am-9pm> increase to 1.70 units per hour.  1.70 units per hour 9 pm- 12 am> increase to 1.75 units per hour. 1.60 units per hour 12 am- 3 am> no changes.    Correction factor is set to 18 Carb ratio is 1:5 Target BS is 110   Having a hard time calculating and remembering to bolus for meals.  Sometimes she eats and does not bolus. Or does not eat enough.   Continue metformin  500 mg qAM (forgets sometimes) Had some low readings when taking it.    Plan:   Asthma:  inquiring about refills on her inhaler. Rx sent in for albuterol  and Pulmicort .  Continue settings below.    1.40 units per hour 3 am- 9 am> increase to 1.45 units per hour.  1.65 units per hour. 9am-9pm> increase to 1.70 units per hour.  1.70 units per hour 9 pm- 12 am> increase to 1.75 units per hour. 1.60 units per hour 12 am- 3 am> no changes.    Correction factor is set to 18 Carb ratio is 1:5 Target BS is 110     -Recommend dosing 15- 20 minutes prior to eating.    -Avoid post meal coverage due hypoglycemia.    -Discussed only 30-40 grams of carbohydrates with each meal, with majority of your meals being protein and high fiber vegetables.    -Start a fiber supplement everyday.    -Protein snack before bed. Recommend 10-20 grams of protein before bed.    -Anticipate weekly diabetes visits until BS's are better controlled -Anticipate frequent in person OB visits along with MFM care.  -q4 week growth US  starting at 20w with antenatal weekly testing starting at 32 weeks or sooner if needed.    -Continue BASA 81 mg      This visit is for the purposes of diabetes management only. Please keep scheduled OB visits with OB team for prenatal care.    Preterm labor symptoms and general obstetric precautions including but not limited to vaginal bleeding, contractions, leaking of fluid and fetal movement were reviewed in detail with the patient.  I discussed the assessment and  treatment plan with the patient. The patient was provided an opportunity to ask questions and all were answered. The patient agreed with the plan and demonstrated an understanding of the instructions. The patient was advised to call back or seek an in-person office evaluation/go to MAU at [facility] for any urgent or concerning symptoms. Please refer to After Visit Summary for other counseling recommendations.    I provided 15 minutes of non-face-to-face time during this encounter.   Jakolby Sedivy, Delon FERNS, NP Faculty Practice Center for [employer], [facility]

## 2024-10-05 NOTE — Progress Notes (Signed)
 Assessment: Denise Denise [name] is a G4 P2 at [redacted]w[redacted]d gestation, and by fetal echocardiogram there is a single fetus.  I discussed the findings of the fetal echocardiogram at length with Denise Denise [name], including fetal and postanatal physiology, natural history, potential management options, and the limitations of fetal echocardiography. I explained that maternal diabetes mellitus is associated with a higher relative risk of congenital heart defects, which we fortunately do not see in this case. Poorly controlled maternal diabetes mellitus is also associated with ventricular hypertrophy in the third trimester. Per the [facility] Scientific Statement from 2014 regarding the diagnosis and treatment of fetal cardiac disease, a repeat fetal echocardiogram may be considered in the third trimester if the HbA1c > 6%. Denise [name] last documented A1c was 14. I expressed concern at this value and it's risk for fetal and neonatal ventricular hypertrophy. Therefore, I have asked Denise [name] to return in 2 months for repeat evaluation. I encouraged Denise [name] to continue working on blood sugar control. Denise Denise [name] expressed understanding and had no further questions at this time.   Plan: Based on the information today from the fetal echo today:  1. Delivery recommendation: per MFM/OB's recommendation 2. A follow-up fetal echocardiogram is indicated around 30 wga.  Total time counseling Denise Denise [name] was 15 minutes with an additional 15 minutes utilized to coordinate care for a total of 30 minutes for counseling and coordinating care.   Bernardino Bracket, MD Associate Professor Pediatric Cardiology [employer]  [facility]    HPI Denise Denise [name] is a 33 y.o. female who presents for consultation of fetal cardiac anatomy.  She is [redacted]w[redacted]d gestation (EDD 03/02/24). Pregnancy complicated by history of preterm delivery and diabetes. She is taking insulin  pump and adjusts diet. The pregnancy began as twin gestation but there has been loss of twin B.  Denise [name] last documented A1c was 14.  Family history: There is no family history of congenital heart disease. There is no family history of rhythm disorders including pacemakers or implantable defibrillators. Son has a VP shunt from preterm delivery. Social history: Lives in [place].   Studies: Please see the fetal echocardiogram report for full details.  1. Patent foramen ovale.   2. 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   3. Normal left ventricular cavity size and systolic function.   4. Normal right ventricular cavity size and systolic function.   5. Normal sinus rhythm with 1:1 AV conduction.    The limitations of a fetal echocardiogram were reviewed with Denise.  These include, but are not limited to, the inability to reliably exclude patent ductus arteriosus; some atrial and ventricular septal defects; some arterial anomalies including aortic coarctation; some pulmonary or systemic venous anomalies; and minor valve abnormalities.

## 2024-10-08 ENCOUNTER — Ambulatory Visit: Attending: Obstetrics and Gynecology | Admitting: Obstetrics and Gynecology

## 2024-10-08 ENCOUNTER — Ambulatory Visit

## 2024-10-08 ENCOUNTER — Other Ambulatory Visit: Payer: Self-pay | Admitting: *Deleted

## 2024-10-08 VITALS — BP 129/73 | HR 82

## 2024-10-08 DIAGNOSIS — Z794 Long term (current) use of insulin: Secondary | ICD-10-CM | POA: Diagnosis not present

## 2024-10-08 DIAGNOSIS — O09892 Supervision of other high risk pregnancies, second trimester: Secondary | ICD-10-CM | POA: Diagnosis not present

## 2024-10-08 DIAGNOSIS — O99282 Endocrine, nutritional and metabolic diseases complicating pregnancy, second trimester: Secondary | ICD-10-CM | POA: Diagnosis not present

## 2024-10-08 DIAGNOSIS — O24112 Pre-existing diabetes mellitus, type 2, in pregnancy, second trimester: Secondary | ICD-10-CM

## 2024-10-08 DIAGNOSIS — Z368A Encounter for antenatal screening for other genetic defects: Secondary | ICD-10-CM | POA: Insufficient documentation

## 2024-10-08 DIAGNOSIS — O3432 Maternal care for cervical incompetence, second trimester: Secondary | ICD-10-CM | POA: Diagnosis not present

## 2024-10-08 DIAGNOSIS — O09292 Supervision of pregnancy with other poor reproductive or obstetric history, second trimester: Secondary | ICD-10-CM

## 2024-10-08 DIAGNOSIS — D560 Alpha thalassemia: Secondary | ICD-10-CM | POA: Insufficient documentation

## 2024-10-08 DIAGNOSIS — Z148 Genetic carrier of other disease: Secondary | ICD-10-CM | POA: Insufficient documentation

## 2024-10-08 DIAGNOSIS — O3112X0 Continuing pregnancy after spontaneous abortion of one fetus or more, second trimester, not applicable or unspecified: Secondary | ICD-10-CM

## 2024-10-08 DIAGNOSIS — Z8751 Personal history of pre-term labor: Secondary | ICD-10-CM

## 2024-10-08 DIAGNOSIS — O09212 Supervision of pregnancy with history of pre-term labor, second trimester: Secondary | ICD-10-CM | POA: Insufficient documentation

## 2024-10-08 DIAGNOSIS — Z9641 Presence of insulin pump (external) (internal): Secondary | ICD-10-CM | POA: Insufficient documentation

## 2024-10-08 DIAGNOSIS — Z87898 Personal history of other specified conditions: Secondary | ICD-10-CM | POA: Diagnosis not present

## 2024-10-08 DIAGNOSIS — Z3A19 19 weeks gestation of pregnancy: Secondary | ICD-10-CM | POA: Insufficient documentation

## 2024-10-08 DIAGNOSIS — O285 Abnormal chromosomal and genetic finding on antenatal screening of mother: Secondary | ICD-10-CM | POA: Diagnosis not present

## 2024-10-08 DIAGNOSIS — E119 Type 2 diabetes mellitus without complications: Secondary | ICD-10-CM

## 2024-10-08 DIAGNOSIS — Z79890 Hormone replacement therapy (postmenopausal): Secondary | ICD-10-CM | POA: Insufficient documentation

## 2024-10-08 DIAGNOSIS — Z141 Cystic fibrosis carrier: Secondary | ICD-10-CM | POA: Diagnosis not present

## 2024-10-08 DIAGNOSIS — Z36 Encounter for antenatal screening for chromosomal anomalies: Secondary | ICD-10-CM | POA: Insufficient documentation

## 2024-10-08 DIAGNOSIS — O99212 Obesity complicating pregnancy, second trimester: Secondary | ICD-10-CM | POA: Insufficient documentation

## 2024-10-08 DIAGNOSIS — Z8759 Personal history of other complications of pregnancy, childbirth and the puerperium: Secondary | ICD-10-CM | POA: Diagnosis not present

## 2024-10-08 DIAGNOSIS — E11319 Type 2 diabetes mellitus with unspecified diabetic retinopathy without macular edema: Secondary | ICD-10-CM

## 2024-10-08 DIAGNOSIS — O3110X0 Continuing pregnancy after spontaneous abortion of one fetus or more, unspecified trimester, not applicable or unspecified: Secondary | ICD-10-CM

## 2024-10-08 DIAGNOSIS — O0990 Supervision of high risk pregnancy, unspecified, unspecified trimester: Secondary | ICD-10-CM

## 2024-10-08 DIAGNOSIS — O26879 Cervical shortening, unspecified trimester: Secondary | ICD-10-CM

## 2024-10-08 DIAGNOSIS — F32A Depression, unspecified: Secondary | ICD-10-CM

## 2024-10-08 NOTE — Progress Notes (Signed)
 Maternal-Fetal Medicine Consultation  Name: Denise [name]  MRN: [id number]  GA: [id number] [redacted]w[redacted]d   Patient is here for completion of fetal anatomy and cervical length measurement. - Cervical insufficiency.  Patient had prophylactic cerclage at [redacted] weeks gestation.  Transvaginal ultrasound was recommended at this visit by [facility] after previous ultrasound.  The cervix measured 2.8 cm. - Pregestational diabetes.  Patient is on insulin  pump.  She is being followed by Delon Emms, NP.  Hemoglobin A1c was 14.4% in early pregnancy.  Patient had fetal echocardiography at [facility] that was reported as normal. - Vanishing twin in early pregnancy. - Insufficient fetal DNA.  Ultrasound Fetal biometry is consistent with the previously established dates.  Normal amniotic fluid.  No markers of aneuploidies or obvious fetal structural defects are seen.  Fetal anatomical survey could not be completed because of fetal position.  After explaining, a transvaginal ultrasound was performed by our sonographer.  Patient preferred not to have a female physician in the room.  The cervix measured 2.8 cm (1.4 cm from the cerclage to the external os).  Patient does not want a female physician to perform ultrasound.  Cervical insufficiency I reassured the patient of normal cervical length measurement and that the cerclage is intact.  Placement of cerclage does not guarantee carrying pregnancy to term.  She takes vaginal Prometrium  daily.  Pregestational diabetes I counseled the patient that hemoglobin A1C of 14.4% is associated with at least 20 to 25% chance of congenital malformations (cardiac, spine and central nervous system).  I reassured her that no obvious fetal structural defects were seen at today's ultrasound.  Ultrasound, however, has limitations in detecting fetal anomalies.  I briefly discussed the importance of good blood glucose control to prevent adverse outcomes.  I  discussed ultrasound protocol of monitoring pregnancies complicated by diabetes.  Insufficient fetal DNA I counseled the patient that insufficient fetal DNA is associated with increased risk of chromosomal anomalies.  I discussed the option of amniocentesis that carries a small risk of miscarriage (1 and 500 procedures).  Patient opted not to have amniocentesis.  Patient reports she is in the witness protection program and prefers to be called by the name "Denise [name]."  However, her real name, Denise [name], is in the chart that is used as an identifier before performing ultrasound.  She informed that she will be transferring her care out of [place] but agreed to make an appointment for a follow-up ultrasound, which she will be canceling once her transfer is complete.  Our office manager, Inocente Schneider, talked to her after the patient expressed wish to address some concerns regarding our Department.  Recommendations - An appointment was made for her to return in 5 weeks in 9 weeks for fetal growth assessment.     Consultation including face-to-face (more than 50%) counseling 45 minutes.

## 2024-10-12 ENCOUNTER — Telehealth: Admitting: Obstetrics and Gynecology

## 2024-10-12 ENCOUNTER — Encounter: Payer: Self-pay | Admitting: Obstetrics and Gynecology

## 2024-10-12 DIAGNOSIS — O24112 Pre-existing diabetes mellitus, type 2, in pregnancy, second trimester: Secondary | ICD-10-CM

## 2024-10-12 DIAGNOSIS — Z794 Long term (current) use of insulin: Secondary | ICD-10-CM | POA: Diagnosis not present

## 2024-10-12 DIAGNOSIS — E119 Type 2 diabetes mellitus without complications: Secondary | ICD-10-CM | POA: Diagnosis not present

## 2024-10-12 DIAGNOSIS — O1210 Gestational proteinuria, unspecified trimester: Secondary | ICD-10-CM | POA: Insufficient documentation

## 2024-10-12 DIAGNOSIS — Z3A20 20 weeks gestation of pregnancy: Secondary | ICD-10-CM | POA: Diagnosis not present

## 2024-10-12 MED ORDER — INSULIN LISPRO 100 UNIT/ML IJ SOLN: INTRAMUSCULAR | 11 refills | Status: AC

## 2024-10-12 NOTE — Addendum Note (Signed)
 Addended by: [name] on: 10/12/2024 02:00 PM   Modules accepted: Orders

## 2024-10-12 NOTE — Progress Notes (Signed)
 TELEHEALTH OBSTETRICS VISIT ENCOUNTER NOTE  FOR DIABETES MANAGEMENT DURING PREGNANCY    Provider location: Center for [facility] at [place]   Patient location: Home  I connected with Denise [name] @ on 10/12/24 at  1:10 PM EST by telephone at home and verified that I am speaking with the correct person using two identifiers. Of note, unable to do video encounter due to technical difficulties.    I discussed the limitations, risks, security and privacy concerns of performing an evaluation and management service by telephone and the availability of in person appointments. I also discussed with the patient that there may be a patient responsible charge related to this service. The patient expressed understanding and agreed to proceed.   History:   Denise [name] is a 33 y.o. [id number] at [redacted]w[redacted]d by LMP being seen today for diabetes management during pregnancy.   Patient reports no complaints. Reports fetal movement. Denies any contractions, bleeding or leaking of fluid.   The following portions of the patient's history were reviewed and updated as appropriate: allergies, current medications, past family history, past medical history, past social history, past surgical history and problem list.    Past Medical History:  Diagnosis Date   Allergy    Arthritis    Asthma    Carpal tunnel syndrome of left wrist    Gestational diabetes    History of cervical cerclage 07/28/2013   Rescue cerclage in place     Previous pregnancy with short cervix, but term delivery  This pregnancy, serial cervical lengths:  12/2 cervix long and closed  12/23 cervix 1.4 cm, vaginal progesterone  started  1/6 cervical length 0.2 cm, rescue cerclage was placed     History of pre-eclampsia in prior pregnancy, currently pregnant    Hx of chlamydia infection 2017   Pregnancy induced hypertension    Pre-E   Seasonal allergies    Trichomonas 2017   Past Surgical History:  Procedure Laterality Date    CERVICAL CERCLAGE N/A 11/23/2018   Procedure: CERCLAGE CERVICAL;  Surgeon: Fredirick Glenys RAMAN, MD;  Location: [facility] BIRTHING SUITES;  Service: Gynecology;  Laterality: N/A;   CERVICAL CERCLAGE N/A 08/30/2024   Procedure: CERCLAGE, CERVIX, VAGINAL APPROACH;  Surgeon: Alger Gong, MD;  [facility]: [facility];  Service: Obstetrics;  Laterality: N/A;   DILATION AND CURETTAGE OF UTERUS  05/13/2022   Family History  Problem Relation Age of Onset   Arthritis Mother    Diabetes Mother    Diabetes Maternal Grandmother    Arthritis Maternal Grandmother    Social History   Tobacco Use   Smoking status: Never   Smokeless tobacco: Never  Vaping Use   Vaping status: Never Used  Substance Use Topics   Alcohol use: Not Currently   Drug use: No   Allergies  Allergen Reactions   Farxiga  [Dapagliflozin ] Nausea And Vomiting   Current Outpatient Medications on File Prior to Visit  Medication Sig Dispense Refill   albuterol  (VENTOLIN  HFA) 108 (90 Base) MCG/ACT inhaler Inhale 1-2 puffs into the lungs every 6 (six) hours as needed for wheezing or shortness of breath. 1 each 1   aspirin  EC 81 MG tablet Take 2 tablets (162 mg total) by mouth daily. Swallow whole. 60 tablet 12   Blood Glucose Monitoring Suppl (ACCU-CHEK GUIDE) w/Device KIT Use as advised 1 kit 0   Budesonide  90 MCG/ACT inhaler Inhale 1-2 puffs into the lungs 2 (two) times daily. 1 each 2   Continuous Glucose Sensor (DEXCOM  G7 SENSOR) MISC 1 each by Does not apply route daily. 3 each 3   cyclobenzaprine  (FLEXERIL ) 10 MG tablet Take 1 tablet (10 mg total) by mouth 2 (two) times daily as needed. (Patient not taking: Reported on 09/29/2024) 20 tablet 2   cyclobenzaprine  (FLEXERIL ) 10 MG tablet Take 1 tablet (10 mg total) by mouth 3 (three) times daily as needed for muscle spasms. 30 tablet 2   escitalopram (LEXAPRO) 20 MG tablet Take 20 mg by mouth daily. (Patient not taking: Reported on 09/29/2024)     glucose blood (ACCU-CHEK GUIDE) test strip  Use as instructed 3-4x a day 300 each 3   Insulin  Disposable Pump (OMNIPOD 5 DEXG7G6 PODS GEN 5) MISC Use 1 POD every 2 days 15 each 11   insulin  lispro (HUMALOG ) 100 UNIT/ML injection Max dose 200 units per day. Used for omnipod insulin  pump, please give 30 day supply 10 mL 11   Insulin  Pen Needle 32G X 4 MM MISC Use 4x a day 300 each 3   metoCLOPramide  (REGLAN ) 10 MG tablet Take 1 tablet (10 mg total) by mouth 4 (four) times daily -  before meals and at bedtime. 60 tablet 1   prochlorperazine  (COMPAZINE ) 10 MG tablet Take 1 tablet (10 mg total) by mouth every 6 (six) hours as needed for nausea or vomiting. 120 tablet 0   progesterone  (PROMETRIUM ) 200 MG capsule 400 mg See admin instructions. Taking 200mg  orally at bedtime AND inserting a 200mg  capsule vaginally at bedtime.     promethazine  (PHENERGAN ) 25 MG tablet Take 1 tablet (25 mg total) by mouth every 6 (six) hours as needed. May insert vaginally, if unable to keep anything down by mouth (Patient not taking: Reported on 09/29/2024) 120 tablet 0   scopolamine  (TRANSDERM-SCOP) 1 MG/3DAYS Place 1 patch (1 mg total) onto the skin every 3 (three) days. 10 patch 0   No current facility-administered medications on file prior to visit.   Objective:   General:  Alert, oriented and cooperative.   Mental Status: Normal mood and affect perceived. Normal judgment and thought content.   The Rest of physical exam deferred due to type of encounter  Hospital admissions related to diabetes: none recently.    Type 2 DM diagnosed.     Anatomy scan: Girl, EFW 34%tile    Fetal Echo: needs follow up @ 30 weeks per cardiologist.     Blood sugar monitoring:    Has Dexcom G7 in place with data sharing set up.   With CGM, target Blood sugar set at 65-140.   Currently glucose in target range is 58% in target range    Has back up glucose monitor     Fasting BS 90-110   2 hour PP Continued elevations with overall baseline elevated.      Current  Diabetes medications:    1.45 units per hour 3 am- 9 am> increase to 1.50 units per hour.  1.65 units per hour. 9am-9pm> increase to 1.70 units per hour.  1.75 units per hour 9 pm- 12 am> increase to 1.80 units per hour. 1.70 units per hour 12 am- 3 am>  increase to 1.75 units per hour.    Correction factor is set to 18 Carb ratio is 1:5 Target BS is 110   Plan:   -Continue settings above.   -Recommend dosing 15- 20 minutes prior to eating.    -Avoid post meal coverage due hypoglycemia.    -Discussed only 30-40 grams of carbohydrates with each meal,  with majority of your meals being protein and high fiber vegetables.    -Start a fiber supplement everyday.    -Protein snack before bed. Recommend 10-20 grams of protein before bed.    -Anticipate weekly diabetes visits until BS's are better controlled -Anticipate frequent in person OB visits along with MFM care.  -q4 week growth US  starting at 20w with antenatal weekly testing starting at 32 weeks or sooner if needed.    -Continue BASA 81 mg      This visit is for the purposes of diabetes management only. Please keep scheduled OB visits with OB team for prenatal care.    Preterm labor symptoms and general obstetric precautions including but not limited to vaginal bleeding, contractions, leaking of fluid and fetal movement were reviewed in detail with the patient.  I discussed the assessment and treatment plan with the patient. The patient was provided an opportunity to ask questions and all were answered. The patient agreed with the plan and demonstrated an understanding of the instructions. The patient was advised to call back or seek an in-person office evaluation/go to MAU at [facility] for any urgent or concerning symptoms. Please refer to After Visit Summary for other counseling recommendations.    I provided 15 minutes of non-face-to-face time during this encounter.   Kynleigh Artz, Delon FERNS, NP Faculty  Practice Center for [employer], [facility]

## 2024-10-13 ENCOUNTER — Encounter: Admitting: Certified Nurse Midwife

## 2024-10-19 ENCOUNTER — Telehealth: Admitting: Obstetrics and Gynecology

## 2024-10-19 DIAGNOSIS — O24112 Pre-existing diabetes mellitus, type 2, in pregnancy, second trimester: Secondary | ICD-10-CM | POA: Diagnosis not present

## 2024-10-19 DIAGNOSIS — E119 Type 2 diabetes mellitus without complications: Secondary | ICD-10-CM | POA: Diagnosis not present

## 2024-10-19 DIAGNOSIS — Z9641 Presence of insulin pump (external) (internal): Secondary | ICD-10-CM

## 2024-10-19 DIAGNOSIS — Z3A21 21 weeks gestation of pregnancy: Secondary | ICD-10-CM | POA: Diagnosis not present

## 2024-10-19 NOTE — Progress Notes (Signed)
 TELEHEALTH OBSTETRICS VISIT ENCOUNTER NOTE  FOR DIABETES MANAGEMENT DURING PREGNANCY    Provider location: Center for [facility]   Patient location: Home  I connected with Denise [name] on 10/19/24 at  4:10 PM EST by telephone at home and verified that I am speaking with the correct person using two identifiers. Of note, unable to do video encounter due to technical difficulties.    I discussed the limitations, risks, security and privacy concerns of performing an evaluation and management service by telephone and the availability of in person appointments. I also discussed with the patient that there may be a patient responsible charge related to this service. The patient expressed understanding and agreed to proceed.   History:   Denise [name] is a 33 y.o. H5E8887 at [redacted]w[redacted]d by LMP being seen today for diabetes management during pregnancy.  Patient reports no complaints. Reports fetal movement. Denies any contractions, bleeding or leaking of fluid.   The following portions of the patient's history were reviewed and updated as appropriate: allergies, current medications, past family history, past medical history, past social history, past surgical history and problem list.   Past Medical History:  Diagnosis Date   Allergy    Arthritis    Asthma    Carpal tunnel syndrome of left wrist    Gestational diabetes    History of cervical cerclage 07/28/2013   Rescue cerclage in place     Previous pregnancy with short cervix, but term delivery  This pregnancy, serial cervical lengths:  12/2 cervix long and closed  12/23 cervix 1.4 cm, vaginal progesterone  started  1/6 cervical length 0.2 cm, rescue cerclage was placed     History of pre-eclampsia in prior pregnancy, currently pregnant    Hx of chlamydia infection 2017   Pregnancy induced hypertension    Pre-E   Seasonal allergies    Trichomonas 2017   Past Surgical History:  Procedure Laterality Date    CERVICAL CERCLAGE N/A 11/23/2018   Procedure: CERCLAGE CERVICAL;  Surgeon: Fredirick Glenys RAMAN, MD;  Location: [facility] BIRTHING SUITES;  Service: Gynecology;  Laterality: N/A;   CERVICAL CERCLAGE N/A 08/30/2024   Procedure: CERCLAGE, CERVIX, VAGINAL APPROACH;  Surgeon: Alger Gong, MD;  [facility]: [facility];  Service: Obstetrics;  Laterality: N/A;   DILATION AND CURETTAGE OF UTERUS  05/13/2022   Family History  Problem Relation Age of Onset   Arthritis Mother    Diabetes Mother    Diabetes Maternal Grandmother    Arthritis Maternal Grandmother    Social History   Tobacco Use   Smoking status: Never   Smokeless tobacco: Never  Vaping Use   Vaping status: Never Used  Substance Use Topics   Alcohol use: Not Currently   Drug use: No   Allergies  Allergen Reactions   Farxiga  [Dapagliflozin ] Nausea And Vomiting   Current Outpatient Medications on File Prior to Visit  Medication Sig Dispense Refill   albuterol  (VENTOLIN  HFA) 108 (90 Base) MCG/ACT inhaler Inhale 1-2 puffs into the lungs every 6 (six) hours as needed for wheezing or shortness of breath. 1 each 1   aspirin  EC 81 MG tablet Take 2 tablets (162 mg total) by mouth daily. Swallow whole. 60 tablet 12   Blood Glucose Monitoring Suppl (ACCU-CHEK GUIDE) w/Device KIT Use as advised 1 kit 0   Budesonide  90 MCG/ACT inhaler Inhale 1-2 puffs into the lungs 2 (two) times daily. 1 each 2   Continuous Glucose Sensor (DEXCOM G7 SENSOR) MISC  1 each by Does not apply route daily. 3 each 3   cyclobenzaprine  (FLEXERIL ) 10 MG tablet Take 1 tablet (10 mg total) by mouth 2 (two) times daily as needed. (Patient not taking: Reported on 09/29/2024) 20 tablet 2   cyclobenzaprine  (FLEXERIL ) 10 MG tablet Take 1 tablet (10 mg total) by mouth 3 (three) times daily as needed for muscle spasms. 30 tablet 2   escitalopram (LEXAPRO) 20 MG tablet Take 20 mg by mouth daily. (Patient not taking: Reported on 09/29/2024)     glucose blood (ACCU-CHEK GUIDE) test strip  Use as instructed 3-4x a day 300 each 3   Insulin  Disposable Pump (OMNIPOD 5 DEXG7G6 PODS GEN 5) MISC Use 1 POD every 2 days 15 each 11   insulin  lispro (HUMALOG ) 100 UNIT/ML injection Max dose 200 units per day. Used for omnipod insulin  pump, please give 30 day supply 10 mL 11   Insulin  Pen Needle 32G X 4 MM MISC Use 4x a day 300 each 3   metoCLOPramide  (REGLAN ) 10 MG tablet Take 1 tablet (10 mg total) by mouth 4 (four) times daily -  before meals and at bedtime. 60 tablet 1   prochlorperazine  (COMPAZINE ) 10 MG tablet Take 1 tablet (10 mg total) by mouth every 6 (six) hours as needed for nausea or vomiting. 120 tablet 0   progesterone  (PROMETRIUM ) 200 MG capsule 400 mg See admin instructions. Taking 200mg  orally at bedtime AND inserting a 200mg  capsule vaginally at bedtime.     promethazine  (PHENERGAN ) 25 MG tablet Take 1 tablet (25 mg total) by mouth every 6 (six) hours as needed. May insert vaginally, if unable to keep anything down by mouth (Patient not taking: Reported on 09/29/2024) 120 tablet 0   scopolamine  (TRANSDERM-SCOP) 1 MG/3DAYS Place 1 patch (1 mg total) onto the skin every 3 (three) days. 10 patch 0   No current facility-administered medications on file prior to visit.      Objective:   General:  Alert, oriented and cooperative.   Mental Status: Normal mood and affect perceived. Normal judgment and thought content.   The Rest of physical exam deferred due to type of encounter  Hospital admissions related to diabetes, none recently.    Patient with uncontrolled Type 2 DM    Anatomy scan EFW 34% Cervix 2.76 cm with cerclage in place.    A1c at the time of diagnoses. 14%   Most recent HgA1c: needs collected.   Fetal Echo:  Normal, done at [facility].      Blood sugar monitoring:  Has Dexcom CGM in place with data sharing set up with our office.    With CGM, target Blood sugar set at 65-140.   Currently glucose in target range is 42% in target range, blood sugars have  increased significantly    Fasting BS 120's   2 hour PP mostly greater than 120.  Overall baseline elevated.      Current Diabetes medications:  Omnipod insulin  pump in place Recently ran out of omnipods and has been without insulin .  Restarted insulin  on Sunday 10/17/2024  1.50 units per hour 3 am- 9 am> 1.70 units per hour. 9am-9pm> 1.80 units per hour 9 pm- 12 am>  1.75 units per hour 12 am- 3 am>   Correction factor is set to 18 Carb ratio is 1:5 Target BS is 110  No changes this week due to just restarting insulin  and [name] reporting some low BS readings specifically post meals.   Will message [name]  Friday through mychart with changes.  Encouraged [name] to add more protein to meals and snacks. She is craving high carb foods like pizza and chips. Add protein to all meals and snacks.      Plan:   Continue below   1.50 units per hour 3 am- 9 am> 1.70 units per hour. 9am-9pm> 1.80 units per hour 9 pm- 12 am>  1.75 units per hour 12 am- 3 am>  Correction factor is set to 18 Carb ratio is 1:5 Target BS is 110    -Recommend dosing 15- 20 minutes prior to eating.    -Avoid post meal coverage due hypoglycemia.    -Discussed only 30-40 grams of carbohydrates with each meal, with majority of your meals being protein and high fiber vegetables.    -Start a fiber supplement everyday.    -Protein snack before bed. Recommend 10-20 grams of protein before bed.    -Anticipate weekly diabetes visits until BS's are better controlled -Anticipate frequent in person OB visits along with MFM care.  -q4 week growth US  starting at 20w with antenatal weekly testing starting at 32 weeks or sooner if needed.    -Continue BASA 81 mg      This visit is for the purposes of diabetes management only. Please keep scheduled OB visits with OB team for prenatal care.    Preterm labor symptoms and general obstetric precautions including but not limited to vaginal bleeding, contractions,  leaking of fluid and fetal movement were reviewed in detail with the patient.  I discussed the assessment and treatment plan with the patient. The patient was provided an opportunity to ask questions and all were answered. The patient agreed with the plan and demonstrated an understanding of the instructions. The patient was advised to call back or seek an in-person office evaluation/go to [facility]U at [facility] for any urgent or concerning symptoms. Please refer to After Visit Summary for other counseling recommendations.    I provided 15 minutes of non-face-to-face time during this encounter.   Twinkle Sockwell, Delon FERNS, NP Faculty Practice Center for [employer], [facility]

## 2024-10-20 ENCOUNTER — Other Ambulatory Visit: Payer: Self-pay

## 2024-10-20 ENCOUNTER — Ambulatory Visit: Admitting: Certified Nurse Midwife

## 2024-10-20 VITALS — BP 123/74 | HR 79 | Wt 252.0 lb

## 2024-10-20 DIAGNOSIS — Z87898 Personal history of other specified conditions: Secondary | ICD-10-CM | POA: Diagnosis not present

## 2024-10-20 DIAGNOSIS — Z3A21 21 weeks gestation of pregnancy: Secondary | ICD-10-CM

## 2024-10-20 DIAGNOSIS — O0992 Supervision of high risk pregnancy, unspecified, second trimester: Secondary | ICD-10-CM

## 2024-10-20 DIAGNOSIS — O24112 Pre-existing diabetes mellitus, type 2, in pregnancy, second trimester: Secondary | ICD-10-CM

## 2024-10-21 ENCOUNTER — Ambulatory Visit: Payer: Self-pay | Admitting: Certified Nurse Midwife

## 2024-10-21 LAB — HEMOGLOBIN A1C
Est. average glucose Bld gHb Est-mCnc: 189 mg/dL
Hgb A1c MFr Bld: 8.2 % — ABNORMAL HIGH (ref 4.8–5.6)
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
## 2024-10-26 ENCOUNTER — Telehealth: Admitting: Obstetrics and Gynecology

## 2024-10-26 DIAGNOSIS — Z794 Long term (current) use of insulin: Secondary | ICD-10-CM | POA: Diagnosis not present

## 2024-10-26 DIAGNOSIS — E119 Type 2 diabetes mellitus without complications: Secondary | ICD-10-CM

## 2024-10-26 DIAGNOSIS — Z3A22 22 weeks gestation of pregnancy: Secondary | ICD-10-CM | POA: Diagnosis not present

## 2024-10-26 DIAGNOSIS — O24112 Pre-existing diabetes mellitus, type 2, in pregnancy, second trimester: Secondary | ICD-10-CM | POA: Diagnosis not present

## 2024-10-26 NOTE — Progress Notes (Signed)
 TELEHEALTH OBSTETRICS VISIT ENCOUNTER NOTE  FOR DIABETES MANAGEMENT DURING PREGNANCY    Provider location: Center for [facility]   Patient location: Home  I connected with [name] on 10/26/24 at  4:10 PM EST by telephone at home and verified that I am speaking with the correct person using two identifiers. Of note, unable to do video encounter due to technical difficulties.    I discussed the limitations, risks, security and privacy concerns of performing an evaluation and management service by telephone and the availability of in person appointments. I also discussed with the patient that there may be a patient responsible charge related to this service. The patient expressed understanding and agreed to proceed.   History:   Denise [name] is a 33 y.o. H5E8887 at [redacted]w[redacted]d by LMP being seen today for diabetes management during pregnancy.   Patient reports no complaints. Reports fetal movement. Denies any contractions, bleeding or leaking of fluid.   The following portions of the patient's history were reviewed and updated as appropriate: allergies, current medications, past family history, past medical history, past social history, past surgical history and problem list.   Past Medical History:  Diagnosis Date   Allergy    Arthritis    Asthma    Carpal tunnel syndrome of left wrist    Gestational diabetes    History of cervical cerclage 07/28/2013   Rescue cerclage in place     Previous pregnancy with short cervix, but term delivery  This pregnancy, serial cervical lengths:  12/2 cervix long and closed  12/23 cervix 1.4 cm, vaginal progesterone  started  1/6 cervical length 0.2 cm, rescue cerclage was placed     History of pre-eclampsia in prior pregnancy, currently pregnant    Hx of chlamydia infection 2017   Pregnancy induced hypertension    Pre-E   Seasonal allergies    Trichomonas 2017   Past Surgical History:  Procedure Laterality Date   CERVICAL  CERCLAGE N/A 11/23/2018   Procedure: CERCLAGE CERVICAL;  Surgeon: Fredirick Glenys RAMAN, MD;  Location: [facility] BIRTHING SUITES;  Service: Gynecology;  Laterality: N/A;   CERVICAL CERCLAGE N/A 08/30/2024   Procedure: CERCLAGE, CERVIX, VAGINAL APPROACH;  Surgeon: Alger Gong, MD;  Location: [facility];  Service: Obstetrics;  Laterality: N/A;   DILATION AND CURETTAGE OF UTERUS  05/13/2022   Family History  Problem Relation Age of Onset   Arthritis Mother    Diabetes Mother    Diabetes Maternal Grandmother    Arthritis Maternal Grandmother    Social History   Tobacco Use   Smoking status: Never   Smokeless tobacco: Never  Vaping Use   Vaping status: Never Used  Substance Use Topics   Alcohol use: Not Currently   Drug use: No   Allergies  Allergen Reactions   Farxiga  [Dapagliflozin ] Nausea And Vomiting   Current Outpatient Medications on File Prior to Visit  Medication Sig Dispense Refill   albuterol  (VENTOLIN  HFA) 108 (90 Base) MCG/ACT inhaler Inhale 1-2 puffs into the lungs every 6 (six) hours as needed for wheezing or shortness of breath. 1 each 1   aspirin  EC 81 MG tablet Take 2 tablets (162 mg total) by mouth daily. Swallow whole. 60 tablet 12   Blood Glucose Monitoring Suppl (ACCU-CHEK GUIDE) w/Device KIT Use as advised 1 kit 0   Budesonide  90 MCG/ACT inhaler Inhale 1-2 puffs into the lungs 2 (two) times daily. 1 each 2   Continuous Glucose Sensor (DEXCOM G7 SENSOR) MISC 1  each by Does not apply route daily. 3 each 3   cyclobenzaprine  (FLEXERIL ) 10 MG tablet Take 1 tablet (10 mg total) by mouth 2 (two) times daily as needed. (Patient not taking: Reported on 10/20/2024) 20 tablet 2   cyclobenzaprine  (FLEXERIL ) 10 MG tablet Take 1 tablet (10 mg total) by mouth 3 (three) times daily as needed for muscle spasms. 30 tablet 2   escitalopram (LEXAPRO) 20 MG tablet Take 20 mg by mouth daily. (Patient not taking: Reported on 10/20/2024)     glucose blood (ACCU-CHEK GUIDE) test strip Use as  instructed 3-4x a day 300 each 3   Insulin  Disposable Pump (OMNIPOD 5 DEXG7G6 PODS GEN 5) MISC Use 1 POD every 2 days 15 each 11   insulin  lispro (HUMALOG ) 100 UNIT/ML injection Max dose 200 units per day. Used for omnipod insulin  pump, please give 30 day supply 10 mL 11   Insulin  Pen Needle 32G X 4 MM MISC Use 4x a day 300 each 3   metoCLOPramide  (REGLAN ) 10 MG tablet Take 1 tablet (10 mg total) by mouth 4 (four) times daily -  before meals and at bedtime. 60 tablet 1   prochlorperazine  (COMPAZINE ) 10 MG tablet Take 1 tablet (10 mg total) by mouth every 6 (six) hours as needed for nausea or vomiting. 120 tablet 0   progesterone  (PROMETRIUM ) 200 MG capsule 400 mg See admin instructions. Taking 200mg  orally at bedtime AND inserting a 200mg  capsule vaginally at bedtime.     promethazine  (PHENERGAN ) 25 MG tablet Take 1 tablet (25 mg total) by mouth every 6 (six) hours as needed. May insert vaginally, if unable to keep anything down by mouth (Patient not taking: Reported on 10/20/2024) 120 tablet 0   scopolamine  (TRANSDERM-SCOP) 1 MG/3DAYS Place 1 patch (1 mg total) onto the skin every 3 (three) days. 10 patch 0   No current facility-administered medications on file prior to visit.      Objective:   General:  Alert, oriented and cooperative.   Mental Status: Normal mood and affect perceived. Normal judgment and thought content.   The Rest of physical exam deferred due to type of encounter   Hospital admissions related to diabetes, none recently.    Patient with uncontrolled Type 2 DM    Anatomy scan EFW 34%, f/u scheduled for 11/15/2024  Cervix 2.76 cm with cerclage in place.    A1c at the time of diagnoses. 14%   Most recent HgA1c: 8.2 %   Fetal Echo:  Normal, [facility].     Blood sugar monitoring: Dexcom in place.   With CGM, target Blood sugar set at 65-140.   Currently glucose in target range is 41% in target range   Using 32% basal and Bolus 68%  Has Back up glucose  monitor at home.     Fasting BS some days less than 95.    2 hour PP with prolonged elevations. Overall baseline elevated.    Not bolusing for full meals due to concerns for hypoglycemia.    Current Diabetes medications:   Omnipod insulin  pump in place   1.50 units per hour 3 am- 9 am> increase to 1.60 units per hour.  1.70 units per hour. 9am-9pm> increase to 1.80 units per hour.  1.80 units per hour 9 pm- 12 am> Increase to 1.90 units per hour.  1.85 units per hour 12 am- 3 am> increase to 1.90 units per hour.    Correction factor is set to 18 Carb ratio  is 1:5 Target BS is 110   Plan:   -Continue above.   -Recommend dosing 15- 20 minutes prior to eating.    -Avoid post meal coverage due hypoglycemia.    -Discussed only 30-40 grams of carbohydrates with each meal, with majority of your meals being protein and high fiber vegetables.    -Start a fiber supplement everyday.    -Protein snack before bed. Recommend 10-20 grams of protein before bed.    -Anticipate weekly diabetes visits until BS's are better controlled -Anticipate frequent in person OB visits along with MFM care.  -q4 week growth US  starting at 20w with antenatal weekly testing starting at 32 weeks or sooner if needed.    -Continue BASA 81 mg      This visit is for the purposes of diabetes management only. Please keep scheduled OB visits with OB team for prenatal care.    Preterm labor symptoms and general obstetric precautions including but not limited to vaginal bleeding, contractions, leaking of fluid and fetal movement were reviewed in detail with the patient.  I discussed the assessment and treatment plan with the patient. The patient was provided an opportunity to ask questions and all were answered. The patient agreed with the plan and demonstrated an understanding of the instructions. The patient was advised to call back or seek an in-person office evaluation/go to MAU at [facility]  for any urgent or concerning symptoms. Please refer to After Visit Summary for other counseling recommendations.    I provided 15 minutes of non-face-to-face time during this encounter.   Avin Gibbons, Delon FERNS, NP Faculty Practice Center for [employer], [facility]

## 2024-10-27 ENCOUNTER — Encounter: Admitting: Certified Nurse Midwife

## 2024-10-27 ENCOUNTER — Other Ambulatory Visit: Payer: Self-pay

## 2024-10-27 ENCOUNTER — Ambulatory Visit: Admitting: Certified Nurse Midwife

## 2024-10-27 VITALS — BP 145/89 | HR 79 | Wt 254.0 lb

## 2024-10-27 DIAGNOSIS — O0992 Supervision of high risk pregnancy, unspecified, second trimester: Secondary | ICD-10-CM

## 2024-10-27 DIAGNOSIS — O10912 Unspecified pre-existing hypertension complicating pregnancy, second trimester: Secondary | ICD-10-CM

## 2024-10-27 DIAGNOSIS — Z3A22 22 weeks gestation of pregnancy: Secondary | ICD-10-CM

## 2024-10-27 DIAGNOSIS — O10919 Unspecified pre-existing hypertension complicating pregnancy, unspecified trimester: Secondary | ICD-10-CM

## 2024-10-28 MED ORDER — NIFEDIPINE ER OSMOTIC RELEASE 30 MG PO TB24: 30.0000 mg | ORAL_TABLET | Freq: Every day | ORAL | 3 refills | Status: AC

## 2024-10-28 NOTE — Progress Notes (Unsigned)
 nor

## 2024-10-29 ENCOUNTER — Telehealth: Payer: Self-pay | Admitting: Obstetrics and Gynecology

## 2024-10-29 DIAGNOSIS — O10919 Unspecified pre-existing hypertension complicating pregnancy, unspecified trimester: Secondary | ICD-10-CM

## 2024-10-29 DIAGNOSIS — I10 Essential (primary) hypertension: Secondary | ICD-10-CM

## 2024-10-29 HISTORY — DX: Essential (primary) hypertension: I10

## 2024-10-29 NOTE — Telephone Encounter (Signed)
 Denise [name] sent a message regarding her new BP medication. She was seen in the [facility] office yesterday and counseled regarding concerns for chronic hypertension. She was prescribed procardia  30 mg XL and had questions regarding side effects etc.   It was recommended she have PIH labs collected prior to starting BP medication, however her visit got over at 5:15 and the lab was closed. Discussed the importance of PIH labs.   All questions answered. She will start checking her BP at home and keep a log.  PIH labs ordered, [name] will have these drawn at her convenience.   [name], NP 10/29/2024 2:03 PM

## 2024-11-02 ENCOUNTER — Other Ambulatory Visit: Payer: Self-pay

## 2024-11-02 ENCOUNTER — Other Ambulatory Visit

## 2024-11-02 ENCOUNTER — Ambulatory Visit (INDEPENDENT_AMBULATORY_CARE_PROVIDER_SITE_OTHER)

## 2024-11-02 ENCOUNTER — Telehealth: Admitting: Obstetrics and Gynecology

## 2024-11-02 VITALS — BP 132/80 | HR 83

## 2024-11-02 DIAGNOSIS — Z87898 Personal history of other specified conditions: Secondary | ICD-10-CM

## 2024-11-02 DIAGNOSIS — O24112 Pre-existing diabetes mellitus, type 2, in pregnancy, second trimester: Secondary | ICD-10-CM

## 2024-11-02 DIAGNOSIS — F419 Anxiety disorder, unspecified: Secondary | ICD-10-CM

## 2024-11-02 DIAGNOSIS — O1212 Gestational proteinuria, second trimester: Secondary | ICD-10-CM

## 2024-11-02 DIAGNOSIS — Z9641 Presence of insulin pump (external) (internal): Secondary | ICD-10-CM | POA: Diagnosis not present

## 2024-11-02 DIAGNOSIS — Z794 Long term (current) use of insulin: Secondary | ICD-10-CM

## 2024-11-02 DIAGNOSIS — I10 Essential (primary) hypertension: Secondary | ICD-10-CM

## 2024-11-02 DIAGNOSIS — O10919 Unspecified pre-existing hypertension complicating pregnancy, unspecified trimester: Secondary | ICD-10-CM

## 2024-11-02 DIAGNOSIS — Z3A23 23 weeks gestation of pregnancy: Secondary | ICD-10-CM

## 2024-11-02 DIAGNOSIS — E119 Type 2 diabetes mellitus without complications: Secondary | ICD-10-CM

## 2024-11-02 DIAGNOSIS — O0990 Supervision of high risk pregnancy, unspecified, unspecified trimester: Secondary | ICD-10-CM

## 2024-11-02 DIAGNOSIS — O0992 Supervision of high risk pregnancy, unspecified, second trimester: Secondary | ICD-10-CM

## 2024-11-02 NOTE — Progress Notes (Signed)
 TELEHEALTH OBSTETRICS VISIT ENCOUNTER NOTE  FOR DIABETES MANAGEMENT DURING PREGNANCY    Provider location: Center for [facility] [place]   Patient location: Home  I connected with Denise [name] Her on 11/02/2024 at  4:10 PM EST by telephone at home and verified that I am speaking with the correct person using two identifiers. Of note, unable to do video encounter due to technical difficulties.    I discussed the limitations, risks, security and privacy concerns of performing an evaluation and management service by telephone and the availability of in person appointments. I also discussed with the patient that there may be a patient responsible charge related to this service. The patient expressed understanding and agreed to proceed.   History:   Denise [name] is a 33 y.o. H5E8887 at [redacted]w[redacted]d by LMP being seen today for diabetes management during pregnancy.   Patient reports no complaints. Reports fetal movement. Denies any contractions, bleeding or leaking of fluid.   The following portions of the patient's history were reviewed and updated as appropriate: allergies, current medications, past family history, past medical history, past social history, past surgical history and problem list.   Past Medical History:  Diagnosis Date   Allergy    Arthritis    Asthma    Carpal tunnel syndrome of left wrist    Gestational diabetes    History of cervical cerclage 07/28/2013   Rescue cerclage in place     Previous pregnancy with short cervix, but term delivery  This pregnancy, serial cervical lengths:  12/2 cervix long and closed  12/23 cervix 1.4 cm, vaginal progesterone  started  1/6 cervical length 0.2 cm, rescue cerclage was placed     History of pre-eclampsia in prior pregnancy, currently pregnant    Hx of chlamydia infection 2017   Pregnancy induced hypertension    Pre-E   Seasonal allergies    Trichomonas 2017   Past Surgical History:  Procedure Laterality Date    CERVICAL CERCLAGE N/A 11/23/2018   Procedure: CERCLAGE CERVICAL;  Surgeon: Fredirick Glenys RAMAN, MD;  Location: [facility] BIRTHING SUITES;  Service: Gynecology;  Laterality: N/A;   CERVICAL CERCLAGE N/A 08/30/2024   Procedure: CERCLAGE, CERVIX, VAGINAL APPROACH;  Surgeon: Alger Gong, MD;  [facility]: [facility]S;  Service: Obstetrics;  Laterality: N/A;   DILATION AND CURETTAGE OF UTERUS  05/13/2022   Family History  Problem Relation Age of Onset   Arthritis Mother    Diabetes Mother    Diabetes Maternal Grandmother    Arthritis Maternal Grandmother    Social History[1] Allergies[2] Medications Ordered Prior to Encounter[3]    Objective:   General:  Alert, oriented and cooperative.   Mental Status: Normal mood and affect perceived. Normal judgment and thought content.    The Rest of physical exam deferred due to type of encounter   Hospital admissions related to diabetes, none recently.    Patient with uncontrolled Type 2 DM    Anatomy scan EFW 34%, f/u scheduled for 11/15/2024   Cervix 2.76 cm with cerclage in place.    A1c at the time of diagnoses. 14%   Most recent HgA1c: 8.2 %   Fetal Echo:  Normal, done at [facility].     Blood sugar monitoring    Has Omnipod and Dexcom  With CGM, target Blood sugar set at 65-140.   Currently glucose in target range is 43% in target range  Had a few days without her omnipod.   Using 38% basal and Bolus 62%  improved.    Has Back up glucose monitor at home.     Fasting BS  120's-130's   2 hour PP elevated baseline at all time points  Having food aversions. Eating sugary foods like donuts and coffee/ limited protein.      Current Diabetes medications:   Omnipod insulin  pump in place> Manual mode.    1.60 units per hour 3 am- 9 am> increase to 1.70 units per hour.  1.80 units per hour. 9am-9pm> increase to 1.90 units per hour.  1.90 units per hour 9 pm- 12 am> Increase to 2.0 units per hour.  1.95 units per hour 12 am- 3 am>  increase to 2.0 units per hour.    Correction factor is set to 18 Carb ratio is 1:5 Target BS is 110   Plan:   - Would like to transfer OB care to [facility]. [facility]  - Had baseline PIH labs collected today - Planning to start procardia  30 mg for BP management. (Rx by primary OB) - Will continue to check BP at home. Will request BP cuff from primary OB.  - BP in the office today 130/80 (there for blood work)  -Was previously on Lexapro 20 mg daily, stopped and wanting to restart. Ok to restart 10 mg and will reevaluate in 2 weeks. Likely will increase to 20 mg daily.   -Continue above  - Recommend 3 meals and 3 snacks.   -Recommend dosing 15- 20 minutes prior to eating.    -Avoid post meal coverage due hypoglycemia.    -Discussed only 30-40 grams of carbohydrates with each meal, with majority of your meals being protein and high fiber vegetables.    -Start a fiber supplement everyday.    -Protein snack before bed. Recommend 10-20 grams of protein before bed.    -Anticipate weekly diabetes visits until BS's are better controlled -Anticipate frequent in person OB visits along with MFM care.  -q4 week growth US  starting at 20w with antenatal weekly testing starting at 32 weeks or sooner if needed.    -Continue BASA 81 mg      This visit is for the purposes of diabetes management only. Please keep scheduled OB visits with OB team for prenatal care.    Preterm labor symptoms and general obstetric precautions including but not limited to vaginal bleeding, contractions, leaking of fluid and fetal movement were reviewed in detail with the patient.  I discussed the assessment and treatment plan with the patient. The patient was provided an opportunity to ask questions and all were answered. The patient agreed with the plan and demonstrated an understanding of the instructions. The patient was advised to call back or seek an  in-person office evaluation/go to MAU at [facility] for any urgent or concerning symptoms. Please refer to After Visit Summary for other counseling recommendations.    I provided 15 minutes of non-face-to-face time during this encounter.   Winter Trefz, Delon FERNS, NP Faculty Practice Center for [employer], [facility]         [1]  Social History Tobacco Use   Smoking status: Never   Smokeless tobacco: Never  Vaping Use   Vaping status: Never Used  Substance Use Topics   Alcohol use: Not Currently   Drug use: No  [2]  Allergies Allergen Reactions   Farxiga  [Dapagliflozin ] Nausea And Vomiting  [3]  Current Outpatient Medications on File Prior to Visit  Medication Sig Dispense Refill   albuterol  (VENTOLIN  HFA) 108 (90 Base) MCG/ACT inhaler Inhale 1-2 puffs into the lungs every 6 (six) hours as needed for wheezing or shortness of breath. 1 each 1   aspirin  EC 81 MG tablet Take 2 tablets (162 mg total) by mouth daily. Swallow whole. 60 tablet 12   Blood Glucose Monitoring Suppl (ACCU-CHEK GUIDE) w/Device KIT Use as advised 1 kit 0   Budesonide  90 MCG/ACT inhaler Inhale 1-2 puffs into the lungs 2 (two) times daily. 1 each 2   Continuous Glucose Sensor (DEXCOM G7 SENSOR) MISC 1 each by Does not apply route daily. 3 each 3   cyclobenzaprine  (FLEXERIL ) 10 MG tablet Take 1 tablet (10 mg total) by mouth 2 (two) times daily as needed. (Patient not taking: Reported on 10/27/2024) 20 tablet 2   cyclobenzaprine  (FLEXERIL ) 10 MG tablet Take 1 tablet (10 mg total) by mouth 3 (three) times daily as needed for muscle spasms. 30 tablet 2   escitalopram (LEXAPRO) 20 MG tablet Take 20 mg by mouth daily. (Patient not taking: Reported on 10/27/2024)     glucose blood (ACCU-CHEK GUIDE) test strip Use as instructed 3-4x a day 300 each 3   Insulin  Disposable Pump (OMNIPOD 5 DEXG7G6 PODS GEN 5) MISC Use 1 POD every 2 days 15 each 11   insulin  lispro (HUMALOG ) 100  UNIT/ML injection Max dose 200 units per day. Used for omnipod insulin  pump, please give 30 day supply 10 mL 11   Insulin  Pen Needle 32G X 4 MM MISC Use 4x a day 300 each 3   metoCLOPramide  (REGLAN ) 10 MG tablet Take 1 tablet (10 mg total) by mouth 4 (four) times daily -  before meals and at bedtime. 60 tablet 1   NIFEdipine  (PROCARDIA  XL) 30 MG 24 hr tablet Take 1 tablet (30 mg total) by mouth daily. 60 tablet 3   prochlorperazine  (COMPAZINE ) 10 MG tablet Take 1 tablet (10 mg total) by mouth every 6 (six) hours as needed for nausea or vomiting. 120 tablet 0   progesterone  (PROMETRIUM ) 200 MG capsule 400 mg See admin instructions. Taking 200mg  orally at bedtime AND inserting a 200mg  capsule vaginally at bedtime.     promethazine  (PHENERGAN ) 25 MG tablet Take 1 tablet (25 mg total) by mouth every 6 (six) hours as needed. May insert vaginally, if unable to keep anything down by mouth (Patient not taking: Reported on 10/27/2024) 120 tablet 0   scopolamine  (TRANSDERM-SCOP) 1 MG/3DAYS Place 1 patch (1 mg total) onto the skin every 3 (three) days. 10 patch 0   No current facility-administered medications on file prior to visit.

## 2024-11-02 NOTE — Progress Notes (Unsigned)
 Blood Pressure Check Visit  Denise [name] is here for blood pressure check following {RLDELIVERYTYPE:31632} on ***. BP today is ***. Patient denies/endorses any {Symptoms; hypertension cc:11129:s:dizzness,blurred vision,headache,shortness of breath,peripheral edema}.  Vernell FORBES Ruddle, RN 11/02/2024  5:11 PM   132/80  83   150  No bleeding or pain  Will pick up meds tonight

## 2024-11-03 LAB — COMPREHENSIVE METABOLIC PANEL WITH GFR
ALT: 12 IU/L (ref 0–32)
AST: 13 IU/L (ref 0–40)
Albumin: 3.7 g/dL — ABNORMAL LOW (ref 3.9–4.9)
Alkaline Phosphatase: 89 IU/L (ref 41–116)
BUN/Creatinine Ratio: 7 — ABNORMAL LOW (ref 9–23)
BUN: 6 mg/dL (ref 6–20)
Bilirubin Total: <0.2 mg/dL (ref 0.0–1.2)
CO2: 21 mmol/L (ref 20–29)
Calcium: 9.3 mg/dL (ref 8.7–10.2)
Chloride: 104 mmol/L (ref 96–106)
Creatinine, Ser: 0.81 mg/dL (ref 0.57–1.00)
Globulin, Total: 2.7 g/dL (ref 1.5–4.5)
Glucose: 128 mg/dL — ABNORMAL HIGH (ref 70–99)
Potassium: 4.3 mmol/L (ref 3.5–5.2)
Sodium: 138 mmol/L (ref 134–144)
Total Protein: 6.4 g/dL (ref 6.0–8.5)
eGFR: 99 mL/min/1.73 (ref >59)

## 2024-11-03 LAB — CBC WITH DIFFERENTIAL/PLATELET
Basophils Absolute: 0 x10E3/uL (ref 0.0–0.2)
Basos: 0 %
EOS (ABSOLUTE): 0.3 x10E3/uL (ref 0.0–0.4)
Eos: 5 %
Hematocrit: 36.2 % (ref 34.0–46.6)
Hemoglobin: 12 g/dL (ref 11.1–15.9)
Immature Grans (Abs): 0 x10E3/uL (ref 0.0–0.1)
Immature Granulocytes: 0 %
Lymphocytes Absolute: 1.3 x10E3/uL (ref 0.7–3.1)
Lymphs: 23 %
MCH: 26 pg — ABNORMAL LOW (ref 26.6–33.0)
MCHC: 33.1 g/dL (ref 31.5–35.7)
MCV: 78 fL — ABNORMAL LOW (ref 79–97)
Monocytes Absolute: 0.5 x10E3/uL (ref 0.1–0.9)
Monocytes: 8 %
Neutrophils Absolute: 3.6 x10E3/uL (ref 1.4–7.0)
Neutrophils: 63 %
Platelets: 297 x10E3/uL (ref 150–450)
RBC: 4.62 x10E6/uL (ref 3.77–5.28)
RDW: 14.4 % (ref 11.7–15.4)
WBC: 5.7 x10E3/uL (ref 3.4–10.8)

## 2024-11-03 LAB — PROTEIN / CREATININE RATIO, URINE
Creatinine, Urine: 178.9 mg/dL
Protein, Ur: 56.2 mg/dL
Protein/Creat Ratio: 314 mg/g{creat} — ABNORMAL HIGH (ref 0–200)

## 2024-11-04 ENCOUNTER — Encounter: Payer: Self-pay | Admitting: Obstetrics and Gynecology

## 2024-11-04 MED ORDER — DEXCOM G7 SENSOR MISC: 1.0000 | Freq: Every day | 3 refills | Status: AC

## 2024-11-05 ENCOUNTER — Ambulatory Visit: Payer: Self-pay | Admitting: Obstetrics and Gynecology

## 2024-11-09 ENCOUNTER — Encounter (HOSPITAL_COMMUNITY): Payer: Self-pay | Admitting: Obstetrics & Gynecology

## 2024-11-09 ENCOUNTER — Inpatient Hospital Stay (HOSPITAL_COMMUNITY)
Admission: AD | Admit: 2024-11-09 | Discharge: 2024-11-09 | Disposition: A | Discharge status: Home / Self Care | Attending: Obstetrics & Gynecology | Admitting: Obstetrics & Gynecology

## 2024-11-09 ENCOUNTER — Telehealth: Payer: Self-pay | Admitting: *Deleted

## 2024-11-09 ENCOUNTER — Other Ambulatory Visit: Payer: Self-pay

## 2024-11-09 DIAGNOSIS — O24112 Pre-existing diabetes mellitus, type 2, in pregnancy, second trimester: Secondary | ICD-10-CM | POA: Insufficient documentation

## 2024-11-09 DIAGNOSIS — O10912 Unspecified pre-existing hypertension complicating pregnancy, second trimester: Secondary | ICD-10-CM | POA: Diagnosis not present

## 2024-11-09 DIAGNOSIS — Z794 Long term (current) use of insulin: Secondary | ICD-10-CM | POA: Diagnosis not present

## 2024-11-09 DIAGNOSIS — O26892 Other specified pregnancy related conditions, second trimester: Secondary | ICD-10-CM

## 2024-11-09 DIAGNOSIS — O23592 Infection of other part of genital tract in pregnancy, second trimester: Secondary | ICD-10-CM | POA: Diagnosis not present

## 2024-11-09 DIAGNOSIS — Z9641 Presence of insulin pump (external) (internal): Secondary | ICD-10-CM | POA: Insufficient documentation

## 2024-11-09 DIAGNOSIS — B379 Candidiasis, unspecified: Secondary | ICD-10-CM

## 2024-11-09 DIAGNOSIS — R102 Pelvic and perineal pain unspecified side: Secondary | ICD-10-CM | POA: Insufficient documentation

## 2024-11-09 DIAGNOSIS — O3432 Maternal care for cervical incompetence, second trimester: Secondary | ICD-10-CM | POA: Insufficient documentation

## 2024-11-09 DIAGNOSIS — R109 Unspecified abdominal pain: Secondary | ICD-10-CM

## 2024-11-09 DIAGNOSIS — B3731 Acute candidiasis of vulva and vagina: Secondary | ICD-10-CM | POA: Diagnosis not present

## 2024-11-09 DIAGNOSIS — Z79899 Other long term (current) drug therapy: Secondary | ICD-10-CM | POA: Insufficient documentation

## 2024-11-09 DIAGNOSIS — Z3A24 24 weeks gestation of pregnancy: Secondary | ICD-10-CM | POA: Diagnosis not present

## 2024-11-09 LAB — WET PREP, GENITAL
Clue Cells Wet Prep HPF POC: NONE SEEN
Sperm: NONE SEEN
Trich, Wet Prep: NONE SEEN
WBC, Wet Prep HPF POC: <10
Yeast Wet Prep HPF POC: NONE SEEN

## 2024-11-09 LAB — FETAL FIBRONECTIN: Fetal Fibronectin: NEGATIVE

## 2024-11-09 MED ORDER — LACTATED RINGERS IV BOLUS
1000.0000 mL | Freq: Once | INTRAVENOUS | Status: AC
  Administered 2024-11-09: 1000 mL via INTRAVENOUS

## 2024-11-09 MED ORDER — FLUCONAZOLE 150 MG PO TABS: 150.0000 mg | ORAL_TABLET | Freq: Every day | ORAL | 1 refills | Status: DC

## 2024-11-09 NOTE — Telephone Encounter (Signed)
 Left patient a detailed message about scheduled appointments.

## 2024-11-09 NOTE — MAU Note (Signed)
 Pt reports she was feeling tightening in her abdomen and vaginal pressure.

## 2024-11-09 NOTE — MAU Provider Note (Addendum)
 Chief Complaint:  Pelvic Pain and Contractions  HPI   Event Date/Time   First Provider Initiated Contact with Patient 11/09/24 1931     Denise [name] is a 33 y.o. H5E8887 at [redacted]w[redacted]d who presents to maternity admissions reporting intermittent uterine tightness and pelvic pressure, wants to make sure these aren't contractions since she has a cerclage. Denies vaginal bleeding, discharge or LOF. Feeling vigorous fetal movement. Has had about 16oz of water today. Sugars have been well controlled. No other physical complaints.   Pregnancy Course: Receives prenatal care at [facility], transferring to [facility] so her visits are all with the same provider (seeing Delon Emms for diabetes care). Has cHTN on procardia , DM2 moderately well controlled with an omnipod and dexcom, a cerclage placed at [redacted]w[redacted]d.  Past Medical History:  Diagnosis Date   Allergy    Arthritis    Asthma    Carpal tunnel syndrome of left wrist    Gestational diabetes    History of cervical cerclage 07/28/2013   Rescue cerclage in place     Previous pregnancy with short cervix, but term delivery  This pregnancy, serial cervical lengths:  12/2 cervix long and closed  12/23 cervix 1.4 cm, vaginal progesterone  started  1/6 cervical length 0.2 cm, rescue cerclage was placed     History of pre-eclampsia in prior pregnancy, currently pregnant    Hx of chlamydia infection 2017   Pregnancy induced hypertension    Pre-E   Seasonal allergies    Trichomonas 2017   OB History  Gravida Para Term Preterm AB Living  4 2 1 1 1 2   SAB IAB Ectopic Multiple Live Births  1    2    # Outcome Date GA Lbr Len/2nd Weight Sex Type Anes PTL Lv  4 Current           3 SAB 05/13/22 [redacted]w[redacted]d            Birth Comments: D&C for missed abortion  2 Preterm 01/01/19 [redacted]w[redacted]d 07:31 / 00:04 1 lb 9.8 oz (0.73 kg) M Vag-Spont None  LIV  1 Term 10/28/13 [redacted]w[redacted]d 05:35 / 00:19 6 lb 5.6 oz (2.88 kg) F Vag-Spont None  LIV     Birth Comments: Newborn Screen: Normal, Hgb, FA  NBS  Barcode: [id number]   Past Surgical History:  Procedure Laterality Date   CERVICAL CERCLAGE N/A 11/23/2018   Procedure: CERCLAGE CERVICAL;  Surgeon: Fredirick Glenys RAMAN, MD;  Location: [facility] BIRTHING SUITES;  Service: Gynecology;  Laterality: N/A;   CERVICAL CERCLAGE N/A 08/30/2024   Procedure: CERCLAGE, CERVIX, VAGINAL APPROACH;  Surgeon: Alger Gong, MD;  Location: [facility] LD [facility]S;  Service: Obstetrics;  Laterality: N/A;   DILATION AND CURETTAGE OF UTERUS  05/13/2022   Family History  Problem Relation Age of Onset   Arthritis Mother    Diabetes Mother    Diabetes Maternal Grandmother    Arthritis Maternal Grandmother    Social History[1] Allergies[2] Medications Prior to Admission  Medication Sig Dispense Refill Last Dose/Taking   albuterol  (VENTOLIN  HFA) 108 (90 Base) MCG/ACT inhaler Inhale 1-2 puffs into the lungs every 6 (six) hours as needed for wheezing or shortness of breath. 1 each 1 Past Month   aspirin  EC 81 MG tablet Take 2 tablets (162 mg total) by mouth daily. Swallow whole. 60 tablet 12 11/08/2024   escitalopram (LEXAPRO) 20 MG tablet Take 20 mg by mouth daily.   11/08/2024   NIFEdipine  (PROCARDIA  XL) 30 MG 24 hr tablet Take 1 tablet (30  mg total) by mouth daily. 60 tablet 3 11/08/2024   Blood Glucose Monitoring Suppl (ACCU-CHEK GUIDE) w/Device KIT Use as advised 1 kit 0    Budesonide  90 MCG/ACT inhaler Inhale 1-2 puffs into the lungs 2 (two) times daily. 1 each 2    Continuous Glucose Sensor (DEXCOM G7 SENSOR) MISC 1 each by Does not apply route daily for 10 days. 3 each 3    cyclobenzaprine  (FLEXERIL ) 10 MG tablet Take 1 tablet (10 mg total) by mouth 2 (two) times daily as needed. (Patient not taking: Reported on 10/27/2024) 20 tablet 2    cyclobenzaprine  (FLEXERIL ) 10 MG tablet Take 1 tablet (10 mg total) by mouth 3 (three) times daily as needed for muscle spasms. 30 tablet 2    glucose blood (ACCU-CHEK GUIDE) test strip Use as instructed 3-4x a day 300 each 3    Insulin   Disposable Pump (OMNIPOD 5 DEXG7G6 PODS GEN 5) MISC Use 1 POD every 2 days 15 each 11    insulin  lispro (HUMALOG ) 100 UNIT/ML injection Max dose 200 units per day. Used for omnipod insulin  pump, please give 30 day supply 10 mL 11    Insulin  Pen Needle 32G X 4 MM MISC Use 4x a day 300 each 3    metoCLOPramide  (REGLAN ) 10 MG tablet Take 1 tablet (10 mg total) by mouth 4 (four) times daily -  before meals and at bedtime. 60 tablet 1    prochlorperazine  (COMPAZINE ) 10 MG tablet Take 1 tablet (10 mg total) by mouth every 6 (six) hours as needed for nausea or vomiting. 120 tablet 0    progesterone  (PROMETRIUM ) 200 MG capsule 400 mg See admin instructions. Taking 200mg  orally at bedtime AND inserting a 200mg  capsule vaginally at bedtime.      promethazine  (PHENERGAN ) 25 MG tablet Take 1 tablet (25 mg total) by mouth every 6 (six) hours as needed. May insert vaginally, if unable to keep anything down by mouth (Patient not taking: Reported on 10/27/2024) 120 tablet 0    scopolamine  (TRANSDERM-SCOP) 1 MG/3DAYS Place 1 patch (1 mg total) onto the skin every 3 (three) days. 10 patch 0    I have reviewed patient's Past Medical Hx, Surgical Hx, Family Hx, Social Hx, medications and allergies.   ROS  Pertinent items noted in HPI and remainder of comprehensive ROS otherwise negative.   PHYSICAL EXAM  Patient Vitals for the past 24 hrs:  SpO2  11/09/24 1920 100 %   Constitutional: Well-developed, well-nourished female in no acute distress.  Cardiovascular: normal rate & rhythm, warm and well-perfused Respiratory: normal effort, no problems with respiration noted GI: Abd soft, non-tender, non-distended MS: Extremities nontender, no edema, normal ROM Neurologic: Alert and oriented x 4.  GU: no CVA tenderness Pelvic: normal external female genitalia,copious thick white discharge, no blood, cervix thick, closed, no tension on cerclage.  Fetal Tracing: reactive Baseline: 125 Variability:  moderate Accelerations: 15x15 Decelerations: none Toco: relaxed, mild UI   Labs: Results for orders placed or performed during the hospital encounter of 11/09/24 (from the past 24 hours)  Wet prep, genital     Status: None   Collection Time: 11/09/24  7:30 PM   Specimen: Vaginal  Result Value Ref Range   Yeast Wet Prep HPF POC NONE SEEN NONE SEEN   Trich, Wet Prep NONE SEEN NONE SEEN   Clue Cells Wet Prep HPF POC NONE SEEN NONE SEEN   WBC, Wet Prep HPF POC <10 <10   Sperm NONE SEEN  Imaging:  No results found.  MDM & MAU COURSE  MDM:  MAU Course: Orders Placed This Encounter  Procedures   Wet prep, genital   Fetal fibronectin   Meds ordered this encounter  Medications   lactated ringers  bolus 1,000 mL   Swabs  collected, will treat for yeast due to amount and appearance of discharge. Fluids given to calm uterine irritability.  Care turned over to Claris Cedar, CNM on 11/09/2024 at 0827 Cornell Finder, CNM, MSN, IBCLC Certified Nurse Midwife, [facility]    ASSESSMENT  Yeast infection  Abdominal pain, unspecified abdominal location  [redacted] weeks gestation of pregnancy  PLAN   1. Yeast infection   2. Abdominal pain, unspecified abdominal location   3. [redacted] weeks gestation of pregnancy     - Reviewed that this is likely uterine irritability caused by vaginal yeast infection.  - Rx for single dose Diflucan  sent to outpatient pharmacy for pick up.  - Patient has Appt on 12/29. Encouraged to keep appt.  - Reviewed worsening signs and return precautions  - FHT appropriate for gestational age at time of discharge  - Patient discharged home in stable condition and may return to MAU as needed.   Axten Pascucci Erven) Cedar, MSN, CNM  Center for [facility]  11/09/2024 9:03 PM        [1]  Social History Tobacco Use   Smoking status: Never   Smokeless tobacco: Never  Vaping Use   Vaping status: Never Used  Substance Use Topics   Alcohol use:  Not Currently   Drug use: No  [2]  Allergies Allergen Reactions   Farxiga  [Dapagliflozin ] Nausea And Vomiting

## 2024-11-15 ENCOUNTER — Ambulatory Visit

## 2024-11-15 ENCOUNTER — Ambulatory Visit: Admitting: Maternal & Fetal Medicine

## 2024-11-15 VITALS — BP 119/87 | HR 105

## 2024-11-15 DIAGNOSIS — O26879 Cervical shortening, unspecified trimester: Secondary | ICD-10-CM

## 2024-11-15 DIAGNOSIS — O24112 Pre-existing diabetes mellitus, type 2, in pregnancy, second trimester: Secondary | ICD-10-CM

## 2024-11-15 DIAGNOSIS — O24113 Pre-existing diabetes mellitus, type 2, in pregnancy, third trimester: Secondary | ICD-10-CM

## 2024-11-15 DIAGNOSIS — Z3A24 24 weeks gestation of pregnancy: Secondary | ICD-10-CM

## 2024-11-15 DIAGNOSIS — Z8751 Personal history of pre-term labor: Secondary | ICD-10-CM | POA: Insufficient documentation

## 2024-11-15 DIAGNOSIS — O3112X0 Continuing pregnancy after spontaneous abortion of one fetus or more, second trimester, not applicable or unspecified: Secondary | ICD-10-CM | POA: Insufficient documentation

## 2024-11-15 DIAGNOSIS — Z8759 Personal history of other complications of pregnancy, childbirth and the puerperium: Secondary | ICD-10-CM

## 2024-11-15 DIAGNOSIS — O99212 Obesity complicating pregnancy, second trimester: Secondary | ICD-10-CM | POA: Insufficient documentation

## 2024-11-15 DIAGNOSIS — O09292 Supervision of pregnancy with other poor reproductive or obstetric history, second trimester: Secondary | ICD-10-CM | POA: Insufficient documentation

## 2024-11-15 DIAGNOSIS — O3432 Maternal care for cervical incompetence, second trimester: Secondary | ICD-10-CM

## 2024-11-15 DIAGNOSIS — O10012 Pre-existing essential hypertension complicating pregnancy, second trimester: Secondary | ICD-10-CM | POA: Diagnosis not present

## 2024-11-15 DIAGNOSIS — O3110X0 Continuing pregnancy after spontaneous abortion of one fetus or more, unspecified trimester, not applicable or unspecified: Secondary | ICD-10-CM | POA: Diagnosis not present

## 2024-11-15 DIAGNOSIS — I10 Essential (primary) hypertension: Secondary | ICD-10-CM

## 2024-11-15 DIAGNOSIS — E119 Type 2 diabetes mellitus without complications: Secondary | ICD-10-CM

## 2024-11-15 DIAGNOSIS — O402XX2 Polyhydramnios, second trimester, fetus 2: Secondary | ICD-10-CM | POA: Diagnosis not present

## 2024-11-15 DIAGNOSIS — O0990 Supervision of high risk pregnancy, unspecified, unspecified trimester: Secondary | ICD-10-CM

## 2024-11-15 DIAGNOSIS — O285 Abnormal chromosomal and genetic finding on antenatal screening of mother: Secondary | ICD-10-CM | POA: Diagnosis not present

## 2024-11-15 DIAGNOSIS — O26872 Cervical shortening, second trimester: Secondary | ICD-10-CM | POA: Insufficient documentation
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
## 2024-11-15 NOTE — Progress Notes (Signed)
 Pt came by [facility] for Dexcom sample per Rasch, NP due to insurance not refilling until 11/24/23.  Dexcom G7 sample provided.    [place], RN

## 2024-11-16 ENCOUNTER — Telehealth: Admitting: Obstetrics and Gynecology

## 2024-11-16 DIAGNOSIS — O0990 Supervision of high risk pregnancy, unspecified, unspecified trimester: Secondary | ICD-10-CM

## 2024-11-16 DIAGNOSIS — O0992 Supervision of high risk pregnancy, unspecified, second trimester: Secondary | ICD-10-CM | POA: Diagnosis not present

## 2024-11-16 DIAGNOSIS — Z3A25 25 weeks gestation of pregnancy: Secondary | ICD-10-CM | POA: Diagnosis not present

## 2024-11-16 NOTE — Progress Notes (Signed)
 "   TELEHEALTH OBSTETRICS VISIT ENCOUNTER NOTE  FOR DIABETES MANAGEMENT DURING PREGNANCY    Provider location: Center for [facility]   Patient location: Home  I connected with Denise [name] Her on 11/16/2024 at  4:10 PM EST by telephone at home and verified that I am speaking with the correct person using two identifiers. Of note, unable to do video encounter due to technical difficulties.    I discussed the limitations, risks, security and privacy concerns of performing an evaluation and management service by telephone and the availability of in person appointments. I also discussed with the patient that there may be a patient responsible charge related to this service. The patient expressed understanding and agreed to proceed.   History:   Denise [name] is a 33 y.o. [id number] at [redacted]w[redacted]d by LMP being seen today for diabetes management during pregnancy.   The following portions of the patient's history were reviewed and updated as appropriate: allergies, current medications, past family history, past medical history, past social history, past surgical history and problem list.   Past Medical History:  Diagnosis Date   Allergy    Arthritis    Asthma    Carpal tunnel syndrome of left wrist    Gestational diabetes    History of cervical cerclage 07/28/2013   Rescue cerclage in place     Previous pregnancy with short cervix, but term delivery  This pregnancy, serial cervical lengths:  12/2 cervix long and closed  12/23 cervix 1.4 cm, vaginal progesterone  started  1/6 cervical length 0.2 cm, rescue cerclage was placed     History of pre-eclampsia in prior pregnancy, currently pregnant    Hx of chlamydia infection 2017   Pregnancy induced hypertension    Pre-E   Seasonal allergies    Trichomonas 2017   Past Surgical History:  Procedure Laterality Date   CERVICAL CERCLAGE N/A 11/23/2018   Procedure: CERCLAGE CERVICAL;  Surgeon: Fredirick Glenys RAMAN, MD;  Location: [facility]  BIRTHING SUITES;  Service: Gynecology;  Laterality: N/A;   CERVICAL CERCLAGE N/A 08/30/2024   Procedure: CERCLAGE, CERVIX, VAGINAL APPROACH;  Surgeon: Alger Gong, MD;  Location: [facility] LD ORS;  Service: Obstetrics;  Laterality: N/A;   DILATION AND CURETTAGE OF UTERUS  05/13/2022   Family History  Problem Relation Age of Onset   Arthritis Mother    Diabetes Mother    Diabetes Maternal Grandmother    Arthritis Maternal Grandmother    Social History[1] Allergies[2] Medications Ordered Prior to Encounter[3]   Objective:   General:  Alert, oriented and cooperative.   Mental Status: Normal mood and affect perceived. Normal judgment and thought content.   The Rest of physical exam deferred due to type of encounter   Type 2 DM,  Diagnosed    Follow up US  completed with MFM on 11/15/2024 with EFW 29%tile, AC 40%tile  Fetal Echo normal, has follow up fetal echo on 11/23/2023  A1C now 8.2%, down from 14%    Blood sugar monitoring: Dexcom in place with data sharing set up.   With CGM, target Blood sugar set at 65-140.   Currently glucose in target range is 28% in target range   Using 51% basal and Bolus 49% improved.   Has Back up glucose monitor     Fasting BS 150's   2 hour PP all > 120   Overall baseline elevated.      Current Diabetes medications:  Omnipod insulin  pump in place> Manual mode.    1.70  units per hour 3 am- 9 am> increase to 1.80 units per hour.  1.90 units per hour. 9am-9pm> increase to 2.05 units per hour.  2.0 units per hour 9 pm- 12 am> Increase to 2.10 units per hour.  2.0 units per hour 12 am- 3 am> increase to 2.10 units per hour.    Correction factor is set to 18 Carb ratio is 1:5 Target BS is 110   Medication List  Procardia  30 mg XL daily (started by [facility] provider for cHTN)  Lexapro 20 mg daily  ASA 162 mg daily   Reports normal home BP readings less than 140/90 Some swelling, but no HA, no vision changes.  Planning to transfer care  to our office. Has IP visit next week for regular high risk OB visit.     Plan:   -Continue above changes.   -Recommend dosing 15- 20 minutes prior to eating.    -Avoid post meal coverage due hypoglycemia.    -Discussed only 30-40 grams of carbohydrates with each meal, with majority of your meals being protein and high fiber vegetables.      -Protein snack before bed. Recommend 10-20 grams of protein before bed.    -Anticipate weekly diabetes visits until BS's are better controlled -Anticipate frequent in person OB visits along with MFM care.  -q4 week growth US  starting at 20w with antenatal weekly testing starting at 32 weeks or sooner if needed.    -Continue BASA 162 mg      This visit is for the purposes of diabetes management only. Please keep scheduled OB visits with OB team for prenatal care.    Preterm labor symptoms and general obstetric precautions including but not limited to vaginal bleeding, contractions, leaking of fluid and fetal movement were reviewed in detail with the patient.  I discussed the assessment and treatment plan with the patient. The patient was provided an opportunity to ask questions and all were answered. The patient agreed with the plan and demonstrated an understanding of the instructions. The patient was advised to call back or seek an in-person office evaluation/go to MAU at [facility] for any urgent or concerning symptoms. Please refer to After Visit Summary for other counseling recommendations.    I provided 15 minutes of non-face-to-face time during this encounter.   Brett Darko, Delon FERNS, NP [facility] [employer], [facility]        [1]  Social History Tobacco Use   Smoking status: Never   Smokeless tobacco: Never  Vaping Use   Vaping status: Never Used  Substance Use Topics   Alcohol use: Not Currently   Drug use: No  [2]  Allergies Allergen Reactions   Farxiga   [Dapagliflozin ] Nausea And Vomiting  [3]  Current Outpatient Medications on File Prior to Visit  Medication Sig Dispense Refill   albuterol  (VENTOLIN  HFA) 108 (90 Base) MCG/ACT inhaler Inhale 1-2 puffs into the lungs every 6 (six) hours as needed for wheezing or shortness of breath. 1 each 1   aspirin  EC 81 MG tablet Take 2 tablets (162 mg total) by mouth daily. Swallow whole. 60 tablet 12   Blood Glucose Monitoring Suppl (ACCU-CHEK GUIDE) w/Device KIT Use as advised 1 kit 0   Budesonide  90 MCG/ACT inhaler Inhale 1-2 puffs into the lungs 2 (two) times daily. 1 each 2   cyclobenzaprine  (FLEXERIL ) 10 MG tablet Take 1 tablet (10 mg total) by mouth 2 (two) times daily as needed. (Patient not taking:  Reported on 10/27/2024) 20 tablet 2   cyclobenzaprine  (FLEXERIL ) 10 MG tablet Take 1 tablet (10 mg total) by mouth 3 (three) times daily as needed for muscle spasms. 30 tablet 2   escitalopram (LEXAPRO) 20 MG tablet Take 20 mg by mouth daily.     fluconazole  (DIFLUCAN ) 150 MG tablet Take 1 tablet (150 mg total) by mouth daily. 1 tablet 1   glucose blood (ACCU-CHEK GUIDE) test strip Use as instructed 3-4x a day 300 each 3   Insulin  Disposable Pump (OMNIPOD 5 [id number] PODS GEN 5) MISC Use 1 POD every 2 days 15 each 11   insulin  lispro (HUMALOG ) 100 UNIT/ML injection Max dose 200 units per day. Used for omnipod insulin  pump, please give 30 day supply 10 mL 11   Insulin  Pen Needle 32G X 4 MM MISC Use 4x a day 300 each 3   metoCLOPramide  (REGLAN ) 10 MG tablet Take 1 tablet (10 mg total) by mouth 4 (four) times daily -  before meals and at bedtime. 60 tablet 1   NIFEdipine  (PROCARDIA  XL) 30 MG 24 hr tablet Take 1 tablet (30 mg total) by mouth daily. 60 tablet 3   prochlorperazine  (COMPAZINE ) 10 MG tablet Take 1 tablet (10 mg total) by mouth every 6 (six) hours as needed for nausea or vomiting. 120 tablet 0   progesterone  (PROMETRIUM ) 200 MG capsule 400 mg See admin instructions. Taking 200mg  orally at bedtime AND  inserting a 200mg  capsule vaginally at bedtime. (Patient not taking: Reported on 11/15/2024)     promethazine  (PHENERGAN ) 25 MG tablet Take 1 tablet (25 mg total) by mouth every 6 (six) hours as needed. May insert vaginally, if unable to keep anything down by mouth (Patient not taking: Reported on 10/27/2024) 120 tablet 0   scopolamine  (TRANSDERM-SCOP) 1 MG/3DAYS Place 1 patch (1 mg total) onto the skin every 3 (three) days. 10 patch 0   No current facility-administered medications on file prior to visit.   "

## 2024-11-17 ENCOUNTER — Encounter: Admitting: Certified Nurse Midwife

## 2024-11-23 ENCOUNTER — Ambulatory Visit (INDEPENDENT_AMBULATORY_CARE_PROVIDER_SITE_OTHER): Admitting: Obstetrics and Gynecology

## 2024-11-23 VITALS — BP 127/82 | HR 91 | Wt 253.0 lb

## 2024-11-23 DIAGNOSIS — O0990 Supervision of high risk pregnancy, unspecified, unspecified trimester: Secondary | ICD-10-CM

## 2024-11-23 DIAGNOSIS — F32A Depression, unspecified: Secondary | ICD-10-CM

## 2024-11-23 DIAGNOSIS — Z8751 Personal history of pre-term labor: Secondary | ICD-10-CM

## 2024-11-23 DIAGNOSIS — I10 Essential (primary) hypertension: Secondary | ICD-10-CM

## 2024-11-23 DIAGNOSIS — O24112 Pre-existing diabetes mellitus, type 2, in pregnancy, second trimester: Secondary | ICD-10-CM

## 2024-11-23 DIAGNOSIS — O10012 Pre-existing essential hypertension complicating pregnancy, second trimester: Secondary | ICD-10-CM

## 2024-11-23 DIAGNOSIS — O99342 Other mental disorders complicating pregnancy, second trimester: Secondary | ICD-10-CM

## 2024-11-23 DIAGNOSIS — Z8759 Personal history of other complications of pregnancy, childbirth and the puerperium: Secondary | ICD-10-CM

## 2024-11-23 DIAGNOSIS — Z3A26 26 weeks gestation of pregnancy: Secondary | ICD-10-CM
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
## 2024-11-26 ENCOUNTER — Inpatient Hospital Stay (HOSPITAL_COMMUNITY)
Admission: AD | Admit: 2024-11-26 | Discharge: 2024-11-27 | Disposition: A | Attending: Obstetrics and Gynecology | Admitting: Obstetrics and Gynecology

## 2024-11-26 ENCOUNTER — Inpatient Hospital Stay (HOSPITAL_COMMUNITY)

## 2024-11-26 DIAGNOSIS — J45909 Unspecified asthma, uncomplicated: Secondary | ICD-10-CM | POA: Insufficient documentation

## 2024-11-26 DIAGNOSIS — O99512 Diseases of the respiratory system complicating pregnancy, second trimester: Secondary | ICD-10-CM | POA: Diagnosis not present

## 2024-11-26 DIAGNOSIS — O10012 Pre-existing essential hypertension complicating pregnancy, second trimester: Secondary | ICD-10-CM | POA: Diagnosis not present

## 2024-11-26 DIAGNOSIS — O368120 Decreased fetal movements, second trimester, not applicable or unspecified: Secondary | ICD-10-CM | POA: Insufficient documentation

## 2024-11-26 DIAGNOSIS — Z794 Long term (current) use of insulin: Secondary | ICD-10-CM | POA: Diagnosis not present

## 2024-11-26 DIAGNOSIS — O26892 Other specified pregnancy related conditions, second trimester: Secondary | ICD-10-CM | POA: Diagnosis not present

## 2024-11-26 DIAGNOSIS — R079 Chest pain, unspecified: Secondary | ICD-10-CM | POA: Diagnosis present

## 2024-11-26 DIAGNOSIS — Z3A26 26 weeks gestation of pregnancy: Secondary | ICD-10-CM | POA: Diagnosis not present

## 2024-11-26 DIAGNOSIS — O24112 Pre-existing diabetes mellitus, type 2, in pregnancy, second trimester: Secondary | ICD-10-CM | POA: Diagnosis not present

## 2024-11-26 DIAGNOSIS — R0602 Shortness of breath: Secondary | ICD-10-CM | POA: Diagnosis present

## 2024-11-26 DIAGNOSIS — J069 Acute upper respiratory infection, unspecified: Secondary | ICD-10-CM | POA: Insufficient documentation

## 2024-11-26 DIAGNOSIS — J209 Acute bronchitis, unspecified: Secondary | ICD-10-CM | POA: Diagnosis not present

## 2024-11-26 DIAGNOSIS — B9689 Other specified bacterial agents as the cause of diseases classified elsewhere: Secondary | ICD-10-CM

## 2024-11-26 DIAGNOSIS — R059 Cough, unspecified: Secondary | ICD-10-CM | POA: Diagnosis present

## 2024-11-26 LAB — RESP PANEL BY RT-PCR (RSV, FLU A&B, COVID)  RVPGX2
Influenza A by PCR: NEGATIVE
Influenza B by PCR: NEGATIVE
Resp Syncytial Virus by PCR: NEGATIVE
SARS Coronavirus 2 by RT PCR: NEGATIVE

## 2024-11-26 MED ORDER — IPRATROPIUM-ALBUTEROL 0.5-2.5 (3) MG/3ML IN SOLN
3.0000 mL | Freq: Once | RESPIRATORY_TRACT | Status: AC
  Administered 2024-11-26: 3 mL via RESPIRATORY_TRACT
  Filled 2024-11-26: qty 3

## 2024-11-26 NOTE — MAU Note (Signed)
 Denise [name] is a 35 y.o. at [redacted]w[redacted]d here in MAU reporting: having SOB for days coughing x 1 week  having some blood in her mucus and chest has a burning feeling and she is wheezing. Body aches.  Reports decreased fetal movement denies any abd pain or cramping. No vag bleeding or leaking reported.   LMP:  Onset of complaint:  1 week  Pain score: 7  Vitals:   11/26/24 2125 11/26/24 2132  BP: 130/75   Pulse: (!) 101   Resp: 14   Temp:  98 F (36.7 C)     FHT: 147   Lab orders placed from triage:

## 2024-11-26 NOTE — MAU Provider Note (Signed)
 Chief Complaint:  No chief complaint on file.   HPI   None     Denise [name] is a 34 y.o. [phone] at [redacted]w[redacted]d who presents to maternity admissions reporting SOB, cough, wheezing, chest burning, body aches.  She reports shortness of breath and coughing has been ongoing for about a week.  She endorses wheezing, body aches, and burning in her chest today as well. Endorses decreased fetal movement. Denies vaginal bleeding, leaking of fluid, abdominal pain.  Pregnancy Course: Receives care at [facility] for [facility] . Prenatal records reviewed. Pregnancy complicated by cHTN, asthma, T2DM.  Past Medical History:  Diagnosis Date   Allergy    Arthritis    Asthma    Carpal tunnel syndrome of left wrist    Gestational diabetes    History of cervical cerclage 07/28/2013   Rescue cerclage in place     Previous pregnancy with short cervix, but term delivery  This pregnancy, serial cervical lengths:  12/2 cervix long and closed  12/23 cervix 1.4 cm, vaginal progesterone  started  1/6 cervical length 0.2 cm, rescue cerclage was placed     History of pre-eclampsia in prior pregnancy, currently pregnant    Hx of chlamydia infection 2017   Pregnancy induced hypertension    Pre-E   Seasonal allergies    Trichomonas 2017   OB History  Gravida Para Term Preterm AB Living  4 2 1 1 1 2   SAB IAB Ectopic Multiple Live Births  1    2    # Outcome Date GA Lbr Len/2nd Weight Sex Type Anes PTL Lv  4 Current           3 SAB 05/13/22 [redacted]w[redacted]d            Birth Comments: D&C for missed abortion  2 Preterm 01/01/19 [redacted]w[redacted]d 07:31 / 00:04 730 g M Vag-Spont None  LIV  1 Term 10/28/13 [redacted]w[redacted]d 05:35 / 00:19 2880 g F Vag-Spont None  LIV     Birth Comments: Newborn Screen: Normal, Hgb, FA  NBS Barcode: [id number]   Past Surgical History:  Procedure Laterality Date   CERVICAL CERCLAGE N/A 11/23/2018   Procedure: CERCLAGE CERVICAL;  Surgeon: Fredirick Glenys RAMAN, MD;  Location: [facility] BIRTHING SUITES;  Service:  Gynecology;  Laterality: N/A;   CERVICAL CERCLAGE N/A 08/30/2024   Procedure: CERCLAGE, CERVIX, VAGINAL APPROACH;  Surgeon: Alger Gong, MD;  Location: [facility] LD ORS;  Service: Obstetrics;  Laterality: N/A;   DILATION AND CURETTAGE OF UTERUS  05/13/2022   Family History  Problem Relation Age of Onset   Arthritis Mother    Diabetes Mother    Diabetes Maternal Grandmother    Arthritis Maternal Grandmother    Social History[1] Allergies[2] Medications Prior to Admission  Medication Sig Dispense Refill Last Dose/Taking   aspirin  EC 81 MG tablet Take 2 tablets (162 mg total) by mouth daily. Swallow whole. 60 tablet 12 11/26/2024   escitalopram (LEXAPRO) 20 MG tablet Take 20 mg by mouth daily.   Past Week   NIFEdipine  (PROCARDIA  XL) 30 MG 24 hr tablet Take 1 tablet (30 mg total) by mouth daily. 60 tablet 3 11/26/2024   albuterol  (VENTOLIN  HFA) 108 (90 Base) MCG/ACT inhaler Inhale 1-2 puffs into the lungs every 6 (six) hours as needed for wheezing or shortness of breath. 1 each 1    Blood Glucose Monitoring Suppl (ACCU-CHEK GUIDE) w/Device KIT Use as advised 1 kit 0    Budesonide  90 MCG/ACT inhaler Inhale 1-2 puffs into the  lungs 2 (two) times daily. 1 each 2    cyclobenzaprine  (FLEXERIL ) 10 MG tablet Take 1 tablet (10 mg total) by mouth 2 (two) times daily as needed. (Patient not taking: Reported on 11/23/2024) 20 tablet 2    cyclobenzaprine  (FLEXERIL ) 10 MG tablet Take 1 tablet (10 mg total) by mouth 3 (three) times daily as needed for muscle spasms. (Patient not taking: Reported on 11/23/2024) 30 tablet 2    fluconazole  (DIFLUCAN ) 150 MG tablet Take 1 tablet (150 mg total) by mouth daily. (Patient not taking: Reported on 11/23/2024) 1 tablet 1    glucose blood (ACCU-CHEK GUIDE) test strip Use as instructed 3-4x a day 300 each 3    Insulin  Disposable Pump (OMNIPOD 5 DEXG7G6 PODS GEN 5) MISC Use 1 POD every 2 days 15 each 11    insulin  lispro (HUMALOG ) 100 UNIT/ML injection Max dose 200 units per day.  Used for omnipod insulin  pump, please give 30 day supply 10 mL 11    Insulin  Pen Needle 32G X 4 MM MISC Use 4x a day 300 each 3    metoCLOPramide  (REGLAN ) 10 MG tablet Take 1 tablet (10 mg total) by mouth 4 (four) times daily -  before meals and at bedtime. (Patient not taking: Reported on 11/23/2024) 60 tablet 1    prochlorperazine  (COMPAZINE ) 10 MG tablet Take 1 tablet (10 mg total) by mouth every 6 (six) hours as needed for nausea or vomiting. (Patient not taking: Reported on 11/23/2024) 120 tablet 0    progesterone  (PROMETRIUM ) 200 MG capsule 400 mg See admin instructions. Taking 200mg  orally at bedtime AND inserting a 200mg  capsule vaginally at bedtime. (Patient not taking: Reported on 11/23/2024)      promethazine  (PHENERGAN ) 25 MG tablet Take 1 tablet (25 mg total) by mouth every 6 (six) hours as needed. May insert vaginally, if unable to keep anything down by mouth (Patient not taking: Reported on 11/23/2024) 120 tablet 0    scopolamine  (TRANSDERM-SCOP) 1 MG/3DAYS Place 1 patch (1 mg total) onto the skin every 3 (three) days. (Patient not taking: Reported on 11/23/2024) 10 patch 0     I have reviewed patient's Past Medical Hx, Surgical Hx, Family Hx, Social Hx, medications and allergies.   ROS  Pertinent items noted in HPI and remainder of comprehensive ROS otherwise negative.   PHYSICAL EXAM  Patient Vitals for the past 24 hrs:  BP Temp Temp src Pulse Resp SpO2 Height Weight  11/26/24 2307 -- -- -- -- -- 100 % -- --  11/26/24 2132 -- 98 F (36.7 C) -- -- -- -- -- --  11/26/24 2125 130/75 -- Oral (!) 101 14 -- 5' 5.5 (1.664 m) 117.3 kg  FHT: 147 in triage  Constitutional: Well-developed, well-nourished female in no acute distress.  HEENT: atraumatic, normocephalic. Neck has normal ROM. EOM intact. Cardiovascular: normal rate & rhythm, warm and well-perfused Respiratory: normal effort, no problems with respiration noted GI: Abd soft, non-tender, non-distended MSK: Extremities nontender, no  edema, normal ROM Skin: warm and dry. Acyanotic, no jaundice or pallor. Neurologic: Alert and oriented x 4. No abnormal coordination. Psychiatric: Normal mood. Speech not slurred, not rapid/pressured. Patient is cooperative.  Fetal Tracing: Baseline FHR: 40 per minute Fetal heart variability: moderate Fetal Heart Rate accelerations: yes - 10x10 appropriate for gestational age Fetal Heart Rate decelerations: none Fetal Non-stress Test: Category I (reactive) Toco: no uterine contractions   Labs: Results for orders placed or performed during the hospital encounter of 11/26/24 (from the past  24 hours)  Resp panel by RT-PCR (RSV, Flu A&B, Covid) Anterior Nasal Swab     Status: None   Collection Time: 11/26/24  9:44 PM   Specimen: Anterior Nasal Swab  Result Value Ref Range   SARS Coronavirus 2 by RT PCR NEGATIVE NEGATIVE   Influenza A by PCR NEGATIVE NEGATIVE   Influenza B by PCR NEGATIVE NEGATIVE   Resp Syncytial Virus by PCR NEGATIVE NEGATIVE    Imaging:  DG Chest Portable 1 View Result Date: 11/26/2024 CLINICAL DATA:  Cough for 1 week, short of breath EXAM: PORTABLE CHEST 1 VIEW COMPARISON:  None Available. FINDINGS: Single frontal view of the chest demonstrates an unremarkable cardiac silhouette. No acute airspace disease, effusion, or pneumothorax. No acute bony abnormalities. S shaped thoracolumbar scoliosis. IMPRESSION: 1. No acute intrathoracic process. Electronically Signed   By: Ozell Daring M.D.   On: 11/26/2024 23:15    MDM & MAU COURSE  MDM: Moderate  MAU Course: -Vital signs within normal limits. Normotensive and afebrile. -Respiratory panel for influenza, COVID, and RSV.  -Patient reports concern for pneumonia, has been messaging with Delon Emms NP about possible need for antibiotics due to symptoms. -Given history of asthma and wheezing on exam, giving Duoneb in MAU. -CXR negative for lobar pneumonia. Given discussion with previous provider, reasonable to  prescribe antibiotics to cover for bacterial URI given length of symptoms at this point.  Differential diagnosis considered for cough includes but is not limited to: viral respiratory tract infection, heart failure, bacterial pneumonia, asthma, COPD, bronchiolitis, foreign body   Orders Placed This Encounter  Procedures   Resp panel by RT-PCR (RSV, Flu A&B, Covid) Anterior Nasal Swab   DG Chest Portable 1 View   Airborne and Contact precautions   Discharge patient   Meds ordered this encounter  Medications   ipratropium-albuterol  (DUONEB) 0.5-2.5 (3) MG/3ML nebulizer solution 3 mL   azithromycin  (ZITHROMAX ) 250 MG tablet    Sig: Take as directed: Two pills by mouth the first day, then one pill every day until completed    Dispense:  6 tablet    Refill:  1    ASSESSMENT   1. Acute bronchitis, unspecified organism   2. Bacterial URI   3. [redacted] weeks gestation of pregnancy     PLAN  Discharge home in stable condition with return precautions.  Azithromycin  for bacterial respiratory infection given length of symptoms and immunocompromised state of pregnant patient with T2DM and history of asthma.     Allergies as of 11/27/2024       Reactions   Farxiga  [dapagliflozin ] Nausea And Vomiting        Medication List     STOP taking these medications    fluconazole  150 MG tablet Commonly known as: Diflucan        TAKE these medications    Accu-Chek Guide test strip Generic drug: glucose blood Use as instructed 3-4x a day   Accu-Chek Guide w/Device Kit Use as advised   albuterol  108 (90 Base) MCG/ACT inhaler Commonly known as: VENTOLIN  HFA Inhale 1-2 puffs into the lungs every 6 (six) hours as needed for wheezing or shortness of breath.   aspirin  EC 81 MG tablet Take 2 tablets (162 mg total) by mouth daily. Swallow whole.   azithromycin  250 MG tablet Commonly known as: ZITHROMAX  Take as directed: Two pills by mouth the first day, then one pill every day until  completed   Budesonide  90 MCG/ACT inhaler Inhale 1-2 puffs into the lungs 2 (two)  times daily.   cyclobenzaprine  10 MG tablet Commonly known as: FLEXERIL  Take 1 tablet (10 mg total) by mouth 2 (two) times daily as needed.   cyclobenzaprine  10 MG tablet Commonly known as: FLEXERIL  Take 1 tablet (10 mg total) by mouth 3 (three) times daily as needed for muscle spasms.   escitalopram 20 MG tablet Commonly known as: LEXAPRO Take 20 mg by mouth daily.   insulin  lispro 100 UNIT/ML injection Commonly known as: HumaLOG  Max dose 200 units per day. Used for omnipod insulin  pump, please give 30 day supply   Insulin  Pen Needle 32G X 4 MM Misc Use 4x a day   metoCLOPramide  10 MG tablet Commonly known as: Reglan  Take 1 tablet (10 mg total) by mouth 4 (four) times daily -  before meals and at bedtime.   NIFEdipine  30 MG 24 hr tablet Commonly known as: Procardia  XL Take 1 tablet (30 mg total) by mouth daily.   Omnipod 5 DexG7G6 Pods Gen 5 Misc Use 1 POD every 2 days   prochlorperazine  10 MG tablet Commonly known as: COMPAZINE  Take 1 tablet (10 mg total) by mouth every 6 (six) hours as needed for nausea or vomiting.   progesterone  200 MG capsule Commonly known as: PROMETRIUM  400 mg See admin instructions. Taking 200mg  orally at bedtime AND inserting a 200mg  capsule vaginally at bedtime.   promethazine  25 MG tablet Commonly known as: PHENERGAN  Take 1 tablet (25 mg total) by mouth every 6 (six) hours as needed. May insert vaginally, if unable to keep anything down by mouth   scopolamine  1 MG/3DAYS Commonly known as: TRANSDERM-SCOP Place 1 patch (1 mg total) onto the skin every 3 (three) days.        Joesph DELENA Sear, PA      [1]  Social History Tobacco Use   Smoking status: Never   Smokeless tobacco: Never  Vaping Use   Vaping status: Never Used  Substance Use Topics   Alcohol use: Not Currently   Drug use: No  [2]  Allergies Allergen Reactions   Farxiga   [Dapagliflozin ] Nausea And Vomiting

## 2024-11-27 DIAGNOSIS — O26892 Other specified pregnancy related conditions, second trimester: Secondary | ICD-10-CM | POA: Diagnosis not present

## 2024-11-27 DIAGNOSIS — Z3A26 26 weeks gestation of pregnancy: Secondary | ICD-10-CM | POA: Diagnosis not present

## 2024-11-27 DIAGNOSIS — J209 Acute bronchitis, unspecified: Secondary | ICD-10-CM

## 2024-11-27 MED ORDER — AZITHROMYCIN 250 MG PO TABS: ORAL_TABLET | ORAL | 1 refills | Status: DC

## 2024-11-27 NOTE — Discharge Instructions (Addendum)
 Take the azithromycin  (antibiotics) to ensure we are treating any possible bacterial respiratory infection.  Upper Respiratory Illness:    Aches/Pains, Fever, Headache OTC Acetaminophen  (Tylenol ) 500 mg tablets - take max 2 tablets (1000 mg) every 6 hours (4 times per day)    Sinus Congestion Prescription fluticasone  (Flonase ) as directed Nasal Saline if desired to rinse pseudoephedrine  (Sudafed) -  use only after [redacted] weeks gestation and if you do not have high blood pressure  Oxymetolazone (Afrin, others) sparing use due to rebound congestion, NEVER use in kids Phenylephrine  (Sudafed) 10 mg tablets every 4 hours (or the 12-hour formulation)* Diphenhydramine  (Benadryl ) 25 mg tablets - take max 2 tablets every 4 hours, can make you drowsy/sleepy cetirizine  (Zyrtec ) - similar to Benadryl  but does not make you sleepy Vicks Vaporub - opens up nasal passages to make breathing easier Breath right strips   Cough & Sore Throat Alcohol-free cough drops Chloraseptic throat spray Cepacol throat lozenges Cold-Eeze - up to three times per day  Dextromethorphan (Robitussin, others) - cough suppressant Guaifenesin  (Robitussin, Mucinex , others) - expectorant (helps cough up mucus) Robitussin DM (plain only, alcohol-free) - includes cough suppressant! (Dextromethorphan and Guaifenesin  also come in a combination tablet/syrup) Lozenges w/ Benzocaine  + Menthol  (Cepacol) Honey - as much as you want! It has some antimicrobial properties and helps soothe the throat Teas which coat the throat - look for ingredients Elm Bark, Licorice Root, Marshmallow Root Any warm beverage or soup can be soothing for sore throat   Other Zinc Lozenges within 24 hours of symptoms onset - mixed evidence this shortens the duration of the common cold

## 2024-11-27 NOTE — MAU Provider Note (Incomplete)
 Chief Complaint:  No chief complaint on file.   HPI   None     Denise [name] is a 34 y.o. [phone] at [redacted]w[redacted]d who presents to maternity admissions reporting SOB, cough, wheezing, chest burning, body aches.  She reports shortness of breath and coughing has been ongoing for about a week.  She endorses wheezing, body aches, and burning in her chest today as well. Endorses decreased fetal movement. Denies vaginal bleeding, leaking of fluid, abdominal pain.  Pregnancy Course: Receives care at [facility] for [facility] . Prenatal records reviewed. Pregnancy complicated by cHTN, asthma, T2DM.  Past Medical History:  Diagnosis Date   Allergy    Arthritis    Asthma    Carpal tunnel syndrome of left wrist    Gestational diabetes    History of cervical cerclage 07/28/2013   Rescue cerclage in place     Previous pregnancy with short cervix, but term delivery  This pregnancy, serial cervical lengths:  12/2 cervix long and closed  12/23 cervix 1.4 cm, vaginal progesterone  started  1/6 cervical length 0.2 cm, rescue cerclage was placed     History of pre-eclampsia in prior pregnancy, currently pregnant    Hx of chlamydia infection 2017   Pregnancy induced hypertension    Pre-E   Seasonal allergies    Trichomonas 2017   OB History  Gravida Para Term Preterm AB Living  4 2 1 1 1 2   SAB IAB Ectopic Multiple Live Births  1    2    # Outcome Date GA Lbr Len/2nd Weight Sex Type Anes PTL Lv  4 Current           3 SAB 05/13/22 [redacted]w[redacted]d            Birth Comments: D&C for missed abortion  2 Preterm 01/01/19 [redacted]w[redacted]d 07:31 / 00:04 730 g M Vag-Spont None  LIV  1 Term 10/28/13 [redacted]w[redacted]d 05:35 / 00:19 2880 g F Vag-Spont None  LIV     Birth Comments: Newborn Screen: Normal, Hgb, FA  NBS Barcode: [id number]   Past Surgical History:  Procedure Laterality Date   CERVICAL CERCLAGE N/A 11/23/2018   Procedure: CERCLAGE CERVICAL;  Surgeon: Fredirick Glenys RAMAN, MD;  Location: [facility] BIRTHING SUITES;   Service: Gynecology;  Laterality: N/A;   CERVICAL CERCLAGE N/A 08/30/2024   Procedure: CERCLAGE, CERVIX, VAGINAL APPROACH;  Surgeon: Alger Gong, MD;  Location: [facility] LD ORS;  Service: Obstetrics;  Laterality: N/A;   DILATION AND CURETTAGE OF UTERUS  05/13/2022   Family History  Problem Relation Age of Onset   Arthritis Mother    Diabetes Mother    Diabetes Maternal Grandmother    Arthritis Maternal Grandmother    Social History[1] Allergies[2] Medications Prior to Admission  Medication Sig Dispense Refill Last Dose/Taking   aspirin  EC 81 MG tablet Take 2 tablets (162 mg total) by mouth daily. Swallow whole. 60 tablet 12 11/26/2024   escitalopram (LEXAPRO) 20 MG tablet Take 20 mg by mouth daily.   Past Week   NIFEdipine  (PROCARDIA  XL) 30 MG 24 hr tablet Take 1 tablet (30 mg total) by mouth daily. 60 tablet 3 11/26/2024   albuterol  (VENTOLIN  HFA) 108 (90 Base) MCG/ACT inhaler Inhale 1-2 puffs into the lungs every 6 (six) hours as needed for wheezing or shortness of breath. 1 each 1    Blood Glucose Monitoring Suppl (ACCU-CHEK GUIDE) w/Device KIT Use as advised 1 kit 0    Budesonide  90 MCG/ACT inhaler Inhale 1-2 puffs into the  lungs 2 (two) times daily. 1 each 2    cyclobenzaprine  (FLEXERIL ) 10 MG tablet Take 1 tablet (10 mg total) by mouth 2 (two) times daily as needed. (Patient not taking: Reported on 11/23/2024) 20 tablet 2    cyclobenzaprine  (FLEXERIL ) 10 MG tablet Take 1 tablet (10 mg total) by mouth 3 (three) times daily as needed for muscle spasms. (Patient not taking: Reported on 11/23/2024) 30 tablet 2    fluconazole  (DIFLUCAN ) 150 MG tablet Take 1 tablet (150 mg total) by mouth daily. (Patient not taking: Reported on 11/23/2024) 1 tablet 1    glucose blood (ACCU-CHEK GUIDE) test strip Use as instructed 3-4x a day 300 each 3    Insulin  Disposable Pump (OMNIPOD 5 DEXG7G6 PODS GEN 5) MISC Use 1 POD every 2 days 15 each 11    insulin  lispro (HUMALOG ) 100 UNIT/ML injection Max  dose 200 units per day. Used for omnipod insulin  pump, please give 30 day supply 10 mL 11    Insulin  Pen Needle 32G X 4 MM MISC Use 4x a day 300 each 3    metoCLOPramide  (REGLAN ) 10 MG tablet Take 1 tablet (10 mg total) by mouth 4 (four) times daily -  before meals and at bedtime. (Patient not taking: Reported on 11/23/2024) 60 tablet 1    prochlorperazine  (COMPAZINE ) 10 MG tablet Take 1 tablet (10 mg total) by mouth every 6 (six) hours as needed for nausea or vomiting. (Patient not taking: Reported on 11/23/2024) 120 tablet 0    progesterone  (PROMETRIUM ) 200 MG capsule 400 mg See admin instructions. Taking 200mg  orally at bedtime AND inserting a 200mg  capsule vaginally at bedtime. (Patient not taking: Reported on 11/23/2024)      promethazine  (PHENERGAN ) 25 MG tablet Take 1 tablet (25 mg total) by mouth every 6 (six) hours as needed. May insert vaginally, if unable to keep anything down by mouth (Patient not taking: Reported on 11/23/2024) 120 tablet 0    scopolamine  (TRANSDERM-SCOP) 1 MG/3DAYS Place 1 patch (1 mg total) onto the skin every 3 (three) days. (Patient not taking: Reported on 11/23/2024) 10 patch 0     I have reviewed patient's Past Medical Hx, Surgical Hx, Family Hx, Social Hx, medications and allergies.   ROS  Pertinent items noted in HPI and remainder of comprehensive ROS otherwise negative.   PHYSICAL EXAM  Patient Vitals for the past 24 hrs:  BP Temp Temp src Pulse Resp SpO2 Height Weight  11/26/24 2307 -- -- -- -- -- 100 % -- --  11/26/24 2132 -- 98 F (36.7 C) -- -- -- -- -- --  11/26/24 2125 130/75 -- Oral (!) 101 14 -- 5' 5.5 (1.664 m) 117.3 kg  FHT: 147 in triage  Constitutional: Well-developed, well-nourished female in no acute distress.  HEENT: atraumatic, normocephalic. Neck has normal ROM. EOM intact. Cardiovascular: normal rate & rhythm, warm and well-perfused Respiratory: normal effort, no problems with respiration noted GI: Abd soft, non-tender,  non-distended MSK: Extremities nontender, no edema, normal ROM Skin: warm and dry. Acyanotic, no jaundice or pallor. Neurologic: Alert and oriented x 4. No abnormal coordination. Psychiatric: Normal mood. Speech not slurred, not rapid/pressured. Patient is cooperative.  Fetal Tracing: Baseline FHR: *** per minute Fetal heart variability: {:312973} Fetal Heart Rate accelerations: {Responses; yes/no/wildcard:311178} Fetal Heart Rate decelerations: {:310437} Fetal Non-stress Test: {NST Categories:33063} Toco: no uterine contractions   Labs: Results for orders placed or performed during the hospital encounter of 11/26/24 (from the past 24 hours)  Resp panel by  RT-PCR (RSV, Flu A&B, Covid) Anterior Nasal Swab     Status: None   Collection Time: 11/26/24  9:44 PM   Specimen: Anterior Nasal Swab  Result Value Ref Range   SARS Coronavirus 2 by RT PCR NEGATIVE NEGATIVE   Influenza A by PCR NEGATIVE NEGATIVE   Influenza B by PCR NEGATIVE NEGATIVE   Resp Syncytial Virus by PCR NEGATIVE NEGATIVE    Imaging:  DG Chest Portable 1 View Result Date: 11/26/2024 CLINICAL DATA:  Cough for 1 week, short of breath EXAM: PORTABLE CHEST 1 VIEW COMPARISON:  None Available. FINDINGS: Single frontal view of the chest demonstrates an unremarkable cardiac silhouette. No acute airspace disease, effusion, or pneumothorax. No acute bony abnormalities. S shaped thoracolumbar scoliosis. IMPRESSION: 1. No acute intrathoracic process. Electronically Signed   By: Ozell Daring M.D.   On: 11/26/2024 23:15    MDM & MAU COURSE  MDM: Moderate  MAU Course: -Vital signs within normal limits. Normotensive and afebrile. -Respiratory panel for influenza, COVID, and RSV.  -Patient reports concern for pneumonia, has been messaging with Delon Emms NP about possible need for antibiotics due to symptoms. -Given history of asthma and wheezing on exam, giving Duoneb in MAU. -CXR negative for lobar pneumonia. Given discussion  with previous provider, reasonable to prescribe antibiotics to cover for bacterial URI given length of symptoms at this point.  Differential diagnosis considered for cough includes but is not limited to: viral respiratory tract infection, heart failure, bacterial pneumonia, asthma, COPD, bronchiolitis, foreign body   Orders Placed This Encounter  Procedures   Resp panel by RT-PCR (RSV, Flu A&B, Covid) Anterior Nasal Swab   DG Chest Portable 1 View   Airborne and Contact precautions   Meds ordered this encounter  Medications   ipratropium-albuterol  (DUONEB) 0.5-2.5 (3) MG/3ML nebulizer solution 3 mL    ASSESSMENT   1. Acute bronchitis, unspecified organism   2. Bacterial URI   3. [redacted] weeks gestation of pregnancy     PLAN  Discharge home in stable condition with return precautions.  ***     Allergies as of 11/27/2024       Reactions   Farxiga  [dapagliflozin ] Nausea And Vomiting     Med Rec must be completed prior to using this SMARTLINK***       Joesph DELENA Sear, PA        [1] Social History Tobacco Use   Smoking status: Never   Smokeless tobacco: Never  Vaping Use   Vaping status: Never Used  Substance Use Topics   Alcohol use: Not Currently   Drug use: No  [2] Allergies Allergen Reactions   Farxiga  [Dapagliflozin ] Nausea And Vomiting

## 2024-11-30 ENCOUNTER — Telehealth: Admitting: Obstetrics and Gynecology

## 2024-11-30 DIAGNOSIS — O24112 Pre-existing diabetes mellitus, type 2, in pregnancy, second trimester: Secondary | ICD-10-CM

## 2024-11-30 DIAGNOSIS — Z3A27 27 weeks gestation of pregnancy: Secondary | ICD-10-CM

## 2024-11-30 MED ORDER — GUAIFENESIN-CODEINE 100-10 MG/5ML PO SOLN: 10.0000 mL | Freq: Four times a day (QID) | ORAL | 0 refills | Status: DC | PRN

## 2024-11-30 NOTE — Progress Notes (Signed)
 "   TELEHEALTH OBSTETRICS VISIT ENCOUNTER NOTE  FOR DIABETES MANAGEMENT DURING PREGNANCY    Provider location: [facility] for [facility]   Patient location: Home  I connected with Denise [name] on 11/30/2024 at  1:30 PM EST by telephone at home and verified that I am speaking with the correct person using two identifiers. Of note, unable to do video encounter due to technical difficulties.    I discussed the limitations, risks, security and privacy concerns of performing an evaluation and management service by telephone and the availability of in person appointments. I also discussed with the patient that there may be a patient responsible charge related to this service. The patient expressed understanding and agreed to proceed.   History:   Denise [name] is a 34 y.o. [id number] at [redacted]w[redacted]d by LMP being seen today for diabetes management during pregnancy. [name] obstetrical history is significant for cervical insufficiency.    Patient reports continued URI symptoms, now on antibiotics. Reports fetal movement. Denies any contractions, bleeding or leaking of fluid.   The following portions of the patient's history were reviewed and updated as appropriate: allergies, current medications, past family history, past medical history, past social history, past surgical history and problem list.   Past Medical History:  Diagnosis Date   Allergy    Arthritis    Asthma    Carpal tunnel syndrome of left wrist    Gestational diabetes    History of cervical cerclage 07/28/2013   Rescue cerclage in place     Previous pregnancy with short cervix, but term delivery  This pregnancy, serial cervical lengths:  12/2 cervix long and closed  12/23 cervix 1.4 cm, vaginal progesterone  started  1/6 cervical length 0.2 cm, rescue cerclage was placed     History of pre-eclampsia in prior pregnancy, currently pregnant    Hx of chlamydia infection 2017   Pregnancy induced hypertension    Pre-E    Seasonal allergies    Trichomonas 2017   Past Surgical History:  Procedure Laterality Date   CERVICAL CERCLAGE N/A 11/23/2018   Procedure: CERCLAGE CERVICAL;  Surgeon: Fredirick Glenys RAMAN, MD;  Location: [facility] BIRTHING SUITES;  Service: Gynecology;  Laterality: N/A;   CERVICAL CERCLAGE N/A 08/30/2024   Procedure: CERCLAGE, CERVIX, VAGINAL APPROACH;  Surgeon: Alger Gong, MD;  Location: [facility]S;  Service: Obstetrics;  Laterality: N/A;   DILATION AND CURETTAGE OF UTERUS  05/13/2022   Family History  Problem Relation Age of Onset   Arthritis Mother    Diabetes Mother    Diabetes Maternal Grandmother    Arthritis Maternal Grandmother    Social History[1] Allergies[2] Medications Ordered Prior to Encounter[3]    Objective:   General:  Alert, oriented and cooperative.   Mental Status: Normal mood and affect perceived. Normal judgment and thought content.   The Rest of physical exam deferred due to type of encounter  Hospital admissions related to diabetes none releated to DM. Was seen in MAU recently for URI and Rx for antibiotics. Cough is keeping [name] away, nothing helps [name] cough. Will send RX for codeine .    Type 2 DM Diagnosed    Fetal Echo Normal     Blood sugar monitoring: dexcom in place with data sharing set up.     With CGM, target Blood sugar set at 65-140.   Currently glucose in target range is 25% in target range up from 18%    Back up glucose monitor     Fasting  BS Labile, mostly 100-120, occasional 150   2 hour PP majority >120 overall baseline pan elevated.      Current Diabetes medications:  Denise [name] did not make insulin  changes as requested through communication through [facility].   Omnipod insulin  pump in place> Manual mode.    1.90 units per hour 3 am- 9 am> increase to 2.0 units per hour.  2.15 units per hour. 9am-9pm> increase to 2.20 units per hour.  2.20 units per hour 9 pm- 12 am> Increase to 2.25 units per hour.  2.20 units per hour 12 am- 3 am>  increase to 2.35 units per hour.    Correction factor is set to 16> decrease to 15 Carb ratio is 1:5 Target BS is 110   Would consider admission for DM mangement if BS due not respond to above changes. Recommended this and offered this to Denise [name] this week. Denise [name] declines and voices frustration with the recommendation for admission. Does not feel admission would be best for [name] mental health.  Will be in contact with Denise [name] in 2-3 days to review BS's/ Dexcom Discussed importance of diet/ walking after meals. Low sugar drinks.     Plan:   -Continue above -Keep DM appointments -Keep MFM appointment  -Will discussed delivery around 36-37 weeks with MFM.     -Avoid post meal coverage due hypoglycemia.    -Discussed only 30-40 grams of carbohydrates with each meal, with majority of your meals being protein and high fiber vegetables.    -Start a fiber supplement everyday.    -Protein snack before bed. Recommend 10-20 grams of protein before bed.    -Anticipate weekly diabetes visits until BS's are better controlled -Anticipate frequent in person OB visits along with MFM care.  -q4 week growth US  starting at 20w with antenatal weekly testing starting at 32 weeks or sooner if needed.    -Continue BASA 162 mg      This visit is for the purposes of diabetes management only. Please keep scheduled OB visits with OB team for prenatal care.    Preterm labor symptoms and general obstetric precautions including but not limited to vaginal bleeding, contractions, leaking of fluid and fetal movement were reviewed in detail with the patient.  I discussed the assessment and treatment plan with the patient. The patient was provided an opportunity to ask questions and all were answered. The patient agreed with the plan and demonstrated an understanding of the instructions. The patient was advised to call back or seek an in-person office evaluation/go to MAU at [facility] for any urgent or  concerning symptoms. Please refer to After Visit Summary for other counseling recommendations.    I provided 15 minutes of non-face-to-face time during this encounter.   Denise [name], Delon FERNS, NP [facility] [employer], [facility]         [1]  Social History Tobacco Use   Smoking status: Never   Smokeless tobacco: Never  Vaping Use   Vaping status: Never Used  Substance Use Topics   Alcohol use: Not Currently   Drug use: No  [2]  Allergies Allergen Reactions   Farxiga  [Dapagliflozin ] Nausea And Vomiting  [3]  Current Outpatient Medications on File Prior to Visit  Medication Sig Dispense Refill   albuterol  (VENTOLIN  HFA) 108 (90 Base) MCG/ACT inhaler Inhale 1-2 puffs into the lungs every 6 (six) hours as needed for wheezing or shortness of breath. 1 each 1   aspirin  EC 81 MG tablet  Take 2 tablets (162 mg total) by mouth daily. Swallow whole. 60 tablet 12   azithromycin  (ZITHROMAX ) 250 MG tablet Take as directed: Two pills by mouth the first day, then one pill every day until completed 6 tablet 1   Blood Glucose Monitoring Suppl (ACCU-CHEK GUIDE) w/Device KIT Use as advised 1 kit 0   Budesonide  90 MCG/ACT inhaler Inhale 1-2 puffs into the lungs 2 (two) times daily. 1 each 2   cyclobenzaprine  (FLEXERIL ) 10 MG tablet Take 1 tablet (10 mg total) by mouth 2 (two) times daily as needed. (Patient not taking: Reported on 11/23/2024) 20 tablet 2   cyclobenzaprine  (FLEXERIL ) 10 MG tablet Take 1 tablet (10 mg total) by mouth 3 (three) times daily as needed for muscle spasms. (Patient not taking: Reported on 11/23/2024) 30 tablet 2   escitalopram (LEXAPRO) 20 MG tablet Take 20 mg by mouth daily.     glucose blood (ACCU-CHEK GUIDE) test strip Use as instructed 3-4x a day 300 each 3   Insulin  Disposable Pump (OMNIPOD 5 [id number] PODS GEN 5) MISC Use 1 POD every 2 days 15 each 11   insulin  lispro (HUMALOG ) 100 UNIT/ML injection Max dose 200 units per day. Used  for omnipod insulin  pump, please give 30 day supply 10 mL 11   Insulin  Pen Needle 32G X 4 MM MISC Use 4x a day 300 each 3   metoCLOPramide  (REGLAN ) 10 MG tablet Take 1 tablet (10 mg total) by mouth 4 (four) times daily -  before meals and at bedtime. (Patient not taking: Reported on 11/23/2024) 60 tablet 1   NIFEdipine  (PROCARDIA  XL) 30 MG 24 hr tablet Take 1 tablet (30 mg total) by mouth daily. 60 tablet 3   prochlorperazine  (COMPAZINE ) 10 MG tablet Take 1 tablet (10 mg total) by mouth every 6 (six) hours as needed for nausea or vomiting. (Patient not taking: Reported on 11/23/2024) 120 tablet 0   progesterone  (PROMETRIUM ) 200 MG capsule 400 mg See admin instructions. Taking 200mg  orally at bedtime AND inserting a 200mg  capsule vaginally at bedtime. (Patient not taking: Reported on 11/23/2024)     promethazine  (PHENERGAN ) 25 MG tablet Take 1 tablet (25 mg total) by mouth every 6 (six) hours as needed. May insert vaginally, if unable to keep anything down by mouth (Patient not taking: Reported on 11/23/2024) 120 tablet 0   scopolamine  (TRANSDERM-SCOP) 1 MG/3DAYS Place 1 patch (1 mg total) onto the skin every 3 (three) days. (Patient not taking: Reported on 11/23/2024) 10 patch 0   No current facility-administered medications on file prior to visit.   "

## 2024-11-30 NOTE — Progress Notes (Signed)
 RN attempted to call patient to request to connect to video message, RN left HIPAA compliant voicemail to return office call.  Silvano LELON Piano, RN

## 2024-12-01 ENCOUNTER — Encounter: Admitting: Certified Nurse Midwife

## 2024-12-06 ENCOUNTER — Other Ambulatory Visit: Payer: Self-pay

## 2024-12-06 ENCOUNTER — Other Ambulatory Visit: Payer: Self-pay | Admitting: Obstetrics and Gynecology

## 2024-12-06 ENCOUNTER — Encounter (HOSPITAL_COMMUNITY): Payer: Self-pay | Admitting: Obstetrics and Gynecology

## 2024-12-06 ENCOUNTER — Inpatient Hospital Stay (HOSPITAL_COMMUNITY)
Admission: AD | Admit: 2024-12-06 | Discharge: 2024-12-06 | Disposition: A | Discharge status: Home / Self Care | Attending: Obstetrics and Gynecology | Admitting: Obstetrics and Gynecology

## 2024-12-06 DIAGNOSIS — R0602 Shortness of breath: Secondary | ICD-10-CM | POA: Diagnosis present

## 2024-12-06 DIAGNOSIS — J4521 Mild intermittent asthma with (acute) exacerbation: Secondary | ICD-10-CM | POA: Diagnosis not present

## 2024-12-06 DIAGNOSIS — R059 Cough, unspecified: Secondary | ICD-10-CM | POA: Diagnosis present

## 2024-12-06 DIAGNOSIS — R051 Acute cough: Secondary | ICD-10-CM | POA: Insufficient documentation

## 2024-12-06 DIAGNOSIS — O99512 Diseases of the respiratory system complicating pregnancy, second trimester: Secondary | ICD-10-CM | POA: Insufficient documentation

## 2024-12-06 DIAGNOSIS — Z3A27 27 weeks gestation of pregnancy: Secondary | ICD-10-CM | POA: Insufficient documentation

## 2024-12-06 DIAGNOSIS — J4531 Mild persistent asthma with (acute) exacerbation: Secondary | ICD-10-CM

## 2024-12-06 MED ORDER — IPRATROPIUM-ALBUTEROL 0.5-2.5 (3) MG/3ML IN SOLN
3.0000 mL | Freq: Once | RESPIRATORY_TRACT | Status: AC
  Administered 2024-12-06: 3 mL via RESPIRATORY_TRACT
  Filled 2024-12-06: qty 3

## 2024-12-06 MED ORDER — DM-GUAIFENESIN ER 30-600 MG PO TB12
2.0000 | ORAL_TABLET | Freq: Once | ORAL | Status: AC
  Administered 2024-12-06: 2 via ORAL
  Filled 2024-12-06: qty 2

## 2024-12-06 MED ORDER — HYDROCOD POLI-CHLORPHE POLI ER 10-8 MG/5ML PO SUER: 5.0000 mL | Freq: Two times a day (BID) | ORAL | 0 refills | Status: DC | PRN

## 2024-12-06 NOTE — MAU Provider Note (Signed)
 " History     CSN: [id number]  Arrival date and time: 12/06/24 9973   Event Date/Time   First Provider Initiated Contact with Patient 12/06/24 0200      Chief Complaint  Patient presents with   Shortness of Breath   HPI Denise [name] is a 34 y.o. year old [phone] female at [redacted]w[redacted]d weeks gestation who presents to MAU reporting worsening of her coughing and shortness of breath with chest tightness.  She was seen in MAU on 11/30/2024 with same complaints.  She was prescribed antibiotics and completed those.  She denies pain; only chest discomfort with coughing.  She denies vaginal bleeding or loss of fluid.  She endorses good vigorous fetal movement.  She has a history of asthma using her inhaler as prescribed.  She reports the cough medicine that she was prescribed has not helped her with her coughing.  She took all but 2 pills of Mucinex  that are in her purse now over the course of this illness.  She receives her prenatal care at [facility] care at [facility]; next appointment 12/07/2024.  OB History     Gravida  4   Para  2   Term  1   Preterm  1   AB  1   Living  2      SAB  1   IAB      Ectopic      Multiple      Live Births  2           Past Medical History:  Diagnosis Date   Allergy    Arthritis    Asthma    Carpal tunnel syndrome of left wrist    Gestational diabetes    History of cervical cerclage 07/28/2013   Rescue cerclage in place     Previous pregnancy with short cervix, but term delivery  This pregnancy, serial cervical lengths:  12/2 cervix long and closed  12/23 cervix 1.4 cm, vaginal progesterone  started  1/6 cervical length 0.2 cm, rescue cerclage was placed     History of pre-eclampsia in prior pregnancy, currently pregnant    Hx of chlamydia infection 2017   Pregnancy induced hypertension    Pre-E   Seasonal allergies    Trichomonas 2017    Past Surgical History:  Procedure Laterality Date   CERVICAL CERCLAGE N/A  11/23/2018   Procedure: CERCLAGE CERVICAL;  Surgeon: Fredirick Glenys RAMAN, MD;  Location: [facility] BIRTHING SUITES;  Service: Gynecology;  Laterality: N/A;   CERVICAL CERCLAGE N/A 08/30/2024   Procedure: CERCLAGE, CERVIX, VAGINAL APPROACH;  Surgeon: Alger Gong, MD;  [facility]: [facility];  Service: Obstetrics;  Laterality: N/A;   DILATION AND CURETTAGE OF UTERUS  05/13/2022    Family History  Problem Relation Age of Onset   Arthritis Mother    Diabetes Mother    Diabetes Maternal Grandmother    Arthritis Maternal Grandmother     Social History[1]  Allergies: Allergies[2]  Medications Prior to Admission  Medication Sig Dispense Refill Last Dose/Taking   albuterol  (VENTOLIN  HFA) 108 (90 Base) MCG/ACT inhaler Inhale 1-2 puffs into the lungs every 6 (six) hours as needed for wheezing or shortness of breath. 1 each 1 12/05/2024 at 12:30 PM   aspirin  EC 81 MG tablet Take 2 tablets (162 mg total) by mouth daily. Swallow whole. 60 tablet 12 Past Week   escitalopram (LEXAPRO) 20 MG tablet Take 20 mg by mouth daily.   Past Week  NIFEdipine  (PROCARDIA  XL) 30 MG 24 hr tablet Take 1 tablet (30 mg total) by mouth daily. 60 tablet 3 Past Week   azithromycin  (ZITHROMAX ) 250 MG tablet Take as directed: Two pills by mouth the first day, then one pill every day until completed 6 tablet 1    Blood Glucose Monitoring Suppl (ACCU-CHEK GUIDE) w/Device KIT Use as advised 1 kit 0    Budesonide  90 MCG/ACT inhaler Inhale 1-2 puffs into the lungs 2 (two) times daily. 1 each 2    cyclobenzaprine  (FLEXERIL ) 10 MG tablet Take 1 tablet (10 mg total) by mouth 2 (two) times daily as needed. (Patient not taking: Reported on 11/23/2024) 20 tablet 2    cyclobenzaprine  (FLEXERIL ) 10 MG tablet Take 1 tablet (10 mg total) by mouth 3 (three) times daily as needed for muscle spasms. (Patient not taking: Reported on 11/23/2024) 30 tablet 2    glucose blood (ACCU-CHEK GUIDE) test strip Use as instructed 3-4x a day 300 each 3     guaiFENesin -codeine  100-10 MG/5ML syrup Take 10 mLs by mouth every 6 (six) hours as needed for cough. 120 mL 0    Insulin  Disposable Pump (OMNIPOD 5 [id number] PODS GEN 5) MISC Use 1 POD every 2 days 15 each 11    insulin  lispro (HUMALOG ) 100 UNIT/ML injection Max dose 200 units per day. Used for omnipod insulin  pump, please give 30 day supply 10 mL 11    Insulin  Pen Needle 32G X 4 MM MISC Use 4x a day 300 each 3    metoCLOPramide  (REGLAN ) 10 MG tablet Take 1 tablet (10 mg total) by mouth 4 (four) times daily -  before meals and at bedtime. (Patient not taking: Reported on 11/23/2024) 60 tablet 1    prochlorperazine  (COMPAZINE ) 10 MG tablet Take 1 tablet (10 mg total) by mouth every 6 (six) hours as needed for nausea or vomiting. (Patient not taking: Reported on 11/23/2024) 120 tablet 0    progesterone  (PROMETRIUM ) 200 MG capsule 400 mg See admin instructions. Taking 200mg  orally at bedtime AND inserting a 200mg  capsule vaginally at bedtime. (Patient not taking: Reported on 11/23/2024)      promethazine  (PHENERGAN ) 25 MG tablet Take 1 tablet (25 mg total) by mouth every 6 (six) hours as needed. May insert vaginally, if unable to keep anything down by mouth (Patient not taking: Reported on 11/23/2024) 120 tablet 0    scopolamine  (TRANSDERM-SCOP) 1 MG/3DAYS Place 1 patch (1 mg total) onto the skin every 3 (three) days. (Patient not taking: Reported on 11/23/2024) 10 patch 0     Review of Systems  Constitutional: Negative.   HENT: Negative.    Eyes: Negative.   Respiratory:  Positive for cough, chest tightness and shortness of breath.   Cardiovascular: Negative.   Gastrointestinal: Negative.   Endocrine: Negative.   Genitourinary: Negative.   Musculoskeletal: Negative.   Skin: Negative.   Allergic/Immunologic: Negative.   Neurological: Negative.   Hematological: Negative.   Psychiatric/Behavioral: Negative.     Physical Exam   Blood pressure 131/86, pulse 100, temperature 98.5 F (36.9 C), temperature  source Oral, resp. rate 20, last menstrual period 05/25/2024, SpO2 99%, unknown if currently breastfeeding.  Physical Exam Vitals and nursing note reviewed. Exam conducted with a chaperone present (Amy S, RN).  Constitutional:      Appearance: Normal appearance. She is obese.  Cardiovascular:     Rate and Rhythm: Normal rate and regular rhythm.  Pulmonary:     Effort: Pulmonary effort is normal.  Breath sounds: Normal breath sounds.  Abdominal:     General: Bowel sounds are normal.     Palpations: Abdomen is soft.  Genitourinary:    Comments: Not indicated Musculoskeletal:        General: Normal range of motion.  Skin:    General: Skin is warm and dry.  Neurological:     Mental Status: She is alert and oriented to person, place, and time.  Psychiatric:        Mood and Affect: Mood normal.        Behavior: Behavior normal.        Thought Content: Thought content normal.        Judgment: Judgment normal.    REACTIVE NST - FHR: 130 bpm / moderate variability / accels present / decels absent / TOCO: none  Reassessment @ 0330: Patient reports no coughing since breathing tx.  MAU Course  Procedures  MDM Respiratory Panel collected, by RN -- not sent DuoNeb Breathing Tx Mucinex  1200 mg po    Assessment and Plan  1. Mild persistent asthma with exacerbation (Primary) - Information provided on asthma in adult and asthma action plan - Ambulatory Referral to Asthma & Allergy (Dr. Jeneal)  2. Acute cough - Information provided on cough in adult   3. [redacted] weeks gestation of pregnancy   - Discharge home - Keep scheduled appt with [facility] on 12/07/2024 - Patient verbalized an understanding of the plan of care and agrees.   Ala Cart, CNM 12/06/2024, 2:23 AM     [1]  Social History Tobacco Use   Smoking status: Never   Smokeless tobacco: Never  Vaping Use   Vaping status: Never Used  Substance Use Topics   Alcohol use: Not Currently   Drug use: No  [2]   Allergies Allergen Reactions   Farxiga  [Dapagliflozin ] Nausea And Vomiting   "

## 2024-12-06 NOTE — MAU Note (Signed)
 Patient is a [id number], [redacted]w[redacted]d that presents with complaint of coughing and SOB with tightness. Patient states that she was previously seen for same symptoms and she was on antibiotics and took all of them as prescribed. No pain, patient just states some chest discomfort when she coughs. No LOF or bleeding. +FM. BP 131/86. Afebrile, 98.6. FHT 145. Patient has inhaler at home that she uses.

## 2024-12-07 ENCOUNTER — Ambulatory Visit: Admitting: Obstetrics and Gynecology

## 2024-12-07 VITALS — BP 131/80 | HR 103 | Wt 263.0 lb

## 2024-12-07 DIAGNOSIS — O0990 Supervision of high risk pregnancy, unspecified, unspecified trimester: Secondary | ICD-10-CM | POA: Diagnosis not present

## 2024-12-07 DIAGNOSIS — O1213 Gestational proteinuria, third trimester: Secondary | ICD-10-CM

## 2024-12-07 DIAGNOSIS — O3110X0 Continuing pregnancy after spontaneous abortion of one fetus or more, unspecified trimester, not applicable or unspecified: Secondary | ICD-10-CM

## 2024-12-07 DIAGNOSIS — O09292 Supervision of pregnancy with other poor reproductive or obstetric history, second trimester: Secondary | ICD-10-CM

## 2024-12-07 DIAGNOSIS — I10 Essential (primary) hypertension: Secondary | ICD-10-CM | POA: Diagnosis not present

## 2024-12-07 DIAGNOSIS — O24112 Pre-existing diabetes mellitus, type 2, in pregnancy, second trimester: Secondary | ICD-10-CM

## 2024-12-07 DIAGNOSIS — J069 Acute upper respiratory infection, unspecified: Secondary | ICD-10-CM

## 2024-12-07 MED ORDER — AZITHROMYCIN 250 MG PO TABS: 250.0000 mg | ORAL_TABLET | Freq: Every day | ORAL | 0 refills | Status: DC

## 2024-12-07 MED ORDER — AMOXICILLIN 500 MG PO TABS: 1000.0000 mg | ORAL_TABLET | Freq: Three times a day (TID) | ORAL | 0 refills | Status: DC

## 2024-12-07 MED ORDER — ALBUTEROL SULFATE 1.25 MG/3ML IN NEBU: 1.0000 | INHALATION_SOLUTION | Freq: Four times a day (QID) | RESPIRATORY_TRACT | 12 refills | Status: DC | PRN
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
## 2024-12-07 NOTE — Addendum Note (Signed)
 Addended by: [name] on: 12/07/2024 05:17 PM   Modules accepted: Orders

## 2024-12-09 ENCOUNTER — Telehealth: Payer: Self-pay

## 2024-12-09 MED ORDER — AZITHROMYCIN 200 MG/5ML PO SUSR: 250.0000 mg | Freq: Every day | ORAL | 0 refills | Status: AC

## 2024-12-09 MED ORDER — AMOXICILLIN 400 MG/5ML PO SUSR: 1000.0000 mg | Freq: Three times a day (TID) | ORAL | 0 refills | Status: AC

## 2024-12-09 NOTE — Telephone Encounter (Signed)
 Pt sent message in [place] regarding needing a call from medical advice. RN called patient. Pt reported having tightness in belly, throat and nose burning, Pt reported has been in pain since 2 pm. Pt reported having trouble walking. Pt reported pain is now becoming unbearable. Pt reported having increased vaginal pressure, having to hold belly up to relieve the pressure. RN advised for patient to be evaluated at [facility]. Pt verbalized understanding.  Silvano LELON Piano, RN

## 2024-12-10 ENCOUNTER — Ambulatory Visit

## 2024-12-10 ENCOUNTER — Encounter (HOSPITAL_COMMUNITY): Payer: Self-pay | Admitting: Obstetrics & Gynecology

## 2024-12-10 ENCOUNTER — Inpatient Hospital Stay (HOSPITAL_COMMUNITY)

## 2024-12-10 ENCOUNTER — Inpatient Hospital Stay (HOSPITAL_COMMUNITY)
Admission: AD | Admit: 2024-12-10 | Discharge: 2024-12-12 | DRG: 831 | Disposition: A | Payer: Self-pay | Attending: Obstetrics & Gynecology | Admitting: Obstetrics & Gynecology

## 2024-12-10 ENCOUNTER — Ambulatory Visit: Admitting: Obstetrics

## 2024-12-10 DIAGNOSIS — O99213 Obesity complicating pregnancy, third trimester: Secondary | ICD-10-CM | POA: Diagnosis present

## 2024-12-10 DIAGNOSIS — O3110X0 Continuing pregnancy after spontaneous abortion of one fetus or more, unspecified trimester, not applicable or unspecified: Secondary | ICD-10-CM | POA: Diagnosis not present

## 2024-12-10 DIAGNOSIS — O3433 Maternal care for cervical incompetence, third trimester: Secondary | ICD-10-CM | POA: Diagnosis not present

## 2024-12-10 DIAGNOSIS — O99513 Diseases of the respiratory system complicating pregnancy, third trimester: Secondary | ICD-10-CM | POA: Diagnosis present

## 2024-12-10 DIAGNOSIS — O26873 Cervical shortening, third trimester: Secondary | ICD-10-CM

## 2024-12-10 DIAGNOSIS — R7309 Other abnormal glucose: Secondary | ICD-10-CM | POA: Diagnosis not present

## 2024-12-10 DIAGNOSIS — Z794 Long term (current) use of insulin: Secondary | ICD-10-CM

## 2024-12-10 DIAGNOSIS — Z3A28 28 weeks gestation of pregnancy: Secondary | ICD-10-CM

## 2024-12-10 DIAGNOSIS — E11649 Type 2 diabetes mellitus with hypoglycemia without coma: Secondary | ICD-10-CM | POA: Diagnosis not present

## 2024-12-10 DIAGNOSIS — Z7982 Long term (current) use of aspirin: Secondary | ICD-10-CM | POA: Diagnosis not present

## 2024-12-10 DIAGNOSIS — Z7984 Long term (current) use of oral hypoglycemic drugs: Secondary | ICD-10-CM | POA: Diagnosis not present

## 2024-12-10 DIAGNOSIS — O09292 Supervision of pregnancy with other poor reproductive or obstetric history, second trimester: Secondary | ICD-10-CM

## 2024-12-10 DIAGNOSIS — O285 Abnormal chromosomal and genetic finding on antenatal screening of mother: Secondary | ICD-10-CM | POA: Insufficient documentation

## 2024-12-10 DIAGNOSIS — F32A Depression, unspecified: Secondary | ICD-10-CM | POA: Diagnosis present

## 2024-12-10 DIAGNOSIS — E1165 Type 2 diabetes mellitus with hyperglycemia: Secondary | ICD-10-CM | POA: Diagnosis present

## 2024-12-10 DIAGNOSIS — Z362 Encounter for other antenatal screening follow-up: Secondary | ICD-10-CM | POA: Insufficient documentation

## 2024-12-10 DIAGNOSIS — E119 Type 2 diabetes mellitus without complications: Secondary | ICD-10-CM

## 2024-12-10 DIAGNOSIS — O403XX0 Polyhydramnios, third trimester, not applicable or unspecified: Secondary | ICD-10-CM | POA: Diagnosis present

## 2024-12-10 DIAGNOSIS — O3432 Maternal care for cervical incompetence, second trimester: Secondary | ICD-10-CM

## 2024-12-10 DIAGNOSIS — F419 Anxiety disorder, unspecified: Secondary | ICD-10-CM | POA: Diagnosis present

## 2024-12-10 DIAGNOSIS — O0990 Supervision of high risk pregnancy, unspecified, unspecified trimester: Secondary | ICD-10-CM

## 2024-12-10 DIAGNOSIS — J45909 Unspecified asthma, uncomplicated: Secondary | ICD-10-CM | POA: Diagnosis present

## 2024-12-10 DIAGNOSIS — O09213 Supervision of pregnancy with history of pre-term labor, third trimester: Secondary | ICD-10-CM

## 2024-12-10 DIAGNOSIS — O99343 Other mental disorders complicating pregnancy, third trimester: Secondary | ICD-10-CM | POA: Diagnosis present

## 2024-12-10 DIAGNOSIS — O1210 Gestational proteinuria, unspecified trimester: Secondary | ICD-10-CM | POA: Diagnosis present

## 2024-12-10 DIAGNOSIS — O24113 Pre-existing diabetes mellitus, type 2, in pregnancy, third trimester: Principal | ICD-10-CM | POA: Diagnosis present

## 2024-12-10 DIAGNOSIS — Z8759 Personal history of other complications of pregnancy, childbirth and the puerperium: Secondary | ICD-10-CM

## 2024-12-10 DIAGNOSIS — O10913 Unspecified pre-existing hypertension complicating pregnancy, third trimester: Secondary | ICD-10-CM | POA: Diagnosis present

## 2024-12-10 DIAGNOSIS — J189 Pneumonia, unspecified organism: Secondary | ICD-10-CM | POA: Diagnosis present

## 2024-12-10 DIAGNOSIS — I10 Essential (primary) hypertension: Secondary | ICD-10-CM | POA: Diagnosis present

## 2024-12-10 DIAGNOSIS — O24112 Pre-existing diabetes mellitus, type 2, in pregnancy, second trimester: Secondary | ICD-10-CM | POA: Diagnosis not present

## 2024-12-10 DIAGNOSIS — O09293 Supervision of pregnancy with other poor reproductive or obstetric history, third trimester: Secondary | ICD-10-CM | POA: Insufficient documentation

## 2024-12-10 DIAGNOSIS — Z8751 Personal history of pre-term labor: Secondary | ICD-10-CM

## 2024-12-10 DIAGNOSIS — O26879 Cervical shortening, unspecified trimester: Secondary | ICD-10-CM

## 2024-12-10 DIAGNOSIS — Z9641 Presence of insulin pump (external) (internal): Secondary | ICD-10-CM | POA: Diagnosis present

## 2024-12-10 DIAGNOSIS — Z833 Family history of diabetes mellitus: Secondary | ICD-10-CM | POA: Diagnosis not present

## 2024-12-10 DIAGNOSIS — Z148 Genetic carrier of other disease: Secondary | ICD-10-CM | POA: Insufficient documentation

## 2024-12-10 DIAGNOSIS — O26893 Other specified pregnancy related conditions, third trimester: Secondary | ICD-10-CM | POA: Diagnosis not present

## 2024-12-10 DIAGNOSIS — Z87898 Personal history of other specified conditions: Secondary | ICD-10-CM

## 2024-12-10 DIAGNOSIS — O09893 Supervision of other high risk pregnancies, third trimester: Secondary | ICD-10-CM | POA: Insufficient documentation

## 2024-12-10 LAB — CBC
HCT: 34.9 % — ABNORMAL LOW (ref 36.0–46.0)
Hemoglobin: 11.5 g/dL — ABNORMAL LOW (ref 12.0–15.0)
MCH: 25.3 pg — ABNORMAL LOW (ref 26.0–34.0)
MCHC: 33 g/dL (ref 30.0–36.0)
MCV: 76.7 fL — ABNORMAL LOW (ref 80.0–100.0)
Platelets: 287 K/uL (ref 150–400)
RBC: 4.55 MIL/uL (ref 3.87–5.11)
RDW: 14.5 % (ref 11.5–15.5)
WBC: 5.3 K/uL (ref 4.0–10.5)
nRBC: 0 % (ref 0.0–0.2)

## 2024-12-10 LAB — COMPREHENSIVE METABOLIC PANEL WITH GFR
ALT: 15 U/L (ref 0–44)
AST: 21 U/L (ref 15–41)
Albumin: 3.3 g/dL — ABNORMAL LOW (ref 3.5–5.0)
Alkaline Phosphatase: 91 U/L (ref 38–126)
Anion gap: 11 (ref 5–15)
BUN: 8 mg/dL (ref 6–20)
CO2: 19 mmol/L — ABNORMAL LOW (ref 22–32)
Calcium: 9.4 mg/dL (ref 8.9–10.3)
Chloride: 103 mmol/L (ref 98–111)
Creatinine, Ser: 0.73 mg/dL (ref 0.44–1.00)
GFR, Estimated: >60 mL/min
Glucose, Bld: 144 mg/dL — ABNORMAL HIGH (ref 70–99)
Potassium: 3.8 mmol/L (ref 3.5–5.1)
Sodium: 134 mmol/L — ABNORMAL LOW (ref 135–145)
Total Bilirubin: 0.2 mg/dL (ref 0.0–1.2)
Total Protein: 6.7 g/dL (ref 6.5–8.1)

## 2024-12-10 LAB — HEMOGLOBIN A1C
Hgb A1c MFr Bld: 7.4 % — ABNORMAL HIGH (ref 4.8–5.6)
Mean Plasma Glucose: 165.68 mg/dL

## 2024-12-10 LAB — TYPE AND SCREEN
ABO/RH(D): B POS
Antibody Screen: NEGATIVE

## 2024-12-10 MED ORDER — METFORMIN HCL 500 MG PO TABS
1000.0000 mg | ORAL_TABLET | Freq: Two times a day (BID) | ORAL | Status: DC
  Administered 2024-12-10: 1000 mg via ORAL
  Filled 2024-12-10 (×2): qty 2

## 2024-12-10 MED ORDER — ESCITALOPRAM OXALATE 10 MG PO TABS
20.0000 mg | ORAL_TABLET | Freq: Every day | ORAL | Status: DC
  Administered 2024-12-10 – 2024-12-12 (×2): 20 mg via ORAL
  Filled 2024-12-10 (×3): qty 2

## 2024-12-10 MED ORDER — CALCIUM CARBONATE ANTACID 500 MG PO CHEW: 2.0000 | CHEWABLE_TABLET | ORAL | Status: DC | PRN

## 2024-12-10 MED ORDER — ZOLPIDEM TARTRATE 5 MG PO TABS: 5.0000 mg | ORAL_TABLET | Freq: Every evening | ORAL | Status: DC | PRN

## 2024-12-10 MED ORDER — NIFEDIPINE ER OSMOTIC RELEASE 30 MG PO TB24
30.0000 mg | ORAL_TABLET | Freq: Every day | ORAL | Status: DC
  Administered 2024-12-10 – 2024-12-12 (×3): 30 mg via ORAL
  Filled 2024-12-10 (×3): qty 1

## 2024-12-10 MED ORDER — ACETAMINOPHEN 325 MG PO TABS: 650.0000 mg | ORAL_TABLET | ORAL | Status: DC | PRN

## 2024-12-10 MED ORDER — SCOPOLAMINE 1 MG/3DAYS TD PT72: 1.0000 | MEDICATED_PATCH | TRANSDERMAL | Status: DC

## 2024-12-10 MED ORDER — CYCLOBENZAPRINE HCL 10 MG PO TABS: 10.0000 mg | ORAL_TABLET | Freq: Three times a day (TID) | ORAL | Status: DC | PRN

## 2024-12-10 MED ORDER — AZITHROMYCIN 500 MG PO TABS
500.0000 mg | ORAL_TABLET | Freq: Every day | ORAL | Status: AC
  Administered 2024-12-10: 500 mg via ORAL
  Filled 2024-12-10: qty 1

## 2024-12-10 MED ORDER — PROMETHAZINE HCL 25 MG PO TABS: 25.0000 mg | ORAL_TABLET | Freq: Four times a day (QID) | ORAL | Status: DC | PRN

## 2024-12-10 MED ORDER — INSULIN PUMP
Freq: Three times a day (TID) | SUBCUTANEOUS | Status: DC
  Filled 2024-12-10: qty 1

## 2024-12-10 MED ORDER — ONDANSETRON HCL 4 MG/2ML IJ SOLN
4.0000 mg | Freq: Four times a day (QID) | INTRAMUSCULAR | Status: DC | PRN
  Administered 2024-12-12: 4 mg via INTRAVENOUS
  Filled 2024-12-10: qty 2

## 2024-12-10 MED ORDER — DOCUSATE SODIUM 100 MG PO CAPS
100.0000 mg | ORAL_CAPSULE | Freq: Every day | ORAL | Status: DC
  Filled 2024-12-10 (×2): qty 1

## 2024-12-10 MED ORDER — PROMETHAZINE HCL 25 MG RE SUPP: 25.0000 mg | Freq: Four times a day (QID) | RECTAL | Status: DC | PRN

## 2024-12-10 MED ORDER — ALBUTEROL SULFATE (2.5 MG/3ML) 0.083% IN NEBU
2.5000 mg | INHALATION_SOLUTION | Freq: Four times a day (QID) | RESPIRATORY_TRACT | Status: DC | PRN
  Administered 2024-12-11: 2.5 mg via RESPIRATORY_TRACT
  Filled 2024-12-10: qty 3

## 2024-12-10 MED ORDER — AZITHROMYCIN 250 MG PO TABS
250.0000 mg | ORAL_TABLET | Freq: Every day | ORAL | Status: DC
  Administered 2024-12-11 – 2024-12-12 (×2): 250 mg via ORAL
  Filled 2024-12-10 (×2): qty 1

## 2024-12-10 MED ORDER — ASPIRIN 81 MG PO TBEC
162.0000 mg | DELAYED_RELEASE_TABLET | Freq: Every day | ORAL | Status: DC
  Administered 2024-12-10 – 2024-12-12 (×3): 162 mg via ORAL
  Filled 2024-12-10 (×3): qty 2

## 2024-12-10 MED ORDER — SODIUM CHLORIDE 0.9 % IV SOLN
2.0000 g | INTRAVENOUS | Status: DC
  Administered 2024-12-10 – 2024-12-12 (×3): 2 g via INTRAVENOUS
  Filled 2024-12-10 (×3): qty 20

## 2024-12-10 MED ORDER — ENOXAPARIN SODIUM 40 MG/0.4ML IJ SOSY: 40.0000 mg | PREFILLED_SYRINGE | INTRAMUSCULAR | Status: DC

## 2024-12-10 MED ORDER — PRENATAL MULTIVITAMIN CH
1.0000 | ORAL_TABLET | Freq: Every day | ORAL | Status: DC
  Administered 2024-12-11 – 2024-12-12 (×2): 1 via ORAL
  Filled 2024-12-10 (×2): qty 1

## 2024-12-10 NOTE — Progress Notes (Signed)
 Other physician to see the patient

## 2024-12-10 NOTE — Inpatient Diabetes Management (Signed)
 Inpatient Diabetes Program Recommendations  AACE/ADA: New Consensus Statement on Inpatient Glycemic Control (2015)  Target Ranges:  Prepandial:   less than 140 mg/dL      Peak postprandial:   less than 180 mg/dL (1-2 hours)      Critically ill patients:  140 - 180 mg/dL   Lab Results  Component Value Date   GLUCAP 112 (H) 08/30/2024   HGBA1C 8.2 (H) 10/20/2024    Review of Glycemic Control  Diabetes history: Diabetes Type 2 Diagnosed 2020 Outpatient Diabetes medications:  Omnipod 5 Dexcom G7 Humalog  insulin  Basal rates: 12A-3A 2.45 units/hour 3A-9A  2.15 units/hour 9A-9P  2.3 units/hour 9P-12A 2.35 units/hour  Total amount of basal in 24 hour period 54.9 units.  Carb Ratio   1 unit for every 5 grams of carbs Sensitivity/Correction  1 unit lowers glucose 15 points  Target glucose 110 Insulin  active time  3 >>2.5 (changes on 1/23)  Metformin  restarted outpt (1/23) Current orders for Inpatient glycemic control:  Insulin  pump  Endocrinology in the past [facility]  Inpatient Diabetes Program Recommendations:    -   Needs insulin  pump order set if not already ordered.   -   keep pt on insulin  pump for the next 24-48 hours while on controlled Diet to assess insulin  needs and potential insulin  adjustments via insulin  pump managed by Delon Emms, NP, we will assist with glucose trends and insulin  dosing recommendations.  Spoke with pt in regards to insulin  pump settings, glucose control outpatient and Diabetes history. Pt reports overall lowered her glucose trends from a 14% A1c since time of diagnosis. Pt reports having a few days without her insulin  pump pods due to issues with the pharmacy and life with 2 young kids. Counseled her and discussed extensively diet recommendations for pregnancy. Really stressed to pt the reasons behind why we control glucose levels, how the blood flow effects growth and complications to baby and to her. Discussed possible effects of  steroids. Portion sizes and general carb counting covered with pt.  Thanks, Clotilda Bull RN, MSN, BC-ADM Inpatient Diabetes Coordinator Team Pager [phone] (8a-5p)
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
## 2024-12-10 NOTE — Progress Notes (Signed)
 Patient ID: Denise [name], female   DOB: 21-Mar-1991, 34 y.o.   MRN: [id number]  Being admitted for IP diabetes management/nutrition consult/ and acute bronchitis/pneumonia.  Challenges in diabetes management despite close outpatient follow up and frequent diabetes check in's, also now with URI which has caused further difficulties in BS management.    Current Diabetes management: Confirmed today.    Omnipod insulin  pump in place> Manual mode.    2.15 units per hour 3 am-9 am 2.3 units per hour. 9am-9pm 2.35 units per hour 9 pm- 12 am 2.45 units per hour 12 am- 3 am  Total amount of basal in 24 hour period 54.9 units.    Correction factor is set to 15 Carb ratio is 1:5 Target BS is 110> unable to change due to pump blocks.   Insulin  duration changed from 3 hour to 2.5 hours today while IP  Start metformin  1000 mg BID today while IP  Discussed plan of care with IP diabetes educator Clotilda who recommends no changes made to pump over the next 12 hours to see BS values while compliant on DM diet.   Denise [name] is agreeable to remove pump if that is what is recommended by IP team.  She is also agreeable to initiating Lantus  BID if BS remain unresponsive to basal changes.   Dexcom in place, currently 30% of BS are within target range (65-140)  0% low readings   64% high, 6% very high.   Continue antibiotics per Dr. Jill recommendations. Will consider steroids per MFM recs if symptoms do not improve over the next 24 hours while IP. Chest X ray pending.  [name], NP 12/10/2024 4:55 PM [phone] (cell)

## 2024-12-11 ENCOUNTER — Encounter (HOSPITAL_COMMUNITY): Payer: Self-pay | Admitting: Obstetrics & Gynecology

## 2024-12-11 DIAGNOSIS — O24113 Pre-existing diabetes mellitus, type 2, in pregnancy, third trimester: Principal | ICD-10-CM

## 2024-12-11 DIAGNOSIS — Z3A28 28 weeks gestation of pregnancy: Secondary | ICD-10-CM | POA: Diagnosis not present

## 2024-12-11 LAB — GLUCOSE, CAPILLARY: Glucose-Capillary: 124 mg/dL — ABNORMAL HIGH (ref 70–99)

## 2024-12-11 MED ORDER — IPRATROPIUM-ALBUTEROL 0.5-2.5 (3) MG/3ML IN SOLN: 3.0000 mL | Freq: Four times a day (QID) | RESPIRATORY_TRACT | Status: DC | PRN

## 2024-12-11 MED ORDER — HYDROCODONE BIT-HOMATROP MBR 5-1.5 MG/5ML PO SOLN
5.0000 mL | Freq: Four times a day (QID) | ORAL | Status: DC | PRN
  Administered 2024-12-11: 5 mL via ORAL
  Filled 2024-12-11: qty 5

## 2024-12-11 MED ORDER — METFORMIN HCL 500 MG PO TABS
500.0000 mg | ORAL_TABLET | Freq: Two times a day (BID) | ORAL | Status: DC
  Administered 2024-12-11 – 2024-12-12 (×3): 500 mg via ORAL
  Filled 2024-12-11 (×3): qty 1

## 2024-12-11 MED ORDER — BENZONATATE 100 MG PO CAPS
200.0000 mg | ORAL_CAPSULE | Freq: Three times a day (TID) | ORAL | Status: DC | PRN
  Administered 2024-12-12: 200 mg via ORAL
  Filled 2024-12-11 (×3): qty 2

## 2024-12-11 MED ORDER — ACETAMINOPHEN 160 MG/5ML PO SOLN
650.0000 mg | ORAL | Status: DC | PRN
  Administered 2024-12-11: 650 mg via ORAL
  Filled 2024-12-11: qty 20.3

## 2024-12-11 MED ORDER — DEXTROMETHORPHAN POLISTIREX ER 30 MG/5ML PO SUER
30.0000 mg | Freq: Two times a day (BID) | ORAL | Status: DC | PRN
  Administered 2024-12-11: 30 mg via ORAL
  Filled 2024-12-11 (×2): qty 5

## 2024-12-11 NOTE — Progress Notes (Cosign Needed Addendum)
 Patient ID: Denise [name], female   DOB: [birth year], 34 y.o.   MRN: [id number]  Reviewed Dexcom remotely and discussed progress with [name] over the phone.   Overall baseline with much improvement. Baseline now 110's with very minimal meals peaks. Continued elevated baseline around 12am-3 am.   She has been compliant with diabetic diet.   Changes to Omnipod pump settings made:  2.45 units per hour 12 am- 3 am> change to 2.60 units   2.15 units per hour 3 am-9 am > change to 2.25 units  2.3 units per hour. 9am-9pm> change to 2.40 units  2.35 units per hour 9 pm- 12 am> change to 2.45 units   Correction factor decrease to 10, down from 15 Carb ratio > no change  Target glucose 110 (unable to lower due to pump locks) Duration of insulin  action now 2.5 hours (could consider changing to 2 hours)  She continues to complain of cough and overall feeling sick. Chest Xray normal, and CBC without leukocytosis. Will order nebulizer treatment and a dose of codeine  cough medication. Plan for DC tomorrow.   A1c continues to improvement tremendously.   Reviewed patient and plan of care with Dr. Jayne.  Dorita Delon FERNS, NP 12/11/2024 5:52 PM

## 2024-12-11 NOTE — Discharge Instructions (Signed)
Gestational Diabetes Nutrition Therapy   ~Gestational diabetes (GDM) is a condition that only occurs during pregnancy. GDM leads to blood glucose levels that are above the healthy range. ~Good nutrition is important for a healthy pregnancy and can help manage blood glucose levels during your pregnancy. Over time, high blood glucose levels could hurt you and your baby. Higher blood glucose levels can cause the baby to have a higher risk of:     Growing too large and making delivery difficult.     Diabetes and obesity in the future as adult.     A seriously low blood glucose level after birth ~Eating a healthy diet and gaining the right amount of weight can help manage GDM. Being careful about what kinds of carbohydrate you eat may help control your blood glucose levels so you have a healthy baby. ~You get calories from carbohydrate, protein, and fat in your food. After you eat, your body digests your food, and your blood glucose rises. If your blood glucose goes up too much, it is not healthy. Carbohydrates make your blood glucose levels go up the most. Watching the type and amount of carbohydrate you eat, and when you eat carbohydrate keeps your blood glucose from being too high.It's important to still eat some carbohydrates, for a healthy pregnancy and healthy baby.  Foods with Carbohydrate:    Breads, crackers, and cereals    Pasta, rice, and grains    Starchy vegetables, such as potatoes, corn, peas, and winter squash    Beans and legumes    Milk, some soymilk (check label), and yogurt    Fruits and fruit juices    Sweets, such as cakes, cookies, ice cream, jam, and jelly    Sweet drinks, such as soda, energy drinks, sweet tea and flavored coffee  Foods That Do Not Raise Blood Glucose Levels These foods contain no carbohydrate or very small amounts carbohydrate: Protein foods:    Meat (beef, pork, and lamb)    Poultry (chicken and [place])    Fish and seafood    Eggs    Nuts,  seeds, and nut butters    Cheese and cottage cheese    Tofu Fats:    Oils (olive, peanut, and canola)    Butter and margarine    Salad dressing and mayonnaise Non-starchy vegetables:    Asparagus, broccoli, carrots, green beans, leafy greens (kale, lettuce, spinach, swiss chard), onions, peppers,    tomatoes, zucchini  Tips: To keep your blood glucose in a healthy range, you will need to carefully plan your meals as well as regularly check your blood glucose levels and change the carbohydrate you eat if necessary. To reach the blood glucose goals your health care provider has set for you, you will need to pay attention to the following:                      How much carbohydrate foods you eat.                      The type of carbohydrates you eat                      When you eat carbohydrate foods throughout the day                      Combining protein and fat with carbohydrate  The Right Amount of Carbohydrate and When to Eat Them ~  Eating the right amount of carbohydrate at the right time can keep your blood glucose levels in a healthy range. Your registered dietitian nutritionist (RDN) can help you determine the right amount for you. ~Eating too much carbohydrate at one meal or in one snack can make blood glucose levels rise too high. This can be harmful to your baby. ~Eat 3 meals per day and up to 3 snacks per day. Shoot for 45 g of Carbohydrate at breakfast, 60 grams at lunch and dinner, 30    grams at snacks ~Aim to eat meals 3 to 5 hours apart. If you tend to go longer than 4 to 5 hours between meals, make sure to eat a snack about halfway between meals. This helps to keep your blood glucose levels stable and helps you be less hungry.  Estimating Your Carbohydrate Intake Carbohydrate counting is a meal planning tool to help you eat the right amount of carbohydrate. Count grams of carbohydrate in a specific food or carbohydrate servings to help you figure out how much  carbohydrate is in a food. This will help you plan how much to eat at meals and snacks. Ask your RDN how much carbohydrate you should be eating: Check serving sizes with measuring cups and spoons or a food scale. 15 grams of carbohydrate is a common serving size and is often used as a carbohydrate serving in carbohydrate counting. Read the Nutrition Facts on food labels to find out how many grams of carbohydrate are in foods you eat. Note the serving size on a product label may not be the same as a 15-gram carbohydrate serving. Checking your blood glucose level before and after meals as advised by your health care provider or RDN is the only way to know if your blood glucose is in a safe range. Your blood glucose readings can help you know when to change the amount, type, or timing of the carbohydrates you eat. By checking your blood glucose, you may also find that some carbohydrate foods cause your blood glucose to go above the healthy range each time you eat them.        Foods Not Recommended :      Copyright 2021  [employer]. All rights reserved.

## 2024-12-11 NOTE — Plan of Care (Signed)
" °  Problem: Education: Goal: Knowledge of General Education information will improve Description: Including pain rating scale, medication(s)/side effects and non-pharmacologic comfort measures Outcome: Progressing   Problem: Health Behavior/Discharge Planning: Goal: Ability to manage health-related needs will improve Outcome: Progressing   Problem: Clinical Measurements: Goal: Ability to maintain clinical measurements within normal limits will improve Outcome: Progressing Goal: Will remain free from infection Outcome: Progressing Goal: Diagnostic test results will improve Outcome: Progressing Goal: Respiratory complications will improve Outcome: Progressing Goal: Cardiovascular complication will be avoided Outcome: Progressing   Problem: Activity: Goal: Risk for activity intolerance will decrease Outcome: Progressing   Problem: Nutrition: Goal: Adequate nutrition will be maintained Outcome: Progressing   Problem: Coping: Goal: Level of anxiety will decrease Outcome: Progressing   Problem: Elimination: Goal: Will not experience complications related to bowel motility Outcome: Progressing Goal: Will not experience complications related to urinary retention Outcome: Progressing   Problem: Pain Managment: Goal: General experience of comfort will improve and/or be controlled Outcome: Progressing   Problem: Safety: Goal: Ability to remain free from injury will improve Outcome: Progressing   Problem: Safety: Goal: Ability to remain free from injury will improve Outcome: Progressing   "
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
## 2024-12-12 ENCOUNTER — Other Ambulatory Visit: Payer: Self-pay | Admitting: Obstetrics & Gynecology

## 2024-12-12 DIAGNOSIS — O26893 Other specified pregnancy related conditions, third trimester: Secondary | ICD-10-CM | POA: Diagnosis not present

## 2024-12-12 DIAGNOSIS — R7309 Other abnormal glucose: Secondary | ICD-10-CM

## 2024-12-12 DIAGNOSIS — Z3A28 28 weeks gestation of pregnancy: Secondary | ICD-10-CM | POA: Diagnosis not present

## 2024-12-12 LAB — GLUCOSE, CAPILLARY: Glucose-Capillary: 163 mg/dL — ABNORMAL HIGH (ref 70–99)

## 2024-12-12 MED ORDER — BUDESONIDE 90 MCG/ACT IN AEPB: 1.0000 | INHALATION_SPRAY | Freq: Two times a day (BID) | RESPIRATORY_TRACT | 2 refills | Status: DC

## 2024-12-12 MED ORDER — METFORMIN HCL 500 MG PO TABS: 500.0000 mg | ORAL_TABLET | Freq: Two times a day (BID) | ORAL | 2 refills | Status: AC

## 2024-12-12 MED ORDER — DEXTROMETHORPHAN POLISTIREX ER 30 MG/5ML PO SUER
30.0000 mg | Freq: Once | ORAL | Status: AC
  Administered 2024-12-12: 30 mg via ORAL

## 2024-12-12 MED ORDER — MONTELUKAST SODIUM 10 MG PO TABS: 10.0000 mg | ORAL_TABLET | Freq: Every day | ORAL | 2 refills | Status: AC

## 2024-12-12 MED ORDER — BENZONATATE 200 MG PO CAPS: 200.0000 mg | ORAL_CAPSULE | Freq: Three times a day (TID) | ORAL | 0 refills | Status: AC | PRN

## 2024-12-12 MED ORDER — HYDROCODONE BIT-HOMATROP MBR 5-1.5 MG/5ML PO SOLN: 5.0000 mL | Freq: Four times a day (QID) | ORAL | 0 refills | Status: AC | PRN

## 2024-12-12 NOTE — Progress Notes (Signed)
"  Blood sugar 119  "

## 2024-12-12 NOTE — Plan of Care (Signed)
" °  Problem: Education: Goal: Knowledge of General Education information will improve Description: Including pain rating scale, medication(s)/side effects and non-pharmacologic comfort measures Outcome: Completed/Met   Problem: Health Behavior/Discharge Planning: Goal: Ability to manage health-related needs will improve Outcome: Completed/Met   Problem: Clinical Measurements: Goal: Ability to maintain clinical measurements within normal limits will improve Outcome: Completed/Met Goal: Will remain free from infection Outcome: Completed/Met Goal: Diagnostic test results will improve Outcome: Completed/Met Goal: Respiratory complications will improve Outcome: Completed/Met Goal: Cardiovascular complication will be avoided Outcome: Completed/Met   Problem: Activity: Goal: Risk for activity intolerance will decrease Outcome: Completed/Met   Problem: Nutrition: Goal: Adequate nutrition will be maintained Outcome: Completed/Met   Problem: Coping: Goal: Level of anxiety will decrease Outcome: Completed/Met   Problem: Elimination: Goal: Will not experience complications related to bowel motility Outcome: Completed/Met Goal: Will not experience complications related to urinary retention Outcome: Completed/Met   Problem: Pain Managment: Goal: General experience of comfort will improve and/or be controlled Outcome: Completed/Met   Problem: Safety: Goal: Ability to remain free from injury will improve Outcome: Completed/Met   Problem: Skin Integrity: Goal: Risk for impaired skin integrity will decrease Outcome: Completed/Met   Problem: Education: Goal: Knowledge of disease or condition will improve Outcome: Completed/Met Goal: Knowledge of the prescribed therapeutic regimen will improve Outcome: Completed/Met Goal: Individualized Educational Video(s) Outcome: Completed/Met   Problem: Clinical Measurements: Goal: Complications related to the disease process, condition or  treatment will be avoided or minimized Outcome: Completed/Met   Problem: Education: Goal: Ability to describe self-care measures that may prevent or decrease complications (Diabetes Survival Skills Education) will improve Outcome: Completed/Met Goal: Individualized Educational Video(s) Outcome: Completed/Met   Problem: Coping: Goal: Ability to adjust to condition or change in health will improve Outcome: Completed/Met   Problem: Fluid Volume: Goal: Ability to maintain a balanced intake and output will improve Outcome: Completed/Met   Problem: Health Behavior/Discharge Planning: Goal: Ability to identify and utilize available resources and services will improve Outcome: Completed/Met Goal: Ability to manage health-related needs will improve Outcome: Completed/Met   Problem: Metabolic: Goal: Ability to maintain appropriate glucose levels will improve Outcome: Completed/Met   Problem: Nutritional: Goal: Maintenance of adequate nutrition will improve Outcome: Completed/Met Goal: Progress toward achieving an optimal weight will improve Outcome: Completed/Met   Problem: Skin Integrity: Goal: Risk for impaired skin integrity will decrease Outcome: Completed/Met   Problem: Tissue Perfusion: Goal: Adequacy of tissue perfusion will improve Outcome: Completed/Met   "

## 2024-12-12 NOTE — Progress Notes (Signed)
 D/t limited cafeteria menu and pt on a gest carb mod diet, pt given fish for dinner, that of which she does not eat. Pt allowed a regular diet overnight and a new tray ordered, however, tray never arrived and cafeteria closed. AC notified and a [employer] gift card provided to pt for dinner. Pt ordered broccoli soup with baguette and programed insulin  pump accordingly.
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
## 2024-12-15 ENCOUNTER — Encounter: Admitting: Certified Nurse Midwife

## 2024-12-15 ENCOUNTER — Telehealth: Payer: Self-pay | Admitting: *Deleted

## 2024-12-15 NOTE — Telephone Encounter (Signed)
-----   Message from Nurse Silvano SQUIBB, RN sent at 12/07/2024  4:54 PM EST ----- Regarding: next appointment to be with MD Hey! Denise [name] would like patient to be schedule with Dr. Cleatus or Dr. Erik for next prenatal appointment. Denise reported would still see her for DM.

## 2024-12-15 NOTE — Telephone Encounter (Signed)
Left patient a message to call and schedule. 

## 2024-12-21 ENCOUNTER — Ambulatory Visit: Admitting: Obstetrics and Gynecology

## 2024-12-21 VITALS — BP 125/80 | HR 93 | Wt 264.0 lb

## 2024-12-21 DIAGNOSIS — J189 Pneumonia, unspecified organism: Secondary | ICD-10-CM

## 2024-12-21 DIAGNOSIS — I10 Essential (primary) hypertension: Secondary | ICD-10-CM

## 2024-12-21 DIAGNOSIS — Z3A30 30 weeks gestation of pregnancy: Secondary | ICD-10-CM | POA: Insufficient documentation

## 2024-12-21 DIAGNOSIS — O368131 Decreased fetal movements, third trimester, fetus 1: Secondary | ICD-10-CM

## 2024-12-21 DIAGNOSIS — O368130 Decreased fetal movements, third trimester, not applicable or unspecified: Secondary | ICD-10-CM | POA: Insufficient documentation

## 2024-12-21 DIAGNOSIS — O24113 Pre-existing diabetes mellitus, type 2, in pregnancy, third trimester: Secondary | ICD-10-CM

## 2024-12-21 DIAGNOSIS — O0990 Supervision of high risk pregnancy, unspecified, unspecified trimester: Secondary | ICD-10-CM
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
## 2024-12-29 ENCOUNTER — Encounter: Admitting: Certified Nurse Midwife

## 2025-01-04 ENCOUNTER — Encounter: Admitting: Obstetrics and Gynecology

## 2025-01-05 ENCOUNTER — Encounter: Admitting: Obstetrics & Gynecology

## 2025-01-12 ENCOUNTER — Encounter: Admitting: Certified Nurse Midwife

## 2025-01-13 ENCOUNTER — Ambulatory Visit: Admitting: Allergy & Immunology

## 2025-01-14 ENCOUNTER — Ambulatory Visit

## 2025-01-18 ENCOUNTER — Encounter: Admitting: Obstetrics and Gynecology

## 2025-01-21 ENCOUNTER — Ambulatory Visit

## 2025-01-26 ENCOUNTER — Encounter: Admitting: Certified Nurse Midwife

## 2025-01-28 ENCOUNTER — Other Ambulatory Visit

## 2025-01-28 ENCOUNTER — Ambulatory Visit

## 2025-02-01 ENCOUNTER — Encounter: Admitting: Obstetrics and Gynecology

## 2025-02-04 ENCOUNTER — Ambulatory Visit

## 2025-02-04 ENCOUNTER — Other Ambulatory Visit

## 2025-02-09 ENCOUNTER — Encounter: Admitting: Certified Nurse Midwife

## 2025-02-23 ENCOUNTER — Encounter: Admitting: Certified Nurse Midwife
